# Patient Record
Sex: Female | Born: 1980 | Race: White | Hispanic: No | Marital: Single | Smoking: Former smoker
Health system: Southern US, Community
[De-identification: ages and names within clinical notes are randomized; demographics above are authoritative.]

## PROBLEM LIST (undated history)

## (undated) DIAGNOSIS — F32A Depression, unspecified: Secondary | ICD-10-CM

## (undated) DIAGNOSIS — F419 Anxiety disorder, unspecified: Secondary | ICD-10-CM

## (undated) DIAGNOSIS — K529 Noninfective gastroenteritis and colitis, unspecified: Secondary | ICD-10-CM

## (undated) DIAGNOSIS — D734 Cyst of spleen: Secondary | ICD-10-CM

## (undated) DIAGNOSIS — N83209 Unspecified ovarian cyst, unspecified side: Secondary | ICD-10-CM

## (undated) DIAGNOSIS — N289 Disorder of kidney and ureter, unspecified: Secondary | ICD-10-CM

## (undated) DIAGNOSIS — F329 Major depressive disorder, single episode, unspecified: Secondary | ICD-10-CM

## (undated) HISTORY — PX: OTHER SURGICAL HISTORY: SHX169

## (undated) HISTORY — PX: BACK SURGERY: SHX140

## (undated) HISTORY — PX: TUBAL LIGATION: SHX77

## (undated) HISTORY — PX: ABDOMINAL HYSTERECTOMY: SHX81

---

## 2005-02-18 ENCOUNTER — Other Ambulatory Visit: Admission: RE | Admit: 2005-02-18 | Discharge: 2005-02-18 | Payer: Self-pay | Admitting: Obstetrics and Gynecology

## 2005-08-18 ENCOUNTER — Inpatient Hospital Stay (HOSPITAL_COMMUNITY): Admission: AD | Admit: 2005-08-18 | Discharge: 2005-08-22 | Payer: Self-pay | Admitting: Obstetrics and Gynecology

## 2005-09-29 ENCOUNTER — Other Ambulatory Visit: Admission: RE | Admit: 2005-09-29 | Discharge: 2005-09-29 | Payer: Self-pay | Admitting: Obstetrics and Gynecology

## 2006-12-17 ENCOUNTER — Encounter: Admission: RE | Admit: 2006-12-17 | Discharge: 2006-12-17 | Payer: Self-pay | Admitting: Obstetrics and Gynecology

## 2008-11-11 ENCOUNTER — Inpatient Hospital Stay (HOSPITAL_COMMUNITY): Admission: AD | Admit: 2008-11-11 | Discharge: 2008-11-11 | Payer: Self-pay | Admitting: Obstetrics and Gynecology

## 2008-11-20 ENCOUNTER — Inpatient Hospital Stay (HOSPITAL_COMMUNITY): Admission: AD | Admit: 2008-11-20 | Discharge: 2008-11-23 | Payer: Self-pay | Admitting: Obstetrics and Gynecology

## 2010-02-18 LAB — HM PAP SMEAR: HM Pap smear: NEGATIVE

## 2011-01-13 LAB — CBC
HCT: 29.3 % — ABNORMAL LOW (ref 36.0–46.0)
HCT: 38.5 % (ref 36.0–46.0)
MCHC: 34.8 g/dL (ref 30.0–36.0)
MCHC: 35.3 g/dL (ref 30.0–36.0)
Platelets: 116 10*3/uL — ABNORMAL LOW (ref 150–400)
Platelets: 174 10*3/uL (ref 150–400)
RBC: 2.88 MIL/uL — ABNORMAL LOW (ref 3.87–5.11)
RBC: 3.82 MIL/uL — ABNORMAL LOW (ref 3.87–5.11)
RDW: 13.5 % (ref 11.5–15.5)
RDW: 13.6 % (ref 11.5–15.5)
WBC: 8.9 10*3/uL (ref 4.0–10.5)

## 2011-01-13 LAB — TYPE AND SCREEN
ABO/RH(D): A POS
Antibody Screen: NEGATIVE

## 2011-01-13 LAB — ABO/RH: ABO/RH(D): A POS

## 2011-01-13 LAB — RPR: RPR Ser Ql: NONREACTIVE

## 2011-02-10 NOTE — Op Note (Signed)
[name], Lindsey Sampson NO.:  [id number]   MEDICAL RECORD NO.:  [id number]          PATIENT TYPE:  INP   LOCATION:  9132                          FACILITY:  [facility]   PHYSICIAN:  Duke Salvia. Marcelle Overlie, M.D.DATE OF BIRTH:  25-May-1981   DATE OF PROCEDURE:  11/20/2008  DATE OF DISCHARGE:                               OPERATIVE REPORT   PREOPERATIVE DIAGNOSES:  1. Term intrauterine pregnancy.  2. Previous cesarean section.  3. Requests permanent sterilization.   POSTOPERATIVE DIAGNOSES:  1. Term intrauterine pregnancy.  2. Previous cesarean section.  3. Requests permanent sterilization.   PROCEDURES:  1. Repeat low transverse cesarean section.  2. Filshie clip tubal ligation.   SURGEON:  Duke Salvia. Marcelle Overlie, MD   ANESTHESIA:  Spinal.   COMPLICATIONS:  None.   DRAINS:  Foley catheter.   ESTIMATED BLOOD LOSS:  800 mL.   PROCEDURE AND FINDINGS:  The patient was taken to the operating room  after an adequate level of spinal anesthetic was obtained.  With the  patient in left tilt position, the abdomen was prepped and draped in  usual manner for sterile procedures.  Foley catheter was positioned  draining clear urine.  Transverse Pfannenstiel incision was made through  the old scar which was well healed.  This was carried down the fascia,  which was incised and extended transversely.  Rectus muscle divided in  midline.  Peritoneum entered superiorly without incident and extended  vertically.  Bladder blade was positioned.  The vesicouterine serosa was  then incised and the bladder was sharply and bluntly dissected below.  Transverse incision made in the lower segment which was moderately thin,  extended with blunt dissection, clear fluid noted.  The patient  delivered of a female Apgars 9 and 9 and 8 pounds 7 ounces.  The infant  was suctioned, cord clamped, and passed to pediatric team for further  care.  Placenta was delivered manually intact, uterus exteriorized,  cavity wiped clean with laparotomy pack, closure obtained with first  layer of 0 chromic in a locked fashion followed by an imbricating layer  of 0 chromic.  The incision was hemostatic.  The bladder flap area was  intact and hemostatic.  Tubes and ovaries were normal bilaterally.  Filshie clip was applied at the right angle, 2-3 cm from the cornu on  each side, completely engulfing the tube with excellent application.  Prior to closure, sponge, needle, and instrument counts reported as  correct x2.  Peritoneum closed with  running 2-0 Vicryl suture.  Rectus muscles were reapproximated in the  midline with 2-0 Vicryl interrupted sutures.  Fascia closed from  laterally to midline on either side with a 0 PDS suture.  Subcutaneous  tissue was hemostatic.  Clips and Steri-Strips used on the skin.  She  tolerated this well and went to recovery room in good condition.      Richard M. Marcelle Overlie, M.D.  Electronically Signed     RMH/MEDQ  D:  11/20/2008  T:  11/21/2008  Job:  [id number]

## 2011-02-10 NOTE — Discharge Summary (Signed)
Lindsey, Sampson NO.:  [id number]   MEDICAL RECORD NO.:  [id number]          PATIENT TYPE:  INP   LOCATION:  9132                          FACILITY:  [facility]   PHYSICIAN:  Duke Salvia. Marcelle Overlie, M.D.DATE OF BIRTH:  September 22, 1981   DATE OF ADMISSION:  11/20/2008  DATE OF DISCHARGE:  11/23/2008                               DISCHARGE SUMMARY   DIAGNOSES:  1. Intrauterine pregnancy at term.  2. Previous cesarean section, desires repeat.  3. Multiparity, desires permanent sterilization.   DISCHARGE DIAGNOSES:  1. Status post low transverse cesarean section.  2. A viable female infant.   PROCEDURE:  1. Repeat low transverse cesarean section.  2. Bilateral tubal ligation.   REASON FOR ADMISSION:  Please see written H and P.   HOSPITAL COURSE:  The patient is a 30 year old, gravida 2, para 1, that  was admitted to [facility] for scheduled cesarean  section.  The patient had had a previous cesarean delivery and desired  repeat.  Due to multiparity, the patient also requested permanent  sterilization.  In the morning of admission, the patient was taken to  the operating room, where spinal anesthesia was administered without  difficulty.  A low transverse incision was made with delivery of a  viable female infant, weighing 8 pounds 7 ounces with Apgars of 9 at 1-  minute and 9 at 5 minutes.  The patient tolerated the procedure well and  was taken to the recovery room in stable condition.  On postoperative  day #1, the patient was without complaint.  Vital signs stable.  She was  afebrile.  Fundus was firm and nontender.  Abdominal dressings noted to  be clean, dry, and intact.  On postoperative day #2, the patient was  without complaint.  Baby was in the NICU with respiratory difficulty  with O2 sats being lower.  Vital signs were stable.  She was afebrile.  Blood pressure was 110/69 to 98/64.  Abdomen was soft.  Fundus was firm  and nontender.  Incision  was clean, dry, and intact.  Laboratory  findings showed hemoglobin of 10.3, platelet count 116,000, blood type  was known to be A positive.  On postoperative day #3, the patient was  without complaint.  She anticipated baby discharge.  Vital signs were  stable.  She was afebrile.  Fundus was firm and nontender.  Incision was  clean, dry, and intact.  Staples were removed.  The patient was later  discharged to home.   CONDITION ON DISCHARGE:  Stable.   DIET:  Regular as tolerated.   ACTIVITY:  No heavy lifting, no driving x2 weeks, and no vaginal entry.   FOLLOWUP:  The patient is to follow up in the office in 1-2 weeks for  incision check.  She is to call for temperature greater than 100  degrees, persistent nausea, vomiting, heavy vaginal bleeding, and/or  redness, or drainage from the incisional site.   DISCHARGE MEDICATIONS:  1. Tylox #30 one p.o. every 4-6 hours p.r.n.  2. Motrin 600 mg every 6 hours.  3. Prenatal vitamins 1 p.o. daily.  4. Colace 1 p.o. daily.      Julio Sicks, N.P.      Richard M. Marcelle Overlie, M.D.  Electronically Signed    CC/MEDQ  D:  11/23/2008  T:  11/23/2008  Job:  [id number]

## 2011-02-10 NOTE — H&P (Signed)
NAMEJAYLENN, BAIZA NO.:  [id number]   MEDICAL RECORD NO.:  [id number]          PATIENT TYPE:  INP   LOCATION:  NA                            FACILITY[facility]  [facility]   PHYSICIAN:  Duke Salvia. Marcelle Overlie, M.D.DATE OF BIRTH:  15-Nov-1980   DATE OF ADMISSION:  11/20/2008  DATE OF DISCHARGE:                              HISTORY & PHYSICAL   CHIEF COMPLAINT:  For repeat cesarean section at term.   HPI:  A 30 year old G2, P1, previous cesarean section in 2006.  EDD is  November 26, 2008.  Presents at 39 weeks for repeat cesarean section.  This  procedure including risks related to bleeding, infection, adjacent organ  injury, transfusion all reviewed with her which she understands and  accepts.   Blood type is A+.  Her 1-hour GTT was normal.  Prenatal course has  otherwise been uncomplicated.   PAST MEDICAL HISTORY:   ALLERGIES:  NONE.   OPERATIONS:  Cesarean section.   REVIEW OF SYSTEMS:  Significant for a prior history of smoking.   FAMILY HISTORY:  Significant for a mother with lupus.   PHYSICAL EXAM:  Temp 98.2.  Blood pressure 120/78.  HEENT:  Unremarkable.  NECK:  Supple without masses.  LUNGS:  Clear.  CARDIOVASCULAR:  Rate and rhythm without murmurs, rubs, or gallops  noted.  BREASTS:  Not examined.  Term fundal height.  Fetal heart rate 140.  Cervix was closed.  EXTREMITIES/NEUROLOGIC EXAM:  Unremarkable.   IMPRESSION:  Term intrauterine pregnancy, previous cesarean section,  desires repeat and tubal ligation.  This procedure including permanence  of the tubal procedure, failure rated 2 to 3 per thousand all reviewed  with her which she understands and accepts along with additional risk as  noted as above.      Richard M. Marcelle Overlie, M.D.  Electronically Signed     RMH/MEDQ  D:  11/19/2008  T:  11/19/2008  Job:  [id number]

## 2011-02-13 NOTE — Discharge Summary (Signed)
Lindsey Sampson, Lindsey Sampson               ACCOUNT NO.:  [id number]   MEDICAL RECORD NO.:  [id number]          PATIENT TYPE:  INP   LOCATION:  9132                          [facility]  [facility]   PHYSICIAN:  Dineen Kid. Rana Snare, M.D.    DATE OF BIRTH:  [birth year]   DATE OF ADMISSION:  08/18/2005  DATE OF DISCHARGE:  08/22/2005                                 DISCHARGE SUMMARY   ADMITTING DIAGNOSES:  1.  Intrauterine pregnancy at 37 weeks estimated gestational age.  2.  Spontaneous onset of labor.   DISCHARGE DIAGNOSES:  1.  Status post low transverse cesarean section secondary to failure to      descend.  2.  Viable female infant.   PROCEDURE:  Primary low transverse cesarean section.   REASON FOR ADMISSION:  Please see written H&P.   HOSPITAL COURSE:  The patient was a 30 year old primigravida that was  admitted to [facility] at 37 weeks estimated gestational  age in active labor. Prenatal course had been uncomplicated. The patient was  noted to be positive for group B beta strep. On admission vital signs were  stable. Fetal heart tones reactive. Contraction pattern was noted to be  approximately every 2-4 minutes. Cervix was dilated to 4 cm, 80% effaced,  vertex at -2 station. IV antibiotics were administered for positive group B  beta strep culture. Artificial rupture of membranes was performed revealing  clear fluid. The patient was monitored closely throughout the day and on the  following morning the patient had noted to be in a good labor pattern;  however, cervix continued to be dilated to 4 cm, 80%, at a -2 station.  Vertex was noted to have molding and decision was made to proceed with a low  transverse cesarean section for failure to progress. The patient was then  transferred to the operating room where epidural was dosed to an adequate  surgical level. A low transverse incision was made with the delivery of a  viable female infant weighing 6 pounds 8 ounces with Apgars of  8 at one minute  and 9 at five minutes. The patient tolerated the procedure well and was  taken to the recovery room in stable condition. On postoperative day #1 the  patient was without complaint. Vital signs were stable. Abdomen was soft,  slightly distended, with positive bowel sounds. Abdominal dressing was noted  to be clean, dry and intact. Laboratory findings revealed hemoglobin of 9.3;  platelet count of 148,000; wbc count of 9.0. On postoperative day #2 the  patient was without complaint. Vital signs remained stable. She was  afebrile. Abdomen was soft, fundus firm and nontender. Abdominal dressing  had been removed revealing an incision that was clean, dry and intact. On  postoperative day #3 the patient did complain of some lower extremity edema.  Vital signs were stable. Deep tendon reflexes were 2+ without clonus, 2+  pitting edema was noted in bilateral lower extremities. Incision was clean,  dry and intact. Staples were removed and the patient was discharged home.   CONDITION ON DISCHARGE:  Stable.   DIET:  Regular as tolerated.   ACTIVITY:  No heavy lifting, no driving x2 weeks, no vaginal entry.   FOLLOW UP:  The patient is to follow up in the office in 1 week for incision  check. She is to call for temperature greater than 100 degrees, persistent  nausea or vomiting, heavy vaginal bleeding, and/or redness or drainage from  incisional site. The patient was also instructed call for headache, blurred  vision or right upper quadrant pain.   DISCHARGE MEDICATIONS:  1.  Tylox #30 one p.o. every 4-6 hours p.r.n.  2.  Lasix 20 mg one p.o. daily x3 days.  3.  Motrin 600 mg every 6 hours.  4.  Prenatal vitamins one p.o. daily.      Julio Sicks, N.P.      Dineen Kid Rana Snare, M.D.  Electronically Signed    CC/MEDQ  D:  10/09/2005  T:  10/09/2005  Job:  [id number]

## 2011-02-13 NOTE — Op Note (Signed)
NAMEJULEAH, Lindsey Sampson               ACCOUNT NO.:  [id number]   MEDICAL RECORD NO.:  [id number]          PATIENT TYPE:  INP   LOCATION:  9132                          [facility]ACILITY[facility]   PHYSICIAN:  Michelle L. Grewal, M.D.DATE OF BIRTH:  [birth year]   DATE OF PROCEDURE:  08/19/2005  DATE OF DISCHARGE:                                 OPERATIVE REPORT   PREOPERATIVE DIAGNOSIS:  Intrauterine pregnancy at term with failure to  progress.   POSTOPERATIVE DIAGNOSIS:  Intrauterine pregnancy at term with failure to  progress.   PROCEDURE:  Primary low transverse cesarean section.   SURGEON:  Michelle L. Vincente Poli, M.D.   ANESTHESIA:  Post anesthesia epidural.   SPECIMEN:  Female infant Apgars 8 at one minute and 9 at five minutes.   ESTIMATED BLOOD LOSS:  500 mL.   PROCEDURE:  Patient was taken to the operating room. She was dosed and found  to be adequate. She was prepped and draped. Foley catheter was already in  the bladder. A low transverse incision was made and carried down to the  fascia.  The fascia was scored in midline. The rectus muscles were separated  midline and peritoneum was entered bluntly. Peritoneal incision was then  stretched and lower uterine segment identified and the bladder flap was  created sharply and then digitally. The bladder blade was readjusted. Low  transverse incision was made and the uterus was entered using the hemostat.  The baby was delivered quite easily and was a female infant with Apgars of 8  at one minute and 9 at five minutes __________. The cord was clamped and  cut. The baby was handed to the awaiting neonatologist and the baby was then  taken to newborn nursery. The placenta was manually removed and noted to be  normal and intact with a three-vessel cord. The uterus was cleared of all  clots and debris. There was some small subserosal fibroids noted. The adnexa  were normal. The uterine incision was closed using 0 Vicryl in continuous  running stitch  and the hemostasis was noted. The irrigation was performed.  Hemostasis was noted. The peritoneum was closed using 0 Vicryl. The rectus  muscles were reapproximated and the fascia was closed using 0 Vicryl in  continuous running stitch. After irrigation the subcu was noted to be  hemostatic. The skin was closed with staples. All sponge, lap and instrument  counts were correct x2. The patient tolerated procedure well and went to  recovery room in stable condition.      Michelle L. Vincente Poli, M.D.  Electronically Signed     MLG/MEDQ  D:  08/19/2005  T:  08/20/2005  Job:  [id number]

## 2011-11-21 ENCOUNTER — Emergency Department: Payer: Self-pay | Admitting: Emergency Medicine

## 2012-12-02 ENCOUNTER — Emergency Department: Payer: Self-pay | Admitting: Emergency Medicine

## 2014-08-06 ENCOUNTER — Emergency Department (HOSPITAL_COMMUNITY): Payer: Medicaid Other

## 2014-08-06 ENCOUNTER — Emergency Department (HOSPITAL_COMMUNITY)
Admission: EM | Admit: 2014-08-06 | Discharge: 2014-08-06 | Disposition: A | Discharge status: Home / Self Care | Payer: Medicaid Other | Attending: Emergency Medicine | Admitting: Emergency Medicine

## 2014-08-06 ENCOUNTER — Encounter (HOSPITAL_COMMUNITY): Payer: Self-pay | Admitting: Emergency Medicine

## 2014-08-06 DIAGNOSIS — S3992XA Unspecified injury of lower back, initial encounter: Secondary | ICD-10-CM | POA: Insufficient documentation

## 2014-08-06 DIAGNOSIS — S199XXA Unspecified injury of neck, initial encounter: Secondary | ICD-10-CM | POA: Insufficient documentation

## 2014-08-06 DIAGNOSIS — Y92410 Unspecified street and highway as the place of occurrence of the external cause: Secondary | ICD-10-CM | POA: Insufficient documentation

## 2014-08-06 DIAGNOSIS — S6992XA Unspecified injury of left wrist, hand and finger(s), initial encounter: Secondary | ICD-10-CM | POA: Diagnosis not present

## 2014-08-06 DIAGNOSIS — Y998 Other external cause status: Secondary | ICD-10-CM | POA: Insufficient documentation

## 2014-08-06 DIAGNOSIS — Y9389 Activity, other specified: Secondary | ICD-10-CM | POA: Insufficient documentation

## 2014-08-06 MED ORDER — IBUPROFEN 600 MG PO TABS
600.0000 mg | ORAL_TABLET | Freq: Four times a day (QID) | ORAL | Status: DC | PRN
Start: 2014-08-06 — End: 2016-02-12

## 2014-08-06 MED ORDER — CYCLOBENZAPRINE HCL 10 MG PO TABS
10.0000 mg | ORAL_TABLET | Freq: Two times a day (BID) | ORAL | Status: DC | PRN
Start: 2014-08-06 — End: 2016-02-12

## 2014-08-06 MED ORDER — HYDROCODONE-ACETAMINOPHEN 5-325 MG PO TABS: 2.0000 | ORAL_TABLET | ORAL | Status: DC | PRN

## 2014-08-06 MED ORDER — HYDROCODONE-ACETAMINOPHEN 5-325 MG PO TABS
2.0000 | ORAL_TABLET | Freq: Once | ORAL | Status: AC
  Administered 2014-08-06: 2 via ORAL
  Filled 2014-08-06: qty 2

## 2014-08-06 NOTE — Discharge Instructions (Signed)
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
## 2014-12-12 ENCOUNTER — Ambulatory Visit: Payer: Self-pay | Admitting: Obstetrics and Gynecology

## 2014-12-17 ENCOUNTER — Ambulatory Visit: Payer: Self-pay | Admitting: Obstetrics and Gynecology

## 2015-01-21 LAB — SURGICAL PATHOLOGY

## 2015-01-27 NOTE — Op Note (Signed)
PATIENT NAME:  Lindsey Sampson, Lindsey Sampson MR#:  [id number] DATE OF BIRTH:  October 16, 1980  DATE OF PROCEDURE:  12/17/2014  PREOPERATIVE DIAGNOSES:  1.  Persistent right ovarian cyst.  2.  History tubal ligation, desiring tubal reversal.  POSTOPERATIVE DIAGNOSES:    1.  Persistent right ovarian cyst.  2.  History tubal ligation, desiring tubal reversal. 3.  Noted dislodged right Filshie clip. 4.  Also a few small bowel adhesions to the left adnexa.   OPERATION: Laparoscopic right ovarian cystectomy, retrieval of misplaced right Filshie clip and lysis of adhesions.   ANESTHESIA: General.   SURGEON: Rubie Maid, MD   ASSISTANT: Malachi Paradise, MD.   ESTIMATED BLOOD LOSS: Minimal.   OPERATIVE FLUIDS: 1000 mL.   URINE OUTPUT: 400 mL.   COMPLICATIONS: None.   FINDINGS: The uterus was sounded to 8 cm.  On laparoscopy, there were noted to be 2 right ovarian cysts; 1 was medium-sized, rupture was seen in light fluid containing hair, was approximately 4 cm in size and the other was a small simple cyst approximately 2 cm filled with serous fluid that was noted on aspiration.  There was also a Filshie clip noted to be missing from the right fallopian tube.  On survey of the abdomen, the Filshie clip was seen in the posterior cul-de-sac in a  defect noted along the left uterosacral ligament. The left fallopian tube was noted to have Filshie clip in place, otherwise normal-appearing fallopian tubes and normal left ovary. There were a few filmy adhesions of the bowel to the left adnexa.   SPECIMEN: Right ovarian cyst and the Filshie clip that was noted in the uterosacral ligament defect.   CONDITION: Stable.   PROCEDURE: The patient was taken to the Operating Room where she was placed under general anesthesia without difficulty. She was then placed in the dorsal lithotomy position and prepped and draped in normal sterile fashion for a laparoscopic procedure. Timeout was performed. A straight  catheterization was performed with a return of 400 mL of clear urine. Next, a sterile speculum was placed into the patient's vagina. A single-tooth tenaculum was used to grasp the anterior lip of the cervix.  Cervix was then sounded to 8 cm.  Next, the tenaculum was removed and a Hulka clamp was placed for uterine manipulation. At this time, attention was turned to the abdomen where an infraumbilical vertical incision was injected with 0.5% Sensorcaine with epinephrine and a skin incision was made.  Next, a 5 mm trocar with sheath was inserted into the patient's abdominal cavity under direct visualization using the laparoscope. Once entrance into the abdominal cavity had been confirmed, the abdomen was insufflated with carbon dioxide. After adequate insufflation, a survey of the abdomen was performed with the findings noted above. The upper abdomen was noted to be free of adhesions. Next, a 10 mm incision was made on the patient's right lower aspect of the abdomen.  Skin incision was injected with 0.5% Sensorcaine with epinephrine and a 10 mm trocar was introduced into the abdominal cavity for instrumentation under direct visualization. The same procedure was carried out on the patient's left side where a 5 mm skin incision was made and a 5 mm trocar was introduced, again under direct visualization. The uterus appeared to be normal. All other findings were noted above.  The cul-de-sac was clean without evidence of endometriosis, scarring or adhesions.   At this time, the right ovary was grasped and elevated at the level of the right fallopian tube and  a second grasper was used to offer counter-traction of the ovary where the cyst was incised using the Harmonic scalpel device to separate the ovarian capsule from the cyst wall. After linear incision was made, the capsule was teased away from ovarian cyst.  During the time of separation of these 2 layers, entrance was made into the cyst where sebaceous material was  noted.  Once the cyst had been ruptured, the suction irrigator was then inserted into the ovarian cyst cavity, and the remaining cyst fluid was suctioned.  Once the cyst had been drained, the ovarian capsule continued to be teased away from the cyst wall until the cyst had been completely dissected from the ovary.  Next, the ovarian cyst was placed into an Endo Catch bag and this bag was removed from the abdomen through the 10 mm trocar site. The specimen was then collected and sent to pathology for evaluation. The 10 mm trocar was then replaced into the patient's abdominal cavity, again under direct visualization. The remaining ovarian tissue was then surveyed.  It was noted that there was a second smaller cyst below the initial first cyst, as this was again aspirated with noted clear or serous fluid which drained.  After this, copious irrigation of the abdomen was performed with suction of the fluid.  Attention was turned to the left fallopian tube and ovary where there were noted to be a few filmy bowel adhesions. These adhesions were taken down using the Harmonic device.  The misplaced right Filshie clip was then retrieved from the defect noted in the uterosacral ligament and removed from the patient's abdomen. The left Filshie clip was noted to be in place. After this, the right ovary was then surveyed once more and was noted to be hemostatic. The lateral trocars were removed from the patient's abdomen.  The CO2 gas was allowed to express. After the gas had been expressed, the infraumbilical port was then removed. The 10 mm incision site was then closed using 4-0 Vicryl. All other incisions were closed using LiquiBand. The patient tolerated the procedure well. The Hulka clamp was removed from the patient's vagina. She was then awakened and taken to the PACU in stable condition.     _____________________________ Chesley Noon Marcelline Mates, MD asc:DT D: 12/17/2014 22:09:38 ET T: 12/18/2014 08:47:51  ET JOB#: [id number]  cc: Chesley Noon. Marcelline Mates, MD, <Dictator> Augusto Gamble MD ELECTRONICALLY SIGNED 01/07/2015 11:18

## 2015-05-13 ENCOUNTER — Emergency Department: Payer: Medicaid Other

## 2015-05-13 ENCOUNTER — Emergency Department
Admission: EM | Admit: 2015-05-13 | Discharge: 2015-05-13 | Disposition: A | Discharge status: Home / Self Care | Payer: Medicaid Other | Attending: Emergency Medicine | Admitting: Emergency Medicine

## 2015-05-13 DIAGNOSIS — Y9289 Other specified places as the place of occurrence of the external cause: Secondary | ICD-10-CM | POA: Diagnosis not present

## 2015-05-13 DIAGNOSIS — S93509A Unspecified sprain of unspecified toe(s), initial encounter: Secondary | ICD-10-CM

## 2015-05-13 DIAGNOSIS — Y9371 Activity, boxing: Secondary | ICD-10-CM | POA: Diagnosis not present

## 2015-05-13 DIAGNOSIS — S93501A Unspecified sprain of right great toe, initial encounter: Secondary | ICD-10-CM | POA: Insufficient documentation

## 2015-05-13 DIAGNOSIS — X58XXXA Exposure to other specified factors, initial encounter: Secondary | ICD-10-CM | POA: Insufficient documentation

## 2015-05-13 DIAGNOSIS — S99921A Unspecified injury of right foot, initial encounter: Secondary | ICD-10-CM | POA: Diagnosis present

## 2015-05-13 DIAGNOSIS — S90112A Contusion of left great toe without damage to nail, initial encounter: Secondary | ICD-10-CM | POA: Insufficient documentation

## 2015-05-13 DIAGNOSIS — S9031XA Contusion of right foot, initial encounter: Secondary | ICD-10-CM | POA: Insufficient documentation

## 2015-05-13 DIAGNOSIS — S99922A Unspecified injury of left foot, initial encounter: Secondary | ICD-10-CM | POA: Diagnosis not present

## 2015-05-13 DIAGNOSIS — Y998 Other external cause status: Secondary | ICD-10-CM | POA: Insufficient documentation

## 2015-05-13 MED ORDER — HYDROCODONE-ACETAMINOPHEN 5-325 MG PO TABS: 1.0000 | ORAL_TABLET | ORAL | Status: DC | PRN

## 2015-05-13 NOTE — ED Notes (Addendum)
Anterior right  foot ecchymosis after pt was using a kick bag on saturday

## 2015-05-13 NOTE — ED Notes (Signed)
Pt states she walked into a kick boxing bag on Sat. States the pain is worsening. Right toe pain.
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
## 2015-05-13 NOTE — ED Provider Notes (Signed)
[facility] Emergency Department Provider Note  ____________________________________________  Time seen: Approximately 2:52 PM  I have reviewed the triage vital signs and the nursing notes.   HISTORY  Chief Complaint Toe Pain    HPI Lindsey Sampson is a 34 y.o. female who presents to the emergency department for evaluation of right foot pain. She states that she was learning to kickbox on Saturday anddid it wrong. She states the pain is been worsening over the past 24 hours and she is unable to bend her great toe.   No past medical history on file.  There are no active problems to display for this patient.   No past surgical history on file.  Current Outpatient Rx  Name  Route  Sig  Dispense  Refill  . cyclobenzaprine (FLEXERIL) 10 MG tablet   Oral   Take 1 tablet (10 mg total) by mouth 2 (two) times daily as needed for muscle spasms.   20 tablet   0   . HYDROcodone-acetaminophen (NORCO/VICODIN) 5-325 MG per tablet   Oral   Take 1 tablet by mouth every 4 (four) hours as needed.   12 tablet   0   . ibuprofen (ADVIL,MOTRIN) 600 MG tablet   Oral   Take 1 tablet (600 mg total) by mouth every 6 (six) hours as needed.   30 tablet   0     Allergies Review of patient's allergies indicates no known allergies.  No family history on file.  Social History Social History  Substance Use Topics  . Smoking status: Passive Smoke Exposure - Never Smoker  . Smokeless tobacco: Not on file  . Alcohol Use: Not on file    Review of Systems Constitutional: No recent illness. Eyes: No visual changes. ENT: No sore throat. Cardiovascular: Denies chest pain or palpitations. Respiratory: Denies shortness of breath. Gastrointestinal: No abdominal pain.  Genitourinary: Negative for dysuria. Musculoskeletal: Pain in left great toe and foot. Skin: Negative for rash. Neurological: Negative for headaches, focal weakness or numbness. 10-point ROS otherwise  negative.  ____________________________________________   PHYSICAL EXAM:  VITAL SIGNS: ED Triage Vitals  Enc Vitals Group     BP 05/13/15 1349 135/114 mmHg     Pulse Rate 05/13/15 1349 75     Resp 05/13/15 1349 18     Temp 05/13/15 1349 98.3 F (36.8 C)     Temp Source 05/13/15 1349 Oral     SpO2 05/13/15 1349 95 %     Weight 05/13/15 1349 142 lb (64.411 kg)     Height 05/13/15 1349 5\' 4"  (1.626 m)     Head Cir --      Peak Flow --      Pain Score --      Pain Loc --      Pain Edu? --      Excl. in [place]? --     Constitutional: Alert and oriented. Well appearing and in no acute distress. Eyes: Conjunctivae are normal. EOMI. Head: Atraumatic. Nose: No congestion/rhinnorhea. Neck: No stridor.  Respiratory: Normal respiratory effort.   Musculoskeletal: Tenderness to palpation over the MCP joint of the left great toe. Tenderness also noted in the metatarsals of the first and second digits. Patient unwilling to attempt range of motion of the left great toe due to pain. Neurologic:  Normal speech and language. No gross focal neurologic deficits are appreciated. Speech is normal. No gait instability. Skin:  Skin is warm, dry and intact. Ecchymosis noted to the left  great toe and dorsal aspect of the left foot. Psychiatric: Mood and affect are normal. Speech and behavior are normal.  ____________________________________________   LABS (all labs ordered are listed, but only abnormal results are displayed)  Labs Reviewed - No data to display ____________________________________________  RADIOLOGY  Right foot negative for acute injury.  I, Sherrie George, personally viewed and evaluated these images (plain radiographs) as part of my medical decision making.   ____________________________________________   PROCEDURES  Procedure(s) performed: Cast shoe applied to right foot by tech.   ____________________________________________   INITIAL IMPRESSION / ASSESSMENT AND PLAN  / ED COURSE  Pertinent labs & imaging results that were available during my care of the patient were reviewed by me and considered in my medical decision making (see chart for details).  Patient was advised to follow up with the primary care provider for podiatrist for symptoms that are not improving over the next week. She was advised to return to the emergency department for symptoms that change or worsen if unable to schedule an appointment with the primary care provider or specialist. ____________________________________________   FINAL CLINICAL IMPRESSION(S) / ED DIAGNOSES  Final diagnoses:  Contusion, foot, right, initial encounter  Toe sprain, initial encounter       Victorino Dike, FNP 05/13/15 1532  Hinda Kehr, MD 05/13/15 1544

## 2015-05-13 NOTE — ED Notes (Signed)
Kickboxing few days ago has pain to left foot

## 2015-06-23 ENCOUNTER — Emergency Department: Payer: Medicaid Other

## 2015-06-23 ENCOUNTER — Emergency Department
Admission: EM | Admit: 2015-06-23 | Discharge: 2015-06-23 | Disposition: A | Discharge status: Home / Self Care | Payer: Medicaid Other | Attending: Emergency Medicine | Admitting: Emergency Medicine

## 2015-06-23 ENCOUNTER — Encounter: Payer: Self-pay | Admitting: Intensive Care

## 2015-06-23 DIAGNOSIS — Y9389 Activity, other specified: Secondary | ICD-10-CM | POA: Insufficient documentation

## 2015-06-23 DIAGNOSIS — S9031XA Contusion of right foot, initial encounter: Secondary | ICD-10-CM | POA: Insufficient documentation

## 2015-06-23 DIAGNOSIS — R51 Headache: Secondary | ICD-10-CM

## 2015-06-23 DIAGNOSIS — Z720 Tobacco use: Secondary | ICD-10-CM | POA: Insufficient documentation

## 2015-06-23 DIAGNOSIS — S40022A Contusion of left upper arm, initial encounter: Secondary | ICD-10-CM | POA: Diagnosis not present

## 2015-06-23 DIAGNOSIS — S0081XA Abrasion of other part of head, initial encounter: Secondary | ICD-10-CM | POA: Insufficient documentation

## 2015-06-23 DIAGNOSIS — S40021A Contusion of right upper arm, initial encounter: Secondary | ICD-10-CM | POA: Diagnosis not present

## 2015-06-23 DIAGNOSIS — Y9289 Other specified places as the place of occurrence of the external cause: Secondary | ICD-10-CM | POA: Diagnosis not present

## 2015-06-23 DIAGNOSIS — S9032XA Contusion of left foot, initial encounter: Secondary | ICD-10-CM | POA: Insufficient documentation

## 2015-06-23 DIAGNOSIS — Y998 Other external cause status: Secondary | ICD-10-CM | POA: Insufficient documentation

## 2015-06-23 DIAGNOSIS — S0990XA Unspecified injury of head, initial encounter: Secondary | ICD-10-CM | POA: Diagnosis present

## 2015-06-23 DIAGNOSIS — T07XXXA Unspecified multiple injuries, initial encounter: Secondary | ICD-10-CM

## 2015-06-23 DIAGNOSIS — R519 Headache, unspecified: Secondary | ICD-10-CM

## 2015-06-23 MED ORDER — ACETAMINOPHEN 500 MG PO TABS
1000.0000 mg | ORAL_TABLET | Freq: Four times a day (QID) | ORAL | Status: DC | PRN
  Administered 2015-06-23: 1000 mg via ORAL
  Filled 2015-06-23: qty 2

## 2015-06-23 MED ORDER — IBUPROFEN 200 MG PO TABS: 600.0000 mg | ORAL_TABLET | Freq: Four times a day (QID) | ORAL | Status: DC | PRN

## 2015-06-23 MED ORDER — OXYCODONE-ACETAMINOPHEN 5-325 MG PO TABS: 1.0000 | ORAL_TABLET | Freq: Four times a day (QID) | ORAL | Status: DC | PRN

## 2015-06-23 MED ORDER — OXYCODONE HCL 5 MG PO TABS
5.0000 mg | ORAL_TABLET | ORAL | Status: AC
  Administered 2015-06-23: 5 mg via ORAL
  Filled 2015-06-23: qty 1

## 2015-06-23 MED ORDER — ACETAMINOPHEN 325 MG PO TABS: 650.0000 mg | ORAL_TABLET | Freq: Once | ORAL | Status: DC
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
## 2016-02-12 ENCOUNTER — Ambulatory Visit (HOSPITAL_COMMUNITY)
Admission: EM | Admit: 2016-02-12 | Discharge: 2016-02-12 | Disposition: A | Discharge status: Home / Self Care | Payer: Medicaid Other | Attending: Emergency Medicine | Admitting: Emergency Medicine

## 2016-02-12 ENCOUNTER — Encounter (HOSPITAL_COMMUNITY): Payer: Self-pay | Admitting: Emergency Medicine

## 2016-02-12 DIAGNOSIS — M5432 Sciatica, left side: Secondary | ICD-10-CM | POA: Diagnosis not present

## 2016-02-12 DIAGNOSIS — G5702 Lesion of sciatic nerve, left lower limb: Secondary | ICD-10-CM | POA: Diagnosis not present

## 2016-02-12 MED ORDER — HYDROCODONE-ACETAMINOPHEN 5-325 MG PO TABS: 1.0000 | ORAL_TABLET | Freq: Four times a day (QID) | ORAL | Status: DC | PRN

## 2016-02-12 MED ORDER — GABAPENTIN 300 MG PO CAPS: 300.0000 mg | ORAL_CAPSULE | Freq: Every day | ORAL | Status: DC

## 2016-02-12 MED ORDER — CYCLOBENZAPRINE HCL 10 MG PO TABS: 10.0000 mg | ORAL_TABLET | Freq: Three times a day (TID) | ORAL | Status: DC | PRN

## 2016-02-12 MED ORDER — METHYLPREDNISOLONE ACETATE 80 MG/ML IJ SUSP
INTRAMUSCULAR | Status: AC
  Filled 2016-02-12: qty 1

## 2016-02-12 MED ORDER — METHYLPREDNISOLONE ACETATE 80 MG/ML IJ SUSP
80.0000 mg | Freq: Once | INTRAMUSCULAR | Status: AC
  Administered 2016-02-12: 80 mg via INTRAMUSCULAR
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
## 2016-02-18 ENCOUNTER — Telehealth (HOSPITAL_COMMUNITY): Payer: Self-pay | Admitting: Emergency Medicine

## 2016-02-18 NOTE — ED Notes (Signed)
Pt called wanting to let us know she has developed an allergic reaction to depo medrol she rec'd here at the [facility] on 5/17 Wanted to know what she should do... Adv pt that she needs to come in to the [facility] or f/u w/PCP for further eval... Unable to give medical adv over the phone Adv pt if sx are getting worse to go to ED.... Pt wanted to know the wait time now... Pt verb understanding.

## 2016-02-22 ENCOUNTER — Encounter (HOSPITAL_COMMUNITY): Payer: Self-pay | Admitting: *Deleted

## 2016-02-22 ENCOUNTER — Ambulatory Visit (HOSPITAL_COMMUNITY)
Admission: EM | Admit: 2016-02-22 | Discharge: 2016-02-22 | Disposition: A | Discharge status: Home / Self Care | Payer: Medicaid Other | Attending: Emergency Medicine | Admitting: Emergency Medicine

## 2016-02-22 DIAGNOSIS — T50905A Adverse effect of unspecified drugs, medicaments and biological substances, initial encounter: Secondary | ICD-10-CM

## 2016-02-22 DIAGNOSIS — G5702 Lesion of sciatic nerve, left lower limb: Secondary | ICD-10-CM | POA: Diagnosis not present

## 2016-02-22 DIAGNOSIS — T887XXA Unspecified adverse effect of drug or medicament, initial encounter: Secondary | ICD-10-CM

## 2016-02-22 MED ORDER — OXYCODONE-ACETAMINOPHEN 5-325 MG PO TABS: 2.0000 | ORAL_TABLET | ORAL | Status: DC | PRN

## 2016-02-22 NOTE — ED Provider Notes (Signed)
CSN: [id number]     Arrival date & time 02/22/16  1357 History   First MD Initiated Contact with Patient 02/22/16 1459     Chief Complaint  Patient presents with  . Rash  . Leg Pain   (Consider location/radiation/quality/duration/timing/severity/associated sxs/prior Treatment) HPI  She is a 35 year old woman here for follow-up of leg pain. I saw her at the urgent care 10 days ago. At that time she was diagnosed with left piriformis syndrome and started on gabapentin, Flexeril and hydrocodone. She also received a Depo-Medrol injection. She states the Flexeril and hydrocodone are not doing anything. She states the gabapentin helps her sleep a little bit, but does not seem to help with pain. She has been doing ice massage, which is uncomfortable, but does seem to help. She is also continued to use icy hot with some improvement. 4 days ago, she developed a dry, itchy/burning rash to her face and neck. She is concerned that this is a reaction to the medication. No wheezing or throat swelling.  History reviewed. No pertinent past medical history. Past Surgical History  Procedure Laterality Date  . Cesarean section     History reviewed. No pertinent family history. Social History  Substance Use Topics  . Smoking status: Current Every Day Smoker -- 1.00 packs/day    Types: Cigarettes  . Smokeless tobacco: Never Used  . Alcohol Use: No   OB History    No data available     Review of Systems As in history of present illness Allergies  Prednisone  Home Medications   Prior to Admission medications   Medication Sig Start Date End Date Taking? Authorizing Provider  oxyCODONE-acetaminophen (PERCOCET/ROXICET) 5-325 MG tablet Take 2 tablets by mouth every 4 (four) hours as needed for severe pain. 02/22/16   Melony Overly, MD   Meds Ordered and Administered this Visit  Medications - No data to display  BP 114/71 mmHg  Pulse 78  Temp(Src) 98.4 F (36.9 C) (Oral)  Resp 17  SpO2 100%  LMP  02/05/2016 (Within Days) No data found.   Physical Exam  Constitutional: She is oriented to person, place, and time. She appears well-developed and well-nourished. No distress.  Cardiovascular: Normal rate, regular rhythm and normal heart sounds.   No murmur heard. Pulmonary/Chest: Effort normal and breath sounds normal. No respiratory distress. She has no wheezes. She has no rales.  Musculoskeletal:  Repeat exam deferred due to discomfort.  Neurological: She is alert and oriented to person, place, and time.    ED Course  Procedures (including critical care time)  Labs Review Labs Reviewed - No data to display  Imaging Review No results found.  MDM   1. Piriformis syndrome, left   2. Drug reaction, initial encounter    I put in a referral to physical therapy for the piriformis syndrome. I've recommended stopping the Flexeril and gabapentin. I suspect the gabapentin is causing the rash. Recommended OTC capsaicin cream 3-4 times a day. Prescription provided for 15 tablets of Percocet to use as needed for severe pain. Recommended OTC allergy medicine and Benadryl for the rash. Also recommended coconut oil. If pain is not improving over the next week, please follow-up with orthopedics.    Melony Overly, MD 02/22/16 [phone]

## 2016-02-22 NOTE — ED Notes (Addendum)
Patient reports continued pain down left leg, no relief with gabapentin and prednisone injection. Patient also with rash to face, describes as a tightness and burning sensation, after prednisone injection 4 days ago. Patient ambulatory. Airway intact.

## 2016-02-22 NOTE — Discharge Instructions (Signed)
I think the rash is likely a reaction to gabapentin. Please stop the gabapentin. Take an allergy pill such as Claritin or Zyrtec once a day for the next week. Use Benadryl as needed. Apply a thick lotion or coconut oil to the dry skin.  For your leg, please continue the ice massage. I have put in a referral to physical therapy. They should call you in the next week. Continue the icy hot as needed. You can get Capsaicin cream over-the-counter. Use this 3-4 times a day. Use the Percocet every 4-6 hours as needed for severe pain. If this is not getting better, please follow-up with Dr. Berenice Primas in orthopedics.

## 2016-03-04 ENCOUNTER — Ambulatory Visit: Payer: No Typology Code available for payment source

## 2016-03-05 ENCOUNTER — Ambulatory Visit: Payer: No Typology Code available for payment source | Admitting: Physical Therapy

## 2016-04-04 ENCOUNTER — Emergency Department (HOSPITAL_COMMUNITY): Payer: Medicaid Other

## 2016-04-04 ENCOUNTER — Emergency Department (HOSPITAL_COMMUNITY)
Admission: EM | Admit: 2016-04-04 | Discharge: 2016-04-04 | Disposition: A | Discharge status: Home / Self Care | Payer: Medicaid Other | Attending: Emergency Medicine | Admitting: Emergency Medicine

## 2016-04-04 ENCOUNTER — Encounter (HOSPITAL_COMMUNITY): Payer: Self-pay

## 2016-04-04 DIAGNOSIS — F17210 Nicotine dependence, cigarettes, uncomplicated: Secondary | ICD-10-CM | POA: Insufficient documentation

## 2016-04-04 DIAGNOSIS — M25552 Pain in left hip: Secondary | ICD-10-CM | POA: Diagnosis present

## 2016-04-04 DIAGNOSIS — M25559 Pain in unspecified hip: Secondary | ICD-10-CM

## 2016-04-04 DIAGNOSIS — Z79899 Other long term (current) drug therapy: Secondary | ICD-10-CM | POA: Diagnosis not present

## 2016-04-04 HISTORY — DX: Noninfective gastroenteritis and colitis, unspecified: K52.9

## 2016-04-04 MED ORDER — KETOROLAC TROMETHAMINE 60 MG/2ML IM SOLN
60.0000 mg | Freq: Once | INTRAMUSCULAR | Status: AC
  Administered 2016-04-04: 60 mg via INTRAMUSCULAR
  Filled 2016-04-04: qty 2

## 2016-04-04 MED ORDER — HYDROMORPHONE HCL 1 MG/ML IJ SOLN
1.0000 mg | Freq: Once | INTRAMUSCULAR | Status: AC
  Administered 2016-04-04: 1 mg via INTRAMUSCULAR
  Filled 2016-04-04: qty 1

## 2016-04-04 MED ORDER — ORPHENADRINE CITRATE ER 100 MG PO TB12: 100.0000 mg | ORAL_TABLET | Freq: Two times a day (BID) | ORAL | Status: DC

## 2016-04-04 MED ORDER — TRAMADOL HCL 50 MG PO TABS: 100.0000 mg | ORAL_TABLET | Freq: Four times a day (QID) | ORAL | Status: DC | PRN

## 2016-04-04 NOTE — Discharge Instructions (Signed)
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
## 2016-04-04 NOTE — ED Notes (Signed)
Patient is in MRI.  

## 2016-04-04 NOTE — ED Provider Notes (Signed)
9:28 AM Patient aware of all findings. Patient will follow up with orthopedics.   Carmin Muskrat, MD 04/04/16 [phone]
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
## 2016-04-17 ENCOUNTER — Emergency Department (HOSPITAL_COMMUNITY)
Admission: EM | Admit: 2016-04-17 | Discharge: 2016-04-17 | Disposition: A | Discharge status: Home / Self Care | Payer: Medicaid Other | Source: Home / Self Care

## 2016-04-17 ENCOUNTER — Encounter (HOSPITAL_COMMUNITY): Payer: Self-pay | Admitting: *Deleted

## 2016-04-17 ENCOUNTER — Emergency Department (HOSPITAL_COMMUNITY): Payer: Medicaid Other

## 2016-04-17 ENCOUNTER — Encounter (HOSPITAL_COMMUNITY): Payer: Self-pay | Admitting: Emergency Medicine

## 2016-04-17 ENCOUNTER — Emergency Department (HOSPITAL_COMMUNITY)
Admission: EM | Admit: 2016-04-17 | Discharge: 2016-04-17 | Disposition: A | Discharge status: Home / Self Care | Payer: Medicaid Other | Attending: Emergency Medicine | Admitting: Emergency Medicine

## 2016-04-17 DIAGNOSIS — Y929 Unspecified place or not applicable: Secondary | ICD-10-CM | POA: Insufficient documentation

## 2016-04-17 DIAGNOSIS — F17210 Nicotine dependence, cigarettes, uncomplicated: Secondary | ICD-10-CM | POA: Insufficient documentation

## 2016-04-17 DIAGNOSIS — Y999 Unspecified external cause status: Secondary | ICD-10-CM | POA: Insufficient documentation

## 2016-04-17 DIAGNOSIS — Z5321 Procedure and treatment not carried out due to patient leaving prior to being seen by health care provider: Secondary | ICD-10-CM | POA: Insufficient documentation

## 2016-04-17 DIAGNOSIS — Y939 Activity, unspecified: Secondary | ICD-10-CM

## 2016-04-17 DIAGNOSIS — M79605 Pain in left leg: Secondary | ICD-10-CM

## 2016-04-17 DIAGNOSIS — M5416 Radiculopathy, lumbar region: Secondary | ICD-10-CM | POA: Insufficient documentation

## 2016-04-17 LAB — PREGNANCY, URINE: PREG TEST UR: NEGATIVE

## 2016-04-17 MED ORDER — KETOROLAC TROMETHAMINE 60 MG/2ML IM SOLN
60.0000 mg | Freq: Once | INTRAMUSCULAR | Status: AC
  Administered 2016-04-17: 60 mg via INTRAMUSCULAR
  Filled 2016-04-17: qty 2

## 2016-04-17 MED ORDER — HYDROCODONE-ACETAMINOPHEN 5-325 MG PO TABS
2.0000 | ORAL_TABLET | Freq: Once | ORAL | Status: AC
  Administered 2016-04-17: 2 via ORAL
  Filled 2016-04-17: qty 2

## 2016-04-17 MED ORDER — TRAMADOL HCL 50 MG PO TABS: 50.0000 mg | ORAL_TABLET | Freq: Two times a day (BID) | ORAL | Status: DC | PRN

## 2016-04-17 NOTE — Discharge Instructions (Signed)
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
## 2016-04-17 NOTE — ED Notes (Signed)
Pt at the desk stating they are leaving due to wait, pt encouraged to stay, pt left stickers and BP cuff at the desk and was seen walking out the lobby.

## 2016-05-07 ENCOUNTER — Ambulatory Visit: Payer: Self-pay | Admitting: Physician Assistant

## 2016-05-12 ENCOUNTER — Encounter (HOSPITAL_COMMUNITY)
Admission: RE | Admit: 2016-05-12 | Discharge: 2016-05-12 | Disposition: A | Discharge status: Home / Self Care | Payer: Medicaid Other | Source: Ambulatory Visit | Attending: Orthopedic Surgery | Admitting: Orthopedic Surgery

## 2016-05-12 ENCOUNTER — Encounter (HOSPITAL_COMMUNITY): Payer: Self-pay

## 2016-05-12 DIAGNOSIS — Z79899 Other long term (current) drug therapy: Secondary | ICD-10-CM | POA: Diagnosis not present

## 2016-05-12 DIAGNOSIS — F419 Anxiety disorder, unspecified: Secondary | ICD-10-CM | POA: Diagnosis not present

## 2016-05-12 DIAGNOSIS — F329 Major depressive disorder, single episode, unspecified: Secondary | ICD-10-CM | POA: Diagnosis not present

## 2016-05-12 DIAGNOSIS — M5126 Other intervertebral disc displacement, lumbar region: Secondary | ICD-10-CM | POA: Diagnosis not present

## 2016-05-12 DIAGNOSIS — Z01818 Encounter for other preprocedural examination: Secondary | ICD-10-CM | POA: Insufficient documentation

## 2016-05-12 DIAGNOSIS — Z01812 Encounter for preprocedural laboratory examination: Secondary | ICD-10-CM | POA: Diagnosis not present

## 2016-05-12 HISTORY — DX: Major depressive disorder, single episode, unspecified: F32.9

## 2016-05-12 HISTORY — DX: Depression, unspecified: F32.A

## 2016-05-12 HISTORY — DX: Anxiety disorder, unspecified: F41.9

## 2016-05-12 LAB — SURGICAL PCR SCREEN
MRSA, PCR: NEGATIVE
Staphylococcus aureus: NEGATIVE

## 2016-05-12 LAB — CBC
HEMATOCRIT: 36.9 % (ref 36.0–46.0)
Hemoglobin: 12.5 g/dL (ref 12.0–15.0)
MCH: 32.8 pg (ref 26.0–34.0)
MCHC: 33.9 g/dL (ref 30.0–36.0)
MCV: 96.9 fL (ref 78.0–100.0)
Platelets: 235 10*3/uL (ref 150–400)
RBC: 3.81 MIL/uL — ABNORMAL LOW (ref 3.87–5.11)
RDW: 12.4 % (ref 11.5–15.5)
WBC: 6 10*3/uL (ref 4.0–10.5)

## 2016-05-12 LAB — HCG, SERUM, QUALITATIVE: Preg, Serum: NEGATIVE

## 2016-05-12 NOTE — Pre-Procedure Instructions (Addendum)
ELMYRA FERRELLI  05/12/2016  [facility] [address]      [id number] [place] [place] [address] [place] [address] Phone: [phone] Fax: [phone]  [id number] [address] [place] [address] Phone: [phone] Fax: [phone]    Your procedure is scheduled on Aug 17  Report to [facility] at 1100 A.M.  Call this number if you have problems the morning of surgery:  [phone]   Remember:  Do not eat food or drink liquids after midnight.  Take these medicines the morning of surgery with A SIP OF WATER Clonazepam (Klonopin) if needed, Trintellix   Stop taking aspirin, BC's, Goody's, Herbal medications, Fish Oil, Ibuprofen, Advil, Motrin, Aleve, Vitamins   Do not wear jewelry, make-up or nail polish.  Do not wear lotions, powders, or perfumes.  You may wear deoderant.  Do not shave 48 hours prior to surgery.  Men may shave face and neck.  Do not bring valuables to the hospital.  [facility] is not responsible for any belongings or valuables.  Contacts, dentures or bridgework may not be worn into surgery.  Leave your suitcase in the car.  After surgery it may be brought to your room.  For patients admitted to the hospital, discharge time will be determined by your treatment team.  Patients discharged the day of surgery will not be allowed to drive home.   Special instructions:  [facility] - Preparing for Surgery  Before surgery, you can play an important role.  Because skin is not sterile, your skin needs to be as free of germs as possible.  You can reduce the number of germs on you skin by washing with CHG (chlorahexidine gluconate) soap before surgery.  CHG is an antiseptic cleaner which kills germs and bonds with the skin to continue killing germs even after washing.  Please DO NOT use if you have an allergy to CHG or antibacterial soaps.  If your skin  becomes reddened/irritated stop using the CHG and inform your nurse when you arrive at Short Stay.  Do not shave (including legs and underarms) for at least 48 hours prior to the first CHG shower.  You may shave your face.  Please follow these instructions carefully:   1.  Shower with CHG Soap the night before surgery and the    morning of Surgery.  2.  If you choose to wash your hair, wash your hair first as usual with your   normal shampoo.  3.  After you shampoo, rinse your hair and body thoroughly to remove the Shampoo.  4.  Use CHG as you would any other liquid soap.  You can apply chg directly to the skin and wash gently with scrungie or a clean washcloth.  5.  Apply the CHG Soap to your body ONLY FROM THE NECK DOWN.   Do not use on open wounds or open sores.  Avoid contact with your eyes,  ears, mouth and genitals (private parts).  Wash genitals (private parts) with your normal soap.  6.  Wash thoroughly, paying special attention to the area where your surgery   will be performed.  7.  Thoroughly rinse your body with warm water from the neck down.  8.  DO NOT shower/wash with your normal soap after using and rinsing off the CHG Soap.  9.  Pat yourself dry with a clean towel.  10.  Wear clean pajamas.            11.  Place clean sheets on your bed the night of your first shower and do not sleep with pets.  Day of Surgery  Do not apply any lotions/deoderants the morning of surgery.  Please wear clean clothes to the hospital/surgery center.     Please read over the following fact sheets that you were given. Pain Booklet, Coughing and Deep Breathing, MRSA Information and Surgical Site Infection Prevention

## 2016-05-12 NOTE — Progress Notes (Signed)
Denies having a PCP Denies ever seeing a cardiologist. Denies ever having a card cath, stress test, or echo  Denies having any chest pain. Instructed not to smoke on the  Day of surgery, voices understanding.

## 2016-05-13 MED ORDER — CEFAZOLIN SODIUM-DEXTROSE 2-4 GM/100ML-% IV SOLN
2.0000 g | INTRAVENOUS | Status: AC
  Administered 2016-05-14: 2 g via INTRAVENOUS
  Filled 2016-05-13: qty 100

## 2016-05-13 NOTE — Progress Notes (Signed)
Anesthesia Chart Review: Per surgeon request.   Patient is a 35 year old female scheduled for left L4-5 discectomy on 8/17/1/7 by Dr. Rolena Infante.  History includes smoking, colitis, anxiety, depression. No PCP.  Meds include Klonopin, Trintellix.   Preoperative CBC noted. Serum pregnancy test negative.  Anticipate that she can proceed as planned.  George Hugh [facility]/Anesthesiology Phone [phone] 05/13/2016 9:16 AM

## 2016-05-13 NOTE — Anesthesia Preprocedure Evaluation (Addendum)
Anesthesia Evaluation  Patient identified by MRN, date of birth, ID band Patient awake    Reviewed: Allergy & Precautions, NPO status , Patient's Chart, lab work & pertinent test results  History of Anesthesia Complications Negative for: history of anesthetic complications  Airway Mallampati: II  TM Distance: >3 FB Neck ROM: Full    Dental  (+) Teeth Intact, Dental Advisory Given   Pulmonary neg shortness of breath, neg sleep apnea, neg COPD, neg recent URI, Current Smoker,    Pulmonary exam normal breath sounds clear to auscultation       Cardiovascular (-) hypertension(-) angina(-) Past MI, (-) Cardiac Stents, (-) CABG, (-) Orthopnea and (-) PND  Rhythm:Regular Rate:Normal     Neuro/Psych PSYCHIATRIC DISORDERS Anxiety Depression    GI/Hepatic Neg liver ROS, neg GERD  ,colitis   Endo/Other    Renal/GU negative Renal ROS     Musculoskeletal   Abdominal (+) - obese,   Peds  Hematology negative hematology ROS (+)   Anesthesia Other Findings   Reproductive/Obstetrics                            Anesthesia Physical Anesthesia Plan  ASA: I  Anesthesia Plan: General   Post-op Pain Management:    Induction: Intravenous  Airway Management Planned: Oral ETT  Additional Equipment:   Intra-op Plan:   Post-operative Plan: Extubation in OR  Informed Consent: I have reviewed the patients History and Physical, chart, labs and discussed the procedure including the risks, benefits and alternatives for the proposed anesthesia with the patient or authorized representative who has indicated his/her understanding and acceptance.   Dental advisory given  Plan Discussed with:   Anesthesia Plan Comments:        Anesthesia Quick Evaluation

## 2016-05-14 ENCOUNTER — Ambulatory Visit (HOSPITAL_COMMUNITY): Payer: Medicaid Other

## 2016-05-14 ENCOUNTER — Ambulatory Visit (HOSPITAL_COMMUNITY): Payer: Medicaid Other | Admitting: Anesthesiology

## 2016-05-14 ENCOUNTER — Encounter (HOSPITAL_COMMUNITY)
Admission: RE | Disposition: A | Discharge status: Home / Self Care | Payer: Self-pay | Source: Ambulatory Visit | Attending: Orthopedic Surgery

## 2016-05-14 ENCOUNTER — Ambulatory Visit (HOSPITAL_COMMUNITY): Payer: Medicaid Other | Admitting: Vascular Surgery

## 2016-05-14 ENCOUNTER — Observation Stay (HOSPITAL_COMMUNITY)
Admission: RE | Admit: 2016-05-14 | Discharge: 2016-05-15 | Disposition: A | Discharge status: Home / Self Care | Payer: Medicaid Other | Source: Ambulatory Visit | Attending: Orthopedic Surgery | Admitting: Orthopedic Surgery

## 2016-05-14 DIAGNOSIS — R531 Weakness: Secondary | ICD-10-CM | POA: Diagnosis not present

## 2016-05-14 DIAGNOSIS — F17210 Nicotine dependence, cigarettes, uncomplicated: Secondary | ICD-10-CM | POA: Diagnosis not present

## 2016-05-14 DIAGNOSIS — F329 Major depressive disorder, single episode, unspecified: Secondary | ICD-10-CM | POA: Insufficient documentation

## 2016-05-14 DIAGNOSIS — M5126 Other intervertebral disc displacement, lumbar region: Secondary | ICD-10-CM | POA: Diagnosis present

## 2016-05-14 DIAGNOSIS — M5116 Intervertebral disc disorders with radiculopathy, lumbar region: Secondary | ICD-10-CM | POA: Diagnosis not present

## 2016-05-14 DIAGNOSIS — R262 Difficulty in walking, not elsewhere classified: Secondary | ICD-10-CM | POA: Diagnosis not present

## 2016-05-14 DIAGNOSIS — Z8249 Family history of ischemic heart disease and other diseases of the circulatory system: Secondary | ICD-10-CM | POA: Insufficient documentation

## 2016-05-14 DIAGNOSIS — Z419 Encounter for procedure for purposes other than remedying health state, unspecified: Secondary | ICD-10-CM

## 2016-05-14 DIAGNOSIS — F419 Anxiety disorder, unspecified: Secondary | ICD-10-CM | POA: Diagnosis not present

## 2016-05-14 HISTORY — PX: LUMBAR LAMINECTOMY/DECOMPRESSION MICRODISCECTOMY: SHX5026

## 2016-05-14 SURGERY — LUMBAR LAMINECTOMY/DECOMPRESSION MICRODISCECTOMY 1 LEVEL
Anesthesia: General | Laterality: Left

## 2016-05-14 MED ORDER — CLONAZEPAM 0.5 MG PO TABS
0.5000 mg | ORAL_TABLET | Freq: Three times a day (TID) | ORAL | Status: DC | PRN
  Administered 2016-05-14 – 2016-05-15 (×2): 0.5 mg via ORAL
  Filled 2016-05-14 (×2): qty 1

## 2016-05-14 MED ORDER — OXYCODONE HCL 5 MG PO TABS
10.0000 mg | ORAL_TABLET | ORAL | Status: DC | PRN
  Administered 2016-05-14 – 2016-05-15 (×5): 10 mg via ORAL
  Filled 2016-05-14 (×6): qty 2

## 2016-05-14 MED ORDER — THROMBIN 20000 UNITS EX SOLR
CUTANEOUS | Status: AC
  Filled 2016-05-14: qty 20000

## 2016-05-14 MED ORDER — FENTANYL CITRATE (PF) 100 MCG/2ML IJ SOLN
INTRAMUSCULAR | Status: AC
  Filled 2016-05-14: qty 2

## 2016-05-14 MED ORDER — BUPIVACAINE-EPINEPHRINE (PF) 0.25% -1:200000 IJ SOLN
INTRAMUSCULAR | Status: AC
  Filled 2016-05-14: qty 30

## 2016-05-14 MED ORDER — MORPHINE SULFATE (PF) 2 MG/ML IV SOLN
1.0000 mg | INTRAVENOUS | Status: DC | PRN
  Administered 2016-05-14 (×4): 2 mg via INTRAVENOUS
  Filled 2016-05-14: qty 1
  Filled 2016-05-14 (×2): qty 2
  Filled 2016-05-14: qty 1

## 2016-05-14 MED ORDER — NEOSTIGMINE METHYLSULFATE 5 MG/5ML IV SOSY
PREFILLED_SYRINGE | INTRAVENOUS | Status: AC
  Filled 2016-05-14: qty 5

## 2016-05-14 MED ORDER — ACETAMINOPHEN 10 MG/ML IV SOLN
1000.0000 mg | Freq: Once | INTRAVENOUS | Status: AC
  Administered 2016-05-14: 1000 mg via INTRAVENOUS
  Filled 2016-05-14: qty 100

## 2016-05-14 MED ORDER — ONDANSETRON HCL 4 MG/2ML IJ SOLN
INTRAMUSCULAR | Status: DC | PRN
  Administered 2016-05-14: 4 mg via INTRAVENOUS

## 2016-05-14 MED ORDER — SODIUM CHLORIDE 0.9% FLUSH: 3.0000 mL | Freq: Two times a day (BID) | INTRAVENOUS | Status: DC

## 2016-05-14 MED ORDER — MIDAZOLAM HCL 2 MG/2ML IJ SOLN
INTRAMUSCULAR | Status: AC
  Filled 2016-05-14: qty 2

## 2016-05-14 MED ORDER — PROPOFOL 10 MG/ML IV BOLUS
INTRAVENOUS | Status: DC | PRN
  Administered 2016-05-14: 120 mg via INTRAVENOUS

## 2016-05-14 MED ORDER — NEOSTIGMINE METHYLSULFATE 10 MG/10ML IV SOLN
INTRAVENOUS | Status: DC | PRN
  Administered 2016-05-14: 3 mg via INTRAVENOUS

## 2016-05-14 MED ORDER — OXYCODONE-ACETAMINOPHEN 10-325 MG PO TABS: 1.0000 | ORAL_TABLET | ORAL | 0 refills | Status: DC | PRN

## 2016-05-14 MED ORDER — MIDAZOLAM HCL 5 MG/5ML IJ SOLN
INTRAMUSCULAR | Status: DC | PRN
  Administered 2016-05-14: 2 mg via INTRAVENOUS

## 2016-05-14 MED ORDER — NICOTINE 14 MG/24HR TD PT24
14.0000 mg | MEDICATED_PATCH | Freq: Every day | TRANSDERMAL | Status: DC
  Administered 2016-05-14 – 2016-05-15 (×2): 14 mg via TRANSDERMAL
  Filled 2016-05-14 (×2): qty 1

## 2016-05-14 MED ORDER — MORPHINE SULFATE (PF) 2 MG/ML IV SOLN
INTRAVENOUS | Status: AC
  Filled 2016-05-14: qty 1

## 2016-05-14 MED ORDER — 0.9 % SODIUM CHLORIDE (POUR BTL) OPTIME
TOPICAL | Status: DC | PRN
  Administered 2016-05-14: 1000 mL

## 2016-05-14 MED ORDER — FENTANYL CITRATE (PF) 100 MCG/2ML IJ SOLN
25.0000 ug | INTRAMUSCULAR | Status: DC | PRN
  Administered 2016-05-14: 50 ug via INTRAVENOUS

## 2016-05-14 MED ORDER — MIDAZOLAM HCL 2 MG/2ML IJ SOLN
1.0000 mg | Freq: Once | INTRAMUSCULAR | Status: AC
  Administered 2016-05-14: 1 mg via INTRAVENOUS

## 2016-05-14 MED ORDER — CEFAZOLIN IN D5W 1 GM/50ML IV SOLN
1.0000 g | Freq: Three times a day (TID) | INTRAVENOUS | Status: AC
  Administered 2016-05-14 – 2016-05-15 (×2): 1 g via INTRAVENOUS
  Filled 2016-05-14 (×2): qty 50

## 2016-05-14 MED ORDER — SODIUM CHLORIDE 0.9% FLUSH: 3.0000 mL | INTRAVENOUS | Status: DC | PRN

## 2016-05-14 MED ORDER — ROCURONIUM 10MG/ML (10ML) SYRINGE FOR MEDFUSION PUMP - OPTIME
INTRAVENOUS | Status: DC | PRN
  Administered 2016-05-14: 30 mg via INTRAVENOUS
  Administered 2016-05-14: 20 mg via INTRAVENOUS

## 2016-05-14 MED ORDER — FENTANYL CITRATE (PF) 250 MCG/5ML IJ SOLN
INTRAMUSCULAR | Status: DC | PRN
  Administered 2016-05-14 (×2): 100 ug via INTRAVENOUS

## 2016-05-14 MED ORDER — ONDANSETRON HCL 4 MG PO TABS: 4.0000 mg | ORAL_TABLET | Freq: Three times a day (TID) | ORAL | 0 refills | Status: DC | PRN

## 2016-05-14 MED ORDER — ONDANSETRON HCL 4 MG/2ML IJ SOLN: 4.0000 mg | INTRAMUSCULAR | Status: DC | PRN

## 2016-05-14 MED ORDER — OXYCODONE HCL 5 MG PO TABS
ORAL_TABLET | ORAL | Status: AC
  Filled 2016-05-14: qty 2

## 2016-05-14 MED ORDER — METHOCARBAMOL 500 MG PO TABS
500.0000 mg | ORAL_TABLET | Freq: Four times a day (QID) | ORAL | Status: DC | PRN
  Administered 2016-05-14 – 2016-05-15 (×2): 500 mg via ORAL
  Filled 2016-05-14 (×3): qty 1

## 2016-05-14 MED ORDER — HYDROMORPHONE HCL 1 MG/ML IJ SOLN
1.0000 mg | INTRAMUSCULAR | Status: DC | PRN
  Administered 2016-05-14 – 2016-05-15 (×2): 1 mg via INTRAVENOUS
  Filled 2016-05-14 (×2): qty 1

## 2016-05-14 MED ORDER — LACTATED RINGERS IV SOLN
INTRAVENOUS | Status: DC
  Administered 2016-05-14: 14:00:00 via INTRAVENOUS
  Administered 2016-05-14: 50 mL/h via INTRAVENOUS

## 2016-05-14 MED ORDER — BUPIVACAINE-EPINEPHRINE 0.25% -1:200000 IJ SOLN
INTRAMUSCULAR | Status: DC | PRN
  Administered 2016-05-14: 10 mL

## 2016-05-14 MED ORDER — ACETAMINOPHEN 10 MG/ML IV SOLN
INTRAVENOUS | Status: AC
  Filled 2016-05-14: qty 100

## 2016-05-14 MED ORDER — LACTATED RINGERS IV SOLN: INTRAVENOUS | Status: DC

## 2016-05-14 MED ORDER — METHOCARBAMOL 500 MG PO TABS: 500.0000 mg | ORAL_TABLET | Freq: Three times a day (TID) | ORAL | 0 refills | Status: DC | PRN

## 2016-05-14 MED ORDER — PHENOL 1.4 % MT LIQD: 1.0000 | OROMUCOSAL | Status: DC | PRN

## 2016-05-14 MED ORDER — SODIUM CHLORIDE 0.9 % IV SOLN: 250.0000 mL | INTRAVENOUS | Status: DC

## 2016-05-14 MED ORDER — LIDOCAINE HCL (PF) 2 % IJ SOLN
INTRAMUSCULAR | Status: DC | PRN
  Administered 2016-05-14: 80 mg via INTRADERMAL

## 2016-05-14 MED ORDER — PROMETHAZINE HCL 25 MG/ML IJ SOLN: 6.2500 mg | INTRAMUSCULAR | Status: DC | PRN

## 2016-05-14 MED ORDER — METHOCARBAMOL 1000 MG/10ML IJ SOLN
500.0000 mg | Freq: Four times a day (QID) | INTRAVENOUS | Status: DC | PRN
  Administered 2016-05-14: 500 mg via INTRAVENOUS
  Filled 2016-05-14 (×3): qty 5

## 2016-05-14 MED ORDER — GLYCOPYRROLATE 0.2 MG/ML IJ SOLN
INTRAMUSCULAR | Status: DC | PRN
  Administered 2016-05-14: .5 mg via INTRAVENOUS

## 2016-05-14 MED ORDER — GLYCOPYRROLATE 0.2 MG/ML IV SOSY
PREFILLED_SYRINGE | INTRAVENOUS | Status: AC
  Filled 2016-05-14: qty 3

## 2016-05-14 MED ORDER — MENTHOL 3 MG MT LOZG: 1.0000 | LOZENGE | OROMUCOSAL | Status: DC | PRN

## 2016-05-14 MED ORDER — HEMOSTATIC AGENTS (NO CHARGE) OPTIME
TOPICAL | Status: DC | PRN
  Administered 2016-05-14: 1 via TOPICAL

## 2016-05-14 MED ORDER — ONDANSETRON HCL 4 MG/2ML IJ SOLN
INTRAMUSCULAR | Status: AC
  Filled 2016-05-14: qty 2

## 2016-05-14 MED ORDER — FENTANYL CITRATE (PF) 100 MCG/2ML IJ SOLN
25.0000 ug | INTRAMUSCULAR | Status: DC | PRN
  Administered 2016-05-14 (×3): 50 ug via INTRAVENOUS

## 2016-05-14 SURGICAL SUPPLY — 66 items
BUR EGG ELITE 4.0 (BURR) IMPLANT
BUR EGG ELITE 4.0MM (BURR)
BUR MATCHSTICK NEURO 3.0 LAGG (BURR) IMPLANT
CANISTER SUCTION 2500CC (MISCELLANEOUS) ×3 IMPLANT
CLOSURE STERI-STRIP 1/2X4 (GAUZE/BANDAGES/DRESSINGS) ×1
CLOSURE WOUND 1/2 X4 (GAUZE/BANDAGES/DRESSINGS) ×1
CLSR STERI-STRIP ANTIMIC 1/2X4 (GAUZE/BANDAGES/DRESSINGS) ×2 IMPLANT
CORDS BIPOLAR (ELECTRODE) ×3 IMPLANT
COVER SURGICAL LIGHT HANDLE (MISCELLANEOUS) ×3 IMPLANT
DRAIN CHANNEL 15F RND FF W/TCR (WOUND CARE) IMPLANT
DRAPE POUCH INSTRU U-SHP 10X18 (DRAPES) ×3 IMPLANT
DRAPE SURG 17X23 STRL (DRAPES) ×3 IMPLANT
DRAPE U-SHAPE 47X51 STRL (DRAPES) ×3 IMPLANT
DRSG AQUACEL AG ADV 3.5X 4 (GAUZE/BANDAGES/DRESSINGS) ×2 IMPLANT
DRSG AQUACEL AG ADV 3.5X 6 (GAUZE/BANDAGES/DRESSINGS) IMPLANT
DURAPREP 26ML APPLICATOR (WOUND CARE) ×3 IMPLANT
ELECT BLADE 4.0 EZ CLEAN MEGAD (MISCELLANEOUS)
ELECT CAUTERY BLADE 6.4 (BLADE) ×3 IMPLANT
ELECT PENCIL ROCKER SW 15FT (MISCELLANEOUS) ×3 IMPLANT
ELECT REM PT RETURN 9FT ADLT (ELECTROSURGICAL) ×3
ELECTRODE BLDE 4.0 EZ CLN MEGD (MISCELLANEOUS) IMPLANT
ELECTRODE REM PT RTRN 9FT ADLT (ELECTROSURGICAL) ×1 IMPLANT
EVACUATOR SILICONE 100CC (DRAIN) IMPLANT
GLOVE BIO SURGEON STRL SZ 6.5 (GLOVE) ×2 IMPLANT
GLOVE BIO SURGEONS STRL SZ 6.5 (GLOVE) ×1
GLOVE BIOGEL PI IND STRL 6.5 (GLOVE) ×1 IMPLANT
GLOVE BIOGEL PI IND STRL 8.5 (GLOVE) ×1 IMPLANT
GLOVE BIOGEL PI INDICATOR 6.5 (GLOVE) ×2
GLOVE BIOGEL PI INDICATOR 8.5 (GLOVE) ×2
GLOVE SS BIOGEL STRL SZ 8.5 (GLOVE) ×1 IMPLANT
GLOVE SUPERSENSE BIOGEL SZ 8.5 (GLOVE) ×2
GOWN STRL REUS W/TWL 2XL LVL3 (GOWN DISPOSABLE) ×6 IMPLANT
KIT BASIN OR (CUSTOM PROCEDURE TRAY) ×3 IMPLANT
KIT ROOM TURNOVER OR (KITS) ×3 IMPLANT
NEEDLE 22X1 1/2 (OR ONLY) (NEEDLE) ×3 IMPLANT
NEEDLE SPNL 18GX3.5 QUINCKE PK (NEEDLE) ×6 IMPLANT
NS IRRIG 1000ML POUR BTL (IV SOLUTION) ×3 IMPLANT
PACK LAMINECTOMY ORTHO (CUSTOM PROCEDURE TRAY) ×3 IMPLANT
PACK UNIVERSAL I (CUSTOM PROCEDURE TRAY) ×3 IMPLANT
PAD ARMBOARD 7.5X6 YLW CONV (MISCELLANEOUS) ×6 IMPLANT
PATTIES SURGICAL .5 X.5 (GAUZE/BANDAGES/DRESSINGS) IMPLANT
PATTIES SURGICAL .5 X1 (DISPOSABLE) ×3 IMPLANT
SPONGE SURGIFOAM ABS GEL 100 (HEMOSTASIS) IMPLANT
STRIP CLOSURE SKIN 1/2X4 (GAUZE/BANDAGES/DRESSINGS) ×1 IMPLANT
SURGIFLO W/THROMBIN 8M KIT (HEMOSTASIS) ×2 IMPLANT
SUT BONE WAX W31G (SUTURE) ×3 IMPLANT
SUT MON AB 3-0 SH 27 (SUTURE)
SUT MON AB 3-0 SH27 (SUTURE) IMPLANT
SUT STRATAFIX 1PDS 45CM VIOLET (SUTURE) IMPLANT
SUT STRATAFIX MNCRL+ 3-0 PS-2 (SUTURE)
SUT STRATAFIX MONOCRYL 3-0 (SUTURE)
SUT STRATAFIX SPIRAL + 2-0 (SUTURE) IMPLANT
SUT VIC AB 0 CT1 27 (SUTURE)
SUT VIC AB 0 CT1 27XBRD ANBCTR (SUTURE) IMPLANT
SUT VIC AB 1 CT1 18XCR BRD 8 (SUTURE) IMPLANT
SUT VIC AB 1 CT1 8-18 (SUTURE)
SUT VIC AB 1 CTX 36 (SUTURE)
SUT VIC AB 1 CTX36XBRD ANBCTR (SUTURE) IMPLANT
SUT VIC AB 2-0 CT1 18 (SUTURE) IMPLANT
SUTURE STRATFX MNCRL+ 3-0 PS-2 (SUTURE) IMPLANT
SYR BULB IRRIGATION 50ML (SYRINGE) ×3 IMPLANT
SYR CONTROL 10ML LL (SYRINGE) ×3 IMPLANT
TOWEL OR 17X24 6PK STRL BLUE (TOWEL DISPOSABLE) ×3 IMPLANT
TOWEL OR 17X26 10 PK STRL BLUE (TOWEL DISPOSABLE) ×3 IMPLANT
WATER STERILE IRR 1000ML POUR (IV SOLUTION) ×3 IMPLANT
YANKAUER SUCT BULB TIP NO VENT (SUCTIONS) ×3 IMPLANT

## 2016-05-14 NOTE — Progress Notes (Signed)
Pt is describing 10 out of 10 pain post surgery BP is 123/52, pulse is 49 and respiration rate is 16 sating at 100% they vital signs are fairly normal and do not belay 10 out of 10 pain. I gave the patient another 2 mg of morphine and .5 mg of, I asked her to call before getting out of bed she did not and I have chosen to let another nurse care for this patient.

## 2016-05-14 NOTE — Progress Notes (Signed)
Paged ortho tech for patient's back brace.

## 2016-05-14 NOTE — Anesthesia Procedure Notes (Signed)
Procedure Name: Intubation Date/Time: 05/14/2016 12:38 PM Performed by: Mariea Clonts Pre-anesthesia Checklist: Emergency Drugs available, Patient identified, Suction available, Patient being monitored and Timeout performed Patient Re-evaluated:Patient Re-evaluated prior to inductionOxygen Delivery Method: Circle system utilized Preoxygenation: Pre-oxygenation with 100% oxygen Intubation Type: IV induction Ventilation: Mask ventilation without difficulty Laryngoscope Size: Miller and 2 Grade View: Grade I Tube type: Oral Tube size: 7.0 mm Number of attempts: 1 Placement Confirmation: ETT inserted through vocal cords under direct vision,  positive ETCO2 and breath sounds checked- equal and bilateral Tube secured with: Tape Dental Injury: Teeth and Oropharynx as per pre-operative assessment

## 2016-05-14 NOTE — Discharge Instructions (Signed)
Ok to shower in 5 days Do not remove dressing

## 2016-05-14 NOTE — Brief Op Note (Signed)
  05/14/2016  2:11 PM  PATIENT:  Carolynn Comment  35 y.o. 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  PROCEDURE:  Procedure(s): LUMBAR DECOMPRESSION/MICRODISCECTOMY LEFT LUMBAR FOUR-FIVE (Left)  SURGEON:  Surgeon(s) and Role:    * Melina Schools, MD - Primary  PHYSICIAN ASSISTANT:   ASSISTANTS: Carmen Mayo   ANESTHESIA:   general  EBL:  Total I/O In: 1000 [I.V.:1000] Out: -   BLOOD ADMINISTERED:none  DRAINS: none   LOCAL MEDICATIONS USED:  MARCAINE     SPECIMEN:  No Specimen  DISPOSITION OF SPECIMEN:  N/A  COUNTS:  YES  TOURNIQUET:  * No tourniquets in log *  DICTATION: .Other Dictation: Dictation Number [id number]  PLAN OF CARE: Admit for overnight observation  PATIENT DISPOSITION:  PACU - hemodynamically stable.

## 2016-05-14 NOTE — H&P (Signed)
History of Present Illness  The patient is a 35 year old female who comes in today for a preoperative History and Physical. The patient is scheduled for a Left L4 Discectomy to be performed by Dr. Duane Lope D. Rolena Infante, MD at [facility] on 05/14/16 . Please see the hospital record for complete dictated history and physical. Pt has decreased smoking from 1 pack a day to a 1/2 pack a day. She said she will continue to wean down.  Additional reasons for visit:  Transition into care is described as the following: The patient is transitioning into care and a summary of care was reviewed.   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 Toomes; 05/12/2016 10:55 AM) PredniSONE (Pak) *CORTICOSTEROIDS*  Hives, Rash. Allergies Reconciled   Family History Cancer  Maternal Grandmother. Cerebrovascular Accident  Maternal Grandfather. Congestive Heart Failure  Maternal Grandfather. Diabetes Mellitus  Mother. Heart Disease  Maternal Grandfather. Hypertension  Maternal Grandfather, Maternal Grandmother. Severe allergy  Mother.  Social History Tobacco / smoke exposure  04/20/2016: yes Tobacco use  Current every day smoker. 04/20/2016: smoke(d) 1 pack(s) per day Children  2 Current work status  seeking work Exercise  Exercises weekly; does running / walking, individual sport and other Former drinker  04/20/2016: In the past drank wine only occasionally per week Living situation  live with parents Marital status  single No history of drug/alcohol rehab  Not under pain contract  Number of flights of stairs before winded  greater than 5  Medication History Multi Vitamin Daily (Oral) Active. (qod) ClonazePAM (0.5MG  Tablet, Oral) Active. (qd) Trintellix (20MG  Tablet, Oral) Active. (qd) Medications Reconciled  Vitals  05/12/2016 10:59 AM Weight: 139  lb Height: 65.5in Body Surface Area: 1.7 m Body Mass Index: 22.78 kg/m  Temp.: 98.52F  Pulse: 80 (Regular)  BP: 113/85 (Sitting, Left Arm, Standard)  General General Appearance-Not in acute distress. Orientation-Oriented X3. Build & Nutrition-Well nourished and Well developed.  Integumentary General Characteristics Surgical Scars - no surgical scar evidence of previous lumbar surgery. Lumbar Spine-Skin examination of the lumbar spine is without deformity, skin lesions, lacerations or abrasions.  Chest and Lung Exam Auscultation Breath sounds - Normal and Clear.  Cardiovascular Auscultation Rhythm - Regular rate and rhythm.  Abdomen Palpation/Percussion Palpation and Percussion of the abdomen reveal - Soft, Non Tender and No Rebound tenderness.  Peripheral Vascular Lower Extremity Palpation - Posterior tibial pulse - Bilateral - 2+. Dorsalis pedis pulse - Bilateral - 2+.  Neurologic Sensation Lower Extremity - Left - sensation is diminished in the lower extremity. Right - sensation is intact in the lower extremity. Reflexes Patellar Reflex - Bilateral - 2+. Achilles Reflex - Bilateral - 2+. Clonus - Bilateral - clonus not present. Hoffman's Sign - Bilateral - Hoffman's sign not present. Testing Seated Straight Leg Raise - Left - Seated straight leg raise positive. Right - Seated straight leg raise negative.  Musculoskeletal Spine/Ribs/Pelvis  Lumbosacral Spine: Inspection and Palpation - Tenderness - left lumbar paraspinals tender to palpation and right lumbar paraspinals tender to palpation. Strength and Tone: Strength - Hip Flexion - Bilateral - 5/5. Knee Extension - Bilateral - 5/5. Knee Flexion - Bilateral - 5/5. Ankle Dorsiflexion - Left - 4-/5. Right - 5/5. Ankle Plantarflexion - Bilateral - 5/5. Heel walk - Bilateral - able to heel walk without difficulty. Toe Walk - Bilateral - able to walk on toes without difficulty. Heel-Toe Walk -  Bilateral -  able to heel-toe walk without difficulty. ROM - Flexion - moderately decreased range of motion and painful. Extension - moderately decreased range of motion and painful. Left Lateral Bending - moderately decreased range of motion and painful. Right Lateral Bending - moderately decreased range of motion and painful. Right Rotation - moderately decreased range of motion and painful. Left Rotation - moderately decreased range of motion and painful. Pain - . Lumbosacral Spine - Waddell's Signs - no Waddell's signs present. Lower Extremity Range of Motion - No true hip, knee or ankle pain with range of motion. Gait and Station - [employer] - no assistive devices.  RADIOGRAPHS MRI from 04/30/2016 shows a large extruded disc fragment at L4-L5 posterolateral to the left with obvious L5 neural compression.   Plan: Goal Of Surgery: Discussed that goal of surgery is to reduce pain and improve function and quality of life. Patient is aware that despite all appropriate treatment that there pain and function could be the same, worse, or different.  Posterior Lumbar Decompression/disectomy: Risks of surgery include infection, bleeding, nerve damage, death, stroke, paralysis, failure to heal, need for further surgery, ongoing or worse pain, need for further surgery, CSF leak, loss of bowel or bladder, and recurrent disc herniation or Stenosis which would necessitate need for further surgery.  Plans Transcription At this point in time, the patient has L5 radiculopathy both on clinical exam and MRI findings. This has been going on now for 3 months with only progression in her symptoms. Given this, I think the best course of action is surgical intervention. We did discuss briefly injection therapy and physical therapy, but given the size of the fragment and duration of her symptoms and severity of her pain, I think surgery offers the best option for her to return to a normal lifestyle. Risks were discussed in  great detail. All of her questions were addressed. We will plan on doing a lumbar discectomy in the near future.

## 2016-05-14 NOTE — Transfer of Care (Signed)
Immediate Anesthesia Transfer of Care Note  Patient: Lindsey Sampson  Procedure(s) Performed: Procedure(s): LUMBAR DECOMPRESSION/MICRODISCECTOMY LEFT LUMBAR FOUR-FIVE (Left)  Patient Location: PACU  Anesthesia Type:General  Level of Consciousness: awake, alert  and oriented  Airway & Oxygen Therapy: Patient Spontanous Breathing and Patient connected to nasal cannula oxygen  Post-op Assessment: Report given to RN, Post -op Vital signs reviewed and stable and Patient moving all extremities X 4  Post vital signs: Reviewed and stable  Last Vitals:  Vitals:   05/14/16 1421 05/14/16 1440  BP: 117/75 104/70  Pulse:  90  Resp: 16 20  Temp: 36.8 C     Last Pain:  Vitals:   05/14/16 1439  PainSc: 8       Patients Stated Pain Goal: 2 ([id number])  Complications: No apparent anesthesia complications

## 2016-05-14 NOTE — OR Nursing (Signed)
Dr. Dimas Chyle, radiology, confirmed probe to be at correct level.

## 2016-05-14 NOTE — Anesthesia Postprocedure Evaluation (Signed)
Anesthesia Post Note  Patient: Lindsey Sampson  Procedure(s) Performed: Procedure(s) (LRB): LUMBAR DECOMPRESSION/MICRODISCECTOMY LEFT LUMBAR FOUR-FIVE (Left)  Patient location during evaluation: PACU Anesthesia Type: General Level of consciousness: awake, awake and alert and oriented Pain management: pain level controlled Vital Signs Assessment: post-procedure vital signs reviewed and stable Respiratory status: spontaneous breathing, nonlabored ventilation and respiratory function stable Cardiovascular status: blood pressure returned to baseline Anesthetic complications: no    Last Vitals:  Vitals:   05/14/16 1715 05/14/16 1739  BP: (!) 97/56 115/71  Pulse: (!) 54 71  Resp: 13 13  Temp:  36.8 C    Last Pain:  Vitals:   05/14/16 1739  PainSc: 8                  Cody Oliger COKER

## 2016-05-15 ENCOUNTER — Encounter (HOSPITAL_COMMUNITY): Payer: Self-pay | Admitting: *Deleted

## 2016-05-15 DIAGNOSIS — M5116 Intervertebral disc disorders with radiculopathy, lumbar region: Secondary | ICD-10-CM | POA: Diagnosis not present

## 2016-05-15 NOTE — Op Note (Signed)
Lindsey Sampson, GARAY               ACCOUNT NO.:  [id number]  MEDICAL RECORD NO.:  [id number]  LOCATION:  SDS                          FACILITY:  [facility]  PHYSICIAN:  Geniva Lohnes D. Rolena Infante, M.D. 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  OPERATIVE PROCEDURE:  Lumbar laminotomy for diskectomy, CPT code [phone].  FIRST ASSISTANT:  [facility]  HISTORY:  This is a very pleasant 35 year old female, who has had significant leg and back pain for 3 months with progressive weakness of her foot.  Attempts at conservative management had failed and as a result, we elected to proceed with surgery.  All appropriate risks, benefits, and alternatives were discussed with the patient, and consent was obtained.  OPERATIVE NOTE:  The patient was brought to the operating room and placed supine on the operating table.  After successful induction of general anesthesia and endotracheal intubation, TEDs, SCDs were applied. She was turned prone onto the [place] frame.  All bony prominences were well padded.  The back was prepped and draped in standard fashion.  Time- out was taken to confirm patient, procedure, and all other pertinent important data.  Once this was done, two needles were placed into the back and x-ray was taken confirming the incision site.  This was infiltrated with 0.25% Marcaine and then a small posterior midline incision was made and centered over the 4-5 disc.  Sharp dissection was carried out down to the deep fascia.  Deep fascia was sharply incised on the left-hand side and I stripped the paraspinal muscles to expose the L4 and L5 spinous processes.  A Penfield 4 was placed underneath the L4 lamina, and x-ray was taken to  confirm the appropriate level.  Once this was done, a laminotomy was created with a 3 mm Kerrison punch, and then I released the ligamentum flavum from the leading edge of the base of the L5 lamina.  I then used my Penfield 4 to complete my dissection through the ligamentum flavum and created a plane between the ligamentum flavum and dura, _____ 2 and 3 mm Kerrison punch to perform to excise the ligamentum flavum and expose the thecal sac.  At this point, I noted the L5 nerve root.  It was dorsally displaced and under tension.  I continued into the lateral recess to create a room and using a Penfield 4 to gently mobilize the thecal sac and L5 nerve root to the midline and exposed a large disk fragment.  I then placed neural patties superiorly and inferiorly to aid in retraction.  This protected the L5 nerve root. I incised the annulus and I and D, and I used a nerve hook to deliver 3 large fragments of disk material.  Once this was done, I traced up to the disk space itself and removed the fragments of disc that were still at the disk space level at this point, I now traced the disk herniation from the disk space  L4-5 inferiorly to the level of the inferior aspect of the pedicle.  I removed all the disk fragments and the nerve root was now easily mobilized.  It was no longer under tension.  I irrigated the wound copiously normal saline and then with my [facility] passed it inferiorly, medially, and superiorly to confirm adequate decompression and no retained fragments of disc.  Once this was done and confirmed hemostasis, I closed the deep fascia with interrupted #1 Vicryl sutures superficial with 2-0 Vicryl sutures and a 3-0 Monocryl for the skin.  Steri-Strips and dry dressing were applied and the patient was ultimately extubated, transferred to PACU without incident.  At the end of the case, all needle and sponge counts were correct.  There were no adverse intraoperative events.   First assistant was [employer], my PA.     Lindsey Sampson D. Rolena Infante, M.D.     DDB/MEDQ  D:  05/14/2016  T:  05/15/2016  Job:  [id number]

## 2016-05-15 NOTE — Evaluation (Signed)
Occupational Therapy Evaluation and Discharge Patient Details Name: Lindsey Sampson MRN: [id number] DOB: [birth year] Today's Date: 05/15/2016    History of Present Illness Pt is a 35 y.o. female s/p LUMBAR DECOMPRESSION/MICRODISCECTOMY LEFT LUMBAR FOUR-FIVE. No pertinent PMHx in chart.    Clinical Impression   Pt reports she was independent with ADL PTA. Currently pt overall supervision for safety with ADL and functional mobility. All back, safety, and ADL education completed; pt with no further questions or concerns for OT at this time. Pt planning to d/c home with 24/7 supervision from family. No further acute OT needs identified; signing off at this time. Please re-consult if needs change. Thank you for this referral.    Follow Up Recommendations  No OT follow up;Supervision/Assistance - 24 hour (initially)    Equipment Recommendations  None recommended by OT    Recommendations for Other Services PT consult     Precautions / Restrictions Precautions Precautions: Back;Fall Precaution Booklet Issued: Yes (comment) Precaution Comments: Educated pt on back precautions. Required Braces or Orthoses: Spinal Brace Spinal Brace: Lumbar corset (no brace present on OT eval) Restrictions Weight Bearing Restrictions: No      Mobility Bed Mobility Overal bed mobility: Needs Assistance Bed Mobility: Rolling;Sidelying to Sit Rolling: Supervision Sidelying to sit: Supervision;HOB elevated       General bed mobility comments: Supervision for safety. HOB elevated with use of grab bars. VCs for log roll technique.  Transfers Overall transfer level: Needs assistance Equipment used: Rolling walker (2 wheeled) Transfers: Sit to/from Stand Sit to Stand: Supervision         General transfer comment: Supervision for safety; no physical assist required. VCs for hand placement with RW.    Balance Overall balance assessment: Needs assistance Sitting-balance support: Feet supported;No  upper extremity supported Sitting balance-Leahy Scale: Good     Standing balance support: No upper extremity supported;During functional activity Standing balance-Leahy Scale: Good                              ADL Overall ADL's : Needs assistance/impaired Eating/Feeding: Modified independent;Sitting   Grooming: Supervision/safety;Standing;Wash/dry hands;Oral care Grooming Details (indicate cue type and reason): Educated on use of 2 cups for oral care Upper Body Bathing: Set up;Supervision/ safety;Sitting   Lower Body Bathing: Set up;Supervison/ safety;Sit to/from stand   Upper Body Dressing : Set up;Sitting   Lower Body Dressing: Set up;Supervision/safety;Sit to/from stand;Adhering to back precautions Lower Body Dressing Details (indicate cue type and reason): Pt able to don socks sitting EOB using compensatory strategies to maintain back precautions. Toilet Transfer: Supervision/safety;Ambulation;Regular Toilet;RW   Toileting- Clothing Manipulation and Hygiene: Supervision/safety;Sitting/lateral lean;Sit to/from stand Toileting - Water quality scientist Details (indicate cue type and reason): Pt able to perform peri care in sitting with lateral lean; no need for AE Tub/ Shower Transfer: Supervision/safety;Walk-in shower;Ambulation;Shower seat;Rolling walker   Functional mobility during ADLs: Supervision/safety;Rolling walker General ADL Comments: Educated pt on maintaining back precautions during functional activities, no reaching up high. No brace present on OT eval but educated on brace wear schedule.     Vision Vision Assessment?: No apparent visual deficits   Perception     Praxis      Pertinent Vitals/Pain Pain Assessment: 0-10 Pain Score: 10-Worst pain ever Pain Location: back Pain Descriptors / Indicators: Aching;Other (Comment) (stiff) Pain Intervention(s): Monitored during session;Repositioned;RN gave pain meds during session     Hand Dominance      Extremity/Trunk Assessment Upper Extremity Assessment  Upper Extremity Assessment: Overall WFL for tasks assessed   Lower Extremity Assessment Lower Extremity Assessment: Defer to PT evaluation   Cervical / Trunk Assessment Cervical / Trunk Assessment: Other exceptions Cervical / Trunk Exceptions: s/p lumbar sx   Communication Communication Communication: No difficulties   Cognition Arousal/Alertness: Awake/alert Behavior During Therapy: WFL for tasks assessed/performed Overall Cognitive Status: Within Functional Limits for tasks assessed                     General Comments       Exercises       Shoulder Instructions      Home Living Family/patient expects to be discharged to:: Private residence Living Arrangements: Spouse/significant other;Other relatives (grandparents) Available Help at Discharge: Family;Available 24 hours/day Type of Home: House Home Access: Level entry     Home Layout: One level     Bathroom Shower/Tub: Occupational psychologist: Standard     Home Equipment: Environmental consultant - 2 wheels;Cane - single point;Bedside commode;Shower seat - built in          Prior Functioning/Environment Level of Independence: Independent             OT Diagnosis: Acute pain   OT Problem List:     OT Treatment/Interventions:      OT Goals(Current goals can be found in the care plan section) Acute Rehab OT Goals Patient Stated Goal: return home today OT Goal Formulation: All assessment and education complete, DC therapy  OT Frequency:     Barriers to D/C:            Co-evaluation              End of Session Equipment Utilized During Treatment: Rolling walker Nurse Communication: Mobility status  Activity Tolerance: Patient tolerated treatment well Patient left: in chair;with family/visitor present   Time: 0822-0843 OT Time Calculation (min): 21 min Charges:  OT General Charges $OT Visit: 1 Procedure OT Evaluation $OT Eval  Low Complexity: 1 Procedure G-Codes: OT G-codes **NOT FOR INPATIENT CLASS** Functional Assessment Tool Used: Clinical judgement Functional Limitation: Self care Self Care Current Status [id number]): At least 1 percent but less than 20 percent impaired, limited or restricted Self Care Goal Status [id number]): At least 1 percent but less than 20 percent impaired, limited or restricted Self Care Discharge Status [id number]): At least 1 percent but less than 20 percent impaired, limited or restricted   Binnie Kand M.S., OTR/L Pager: [phone]  05/15/2016, 8:53 AM

## 2016-05-15 NOTE — Evaluation (Signed)
Physical Therapy Evaluation Patient Details Name: Lindsey Sampson MRN: [id number] DOB: 09/25/1981 Today's Date: 05/15/2016   History of Present Illness  Pt is a 35 y.o. female s/p LUMBAR DECOMPRESSION/MICRODISCECTOMY LEFT LUMBAR FOUR-FIVE. No pertinent PMHx in chart.   Clinical Impression  Patient presents with pain and post surgical deficits s/p above surgery impacting mobility. Mobility assessment limited due to 10/10 pain. Tolerated gait training, transfers and bed mobility with Min guard assist and cues for technique/safety. Recommend use of RW due to weakness and pain. Pt will have support from family at home. Education re: back precautions, positioning, brace etc. Will follow acutely to maximize independence and mobility prior to return home.    Follow Up Recommendations Supervision for mobility/OOB;No PT follow up    Equipment Recommendations  Rolling walker with 5" wheels    Recommendations for Other Services       Precautions / Restrictions Precautions Precautions: Back;Fall Precaution Booklet Issued: No Precaution Comments: Reviewed back precautions Required Braces or Orthoses: Spinal Brace Spinal Brace: Lumbar corset Restrictions Weight Bearing Restrictions: No      Mobility  Bed Mobility Overal bed mobility: Needs Assistance Bed Mobility: Rolling;Sit to Sidelying Rolling: Modified independent (Device/Increase time) Sidelying to sit: Modified independent (Device/Increase time)       General bed mobility comments: HOB flat, no use of rails to simulate home. Cues for log roll technique.  Transfers Overall transfer level: Needs assistance Equipment used: Rolling walker (2 wheeled) Transfers: Sit to/from Stand Sit to Stand: Min guard         General transfer comment: Min guard for safety; no physical assist required. VCs for hand placement; increased time due to pain.  Ambulation/Gait Ambulation/Gait assistance: Min guard Ambulation Distance (Feet): 100  Feet Assistive device: Rolling walker (2 wheeled) Gait Pattern/deviations: Step-to pattern;Step-through pattern;Decreased stride length;Trunk flexed Gait velocity: decreased Gait velocity interpretation: Below normal speed for age/gender General Gait Details: Slow, guarded gait. Cues for upright posture. Increased knee flexion bilaterally.  Stairs            Wheelchair Mobility    Modified Rankin (Stroke Patients Only)       Balance Overall balance assessment: Needs assistance Sitting-balance support: Feet supported;No upper extremity supported Sitting balance-Leahy Scale: Fair     Standing balance support: During functional activity Standing balance-Leahy Scale: Poor Standing balance comment: Reliant on BUEs for support due to pain.                             Pertinent Vitals/Pain Pain Assessment: 0-10 Pain Score: 10-Worst pain ever Pain Location: back at incision Pain Descriptors / Indicators: Sore;Aching Pain Intervention(s): Monitored during session;Patient requesting pain meds-RN notified;Limited activity within patient's tolerance;Repositioned    Home Living Family/patient expects to be discharged to:: Private residence Living Arrangements: Spouse/significant other;Other relatives (grandparents) Available Help at Discharge: Family;Available 24 hours/day Type of Home: House Home Access: Stairs to enter   [employer] of Steps: 1 Home Layout: One level Home Equipment: Cane - single point;Bedside commode;Shower seat - built in      Prior Function Level of Independence: Independent               Hand Dominance        Extremity/Trunk Assessment   Upper Extremity Assessment: Defer to OT evaluation           Lower Extremity Assessment: Generalized weakness (sensation WFL )      Cervical / Trunk Assessment: Other  exceptions  Communication   Communication: No difficulties  Cognition Arousal/Alertness:  Awake/alert Behavior During Therapy: WFL for tasks assessed/performed Overall Cognitive Status: Within Functional Limits for tasks assessed                      General Comments      Exercises        Assessment/Plan    PT Assessment Patient needs continued PT services  PT Diagnosis Difficulty walking;Acute pain;Generalized weakness   PT Problem List Decreased strength;Decreased mobility;Decreased knowledge of precautions;Decreased activity tolerance;Decreased balance;Pain  PT Treatment Interventions Therapeutic activities;Gait training;Therapeutic exercise;Patient/family education;Balance training;Stair training;Functional mobility training   PT Goals (Current goals can be found in the Care Plan section) Acute Rehab PT Goals Patient Stated Goal: return home today PT Goal Formulation: With patient Time For Goal Achievement: 05/29/16 Potential to Achieve Goals: Fair    Frequency Min 5X/week   Barriers to discharge        Co-evaluation               End of Session Equipment Utilized During Treatment: Back brace Activity Tolerance: Patient limited by pain Patient left: in bed;with call bell/phone within reach;with family/visitor present Nurse Communication: Mobility status    Functional Assessment Tool Used: clinical judgment Functional Limitation: Mobility: Walking and moving around Mobility: Walking and Moving Around Current Status [phone]): At least 1 percent but less than 20 percent impaired, limited or restricted Mobility: Walking and Moving Around Goal Status [phone]): At least 1 percent but less than 20 percent impaired, limited or restricted    Time: 1000-1015 PT Time Calculation (min) (ACUTE ONLY): 15 min   Charges:   PT Evaluation $PT Eval Low Complexity: 1 Procedure     PT G Codes:   PT G-Codes **NOT FOR INPATIENT CLASS** Functional Assessment Tool Used: clinical judgment Functional Limitation: Mobility: Walking and moving around Mobility:  Walking and Moving Around Current Status [id number]): At least 1 percent but less than 20 percent impaired, limited or restricted Mobility: Walking and Moving Around Goal Status [phone]): At least 1 percent but less than 20 percent impaired, limited or restricted    [place] 05/15/2016, 10:19 AM Wray Kearns, PT, DPT [phone]

## 2016-05-15 NOTE — Op Note (Deleted)
  The note originally documented on this encounter has been moved the the encounter in which it belongs.  

## 2016-05-15 NOTE — Progress Notes (Deleted)
Patient is discharged from room 5M03 at this time. Alert and in stable condition. IV site d/c'd and instructions read to patient with understanding verbalized. Left unit via wheelchair with all belongings at side

## 2016-05-15 NOTE — Progress Notes (Signed)
Patient is discharged from room 5M03 at this time. Alert and in stable condition. IV site d/c'd and instructions read to patient with understanding verbalized. Left unit via wheelchair with all belongings at side

## 2016-05-15 NOTE — Clinical Social Work Note (Signed)
CSW consulted for New SNF. PT recommended no follow up. Pt is ready for discharge today and will return home. CSW is signing off as no further needs identified.   Darden Dates, MSW, LCSW Clinical Social Worker  [phone]

## 2016-05-15 NOTE — Progress Notes (Signed)
    Subjective: Procedure(s) (LRB): LUMBAR DECOMPRESSION/MICRODISCECTOMY LEFT LUMBAR FOUR-FIVE (Left) 1 Day Post-Op  Patient reports pain as 4 on 0-10 scale.  Reports deferred at this time leg pain reports incisional back pain   Positive void Negative bowel movement Positive flatus Negative chest pain or shortness of breath  Objective: Vital signs in last 24 hours: Temp:  [97.6 F (36.4 C)-98.3 F (36.8 C)] 97.6 F (36.4 C) (08/18 0532) Pulse Rate:  [49-91] 83 (08/18 0532) Resp:  [11-20] 18 (08/18 0532) BP: (97-123)/(51-81) 114/71 (08/18 0532) SpO2:  [98 %-100 %] 100 % (08/18 0532) Weight:  [64 kg (141 lb 1.6 oz)-71.8 kg (158 lb 4.6 oz)] 71.8 kg (158 lb 4.6 oz) (08/17 1815)  Intake/Output from previous day: 08/17 0701 - 08/18 0700 In: 1000 [I.V.:1000] Out: 550 [Urine:500; Blood:50]  Labs:  Recent Labs  05/12/16 1348  WBC 6.0  RBC 3.81*  HCT 36.9  PLT 235   No results for input(s): NA, K, CL, CO2, BUN, CREATININE, GLUCOSE, CALCIUM in the last 72 hours. No results for input(s): LABPT, INR in the last 72 hours.  Physical Exam: Neurologically intact Intact pulses distally Incision: dressing C/D/I Compartment soft EHL/TA strength improved.  Negative ST leg raise  Assessment/Plan: Patient stable  xrays N/A Continue mobilization with physical therapy Continue care  Advance diet Up with therapy  Plan on d/c after PT today  Melina Schools, MD [place] [phone]

## 2016-06-03 NOTE — Discharge Summary (Signed)
Patient ID: Lindsey Sampson MRN: [id number] DOB/AGE: [birth year] 35 y.o.  Admit date: 05/14/2016 Discharge date: 06/03/2016  Admission Diagnoses:  Active Problems:   Lumbar disc herniation   Discharge Diagnoses:  Active Problems:   Lumbar disc herniation  status post Procedure(s): LUMBAR DECOMPRESSION/MICRODISCECTOMY LEFT LUMBAR FOUR-FIVE  Past Medical History:  Diagnosis Date  . Anxiety   . Colitis   . Depression     Surgeries: Procedure(s): LUMBAR DECOMPRESSION/MICRODISCECTOMY LEFT LUMBAR FOUR-FIVE on 05/14/2016   Consultants:   Discharged Condition: Improved  Hospital Course: Lindsey Sampson is an 35 y.o. female who was admitted 05/14/2016 for operative treatment of Lumbar DDD. Patient failed conservative treatments (please see the history and physical for the specifics) and had severe unremitting pain that affects sleep, daily activities and work/hobbies. After pre-op clearance, the patient was taken to the operating room on 05/14/2016 and underwent  Procedure(s): LUMBAR DECOMPRESSION/MICRODISCECTOMY LEFT LUMBAR FOUR-FIVE.  Pt is voiding w/o difficulty.  Pt is ambulating well.  Pain controlled on home medication.  Pt cleared by PT for DC.  Negative leg pain.   Patient was given perioperative antibiotics:  Anti-infectives    Start     Dose/Rate Route Frequency Ordered Stop   05/14/16 2100  ceFAZolin (ANCEF) IVPB 1 g/50 mL premix     1 g 100 mL/hr over 30 Minutes Intravenous Every 8 hours 05/14/16 1800 05/15/16 0510   05/14/16 1300  ceFAZolin (ANCEF) IVPB 2g/100 mL premix     2 g 200 mL/hr over 30 Minutes Intravenous To [facility]Stay [facility] 05/13/16 1040 05/14/16 1240       Patient was given sequential compression devices and early ambulation to prevent DVT.   Patient benefited maximally from hospital stay and there were no complications. At the time of discharge, the patient was urinating/moving their bowels without difficulty, tolerating a regular diet, pain is  controlled with oral pain medications and they have been cleared by PT/OT.   Recent vital signs: No data found.    Recent laboratory studies: No results for input(s): WBC, HGB, HCT, PLT, NA, K, CL, CO2, BUN, CREATININE, GLUCOSE, INR, CALCIUM in the last 72 hours.  Invalid input(s): PT, 2   Discharge Medications:     Medication List    STOP taking these medications   orphenadrine 100 MG tablet Commonly known as:  NORFLEX   oxyCODONE-acetaminophen 5-325 MG tablet Commonly known as:  PERCOCET/ROXICET Replaced by:  oxyCODONE-acetaminophen 10-325 MG tablet   traMADol 50 MG tablet Commonly known as:  ULTRAM     TAKE these medications   clonazePAM 0.5 MG tablet Commonly known as:  KLONOPIN Take 0.5 mg by mouth 3 (three) times daily as needed. For anxiety/sleep.   methocarbamol 500 MG tablet Commonly known as:  ROBAXIN Take 1 tablet (500 mg total) by mouth 3 (three) times daily as needed for muscle spasms.   multivitamin with minerals Tabs tablet Take 1 tablet by mouth every other day.   ondansetron 4 MG tablet Commonly known as:  ZOFRAN Take 1 tablet (4 mg total) by mouth every 8 (eight) hours as needed for nausea or vomiting.   oxyCODONE-acetaminophen 10-325 MG tablet Commonly known as:  PERCOCET Take 1 tablet by mouth every 4 (four) hours as needed for pain. Replaces:  oxyCODONE-acetaminophen 5-325 MG tablet   TRINTELLIX 20 MG Tabs Generic drug:  vortioxetine HBr Take 20 mg by mouth daily.       Diagnostic Studies: Dg Spine Portable 1 View  Result Date: 05/14/2016 CLINICAL DATA:  L4-5 lumbar laminectomy EXAM: PORTABLE SPINE - 1 VIEW COMPARISON:  None. FINDINGS: Cross-table lateral lumbar image labeled #2 is submitted. Metallic probe tip is posterior to the L4-5 interspace level. There is no fracture or spondylolisthesis. There is moderate disc space narrowing at L4-5. IMPRESSION: Metallic probe tip is posterior to the L4-5 interspace level. Disc space narrowing  noted at L4-5. These results were called by telephone at the time of interpretation on 05/14/2016 at 1:15 pm to Dr. Melina Schools , who verbally acknowledged these results. Electronically Signed   By: Lowella Grip III M.D.   On: 05/14/2016 13:15      Follow-up Information    Dahlia Bailiff, MD In 2 weeks.   Specialty:  Orthopedic Surgery Why:  If symptoms worsen, For suture removal, For wound re-check Contact information: [address] [place] [address] [place] [address] [phone]           Discharge Plan:  discharge to home.  Pt will f/u in clinic in 2 weeks.  Post op meds provided.   Disposition:     Signed: MayoDarla Lesches for Dr. Melina Schools [facility] [phone] 06/03/2016, 7:57 AM

## 2016-08-15 ENCOUNTER — Ambulatory Visit (HOSPITAL_COMMUNITY)
Admission: EM | Admit: 2016-08-15 | Discharge: 2016-08-15 | Disposition: A | Discharge status: Other Acute Inpatient Hospital | Payer: Medicaid Other
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
## 2016-09-17 ENCOUNTER — Ambulatory Visit: Payer: Medicaid Other | Admitting: Family Medicine
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
## 2016-09-24 ENCOUNTER — Encounter: Payer: Self-pay | Admitting: Obstetrics and Gynecology

## 2016-10-07 ENCOUNTER — Encounter: Payer: Self-pay | Admitting: Obstetrics and Gynecology

## 2016-11-03 ENCOUNTER — Ambulatory Visit: Payer: Medicaid Other | Admitting: Obstetrics and Gynecology

## 2016-11-09 ENCOUNTER — Ambulatory Visit: Payer: Medicaid Other | Admitting: Obstetrics and Gynecology

## 2017-05-22 ENCOUNTER — Ambulatory Visit (HOSPITAL_COMMUNITY)
Admission: EM | Admit: 2017-05-22 | Discharge: 2017-05-22 | Disposition: A | Discharge status: Home / Self Care | Payer: Medicaid Other | Attending: Family Medicine | Admitting: Family Medicine

## 2017-05-22 ENCOUNTER — Ambulatory Visit (INDEPENDENT_AMBULATORY_CARE_PROVIDER_SITE_OTHER): Payer: Medicaid Other

## 2017-05-22 ENCOUNTER — Encounter (HOSPITAL_COMMUNITY): Payer: Self-pay | Admitting: *Deleted

## 2017-05-22 DIAGNOSIS — K5900 Constipation, unspecified: Secondary | ICD-10-CM | POA: Diagnosis not present

## 2017-05-22 HISTORY — DX: Cyst of spleen: D73.4

## 2017-05-22 HISTORY — DX: Disorder of kidney and ureter, unspecified: N28.9

## 2017-05-22 HISTORY — DX: Unspecified ovarian cyst, unspecified side: N83.209

## 2017-05-22 LAB — POCT URINALYSIS DIP (DEVICE)
Bilirubin Urine: NEGATIVE
Glucose, UA: NEGATIVE mg/dL
Ketones, ur: NEGATIVE mg/dL
Leukocytes, UA: NEGATIVE
Nitrite: NEGATIVE
PH: 7.5 (ref 5.0–8.0)
Protein, ur: NEGATIVE mg/dL
SPECIFIC GRAVITY, URINE: 1.015 (ref 1.005–1.030)
UROBILINOGEN UA: 0.2 mg/dL (ref 0.0–1.0)

## 2017-05-22 LAB — POCT PREGNANCY, URINE: Preg Test, Ur: NEGATIVE

## 2017-05-22 MED ORDER — DOCUSATE SODIUM 100 MG PO CAPS: 100.0000 mg | ORAL_CAPSULE | Freq: Two times a day (BID) | ORAL | 0 refills | Status: DC

## 2017-05-22 MED ORDER — POLYETHYLENE GLYCOL 3350 17 G PO PACK: 17.0000 g | PACK | Freq: Two times a day (BID) | ORAL | 1 refills | Status: DC

## 2017-05-22 NOTE — ED Triage Notes (Signed)
Reports constant left side pain x 3 days with progressive worsening.  Has hx of cyst on ovary, kidney, and possibly spleen.  Denies fevers, but c/o hot flashes.

## 2017-05-22 NOTE — ED Provider Notes (Signed)
[facility]   [id number] 05/22/17 Arrival Time: [place]:  1. Constipation, unspecified constipation type     Meds ordered this encounter  Medications  . polyethylene glycol (MIRALAX) packet    Sig: Take 17 g by mouth 2 (two) times daily.    Dispense:  14 each    Refill:  1    Order Specific Question:   Supervising Provider    Answer:   Vanessa Kick [id number]  . docusate sodium (COLACE) 100 MG capsule    Sig: Take 1 capsule (100 mg total) by mouth 2 (two) times daily.    Dispense:  60 capsule    Refill:  0    Order Specific Question:   Supervising Provider    Answer:   Vanessa Kick [[id number]]    Reviewed expectations re: course of current medical issues. Questions answered. Outlined signs and symptoms indicating need for more acute intervention. Patient verbalized understanding. After Visit Summary given.   SUBJECTIVE:  Lindsey Sampson is a 36 y.o. female who presents with complaint of bilateral upper abdominal discomfort.  ROS: As per HPI.   OBJECTIVE:  Vitals:   05/22/17 1430  BP: 130/80  Pulse: 66  Resp: 16  Temp: 97.6 F (36.4 C)  TempSrc: Oral  SpO2: 99%     General appearance: alert; no distress Eyes: PERRLA; EOMI; conjunctiva normal HENT: normocephalic; atraumatic; TMs normal; nasal mucosa normal; oral mucosa normal Neck: supple Lungs: clear to auscultation bilaterally Heart: regular rate and rhythm Abdomen: soft, tender RUQ and LUQ and LLQ ; bowel sounds normal; no masses or organomegaly; no guarding or rebound tenderness Back: no CVA tenderness   Past Medical History:  Diagnosis Date  . Anxiety   . Colitis   . Cyst of spleen   . Depression   . Ovarian cyst   . Renal disorder    renal cyst     has a past medical history of Anxiety; Colitis; Cyst of spleen; Depression; Ovarian cyst; and Renal disorder.  Results for orders placed or performed during the hospital encounter of 05/22/17  POCT urinalysis dip  (device)  Result Value Ref Range   Glucose, UA NEGATIVE NEGATIVE mg/dL   Bilirubin Urine NEGATIVE NEGATIVE   Ketones, ur NEGATIVE NEGATIVE mg/dL   Specific Gravity, Urine 1.015 1.005 - 1.030   Hgb urine dipstick TRACE (A) NEGATIVE   pH 7.5 5.0 - 8.0   Protein, ur NEGATIVE NEGATIVE mg/dL   Urobilinogen, UA 0.2 0.0 - 1.0 mg/dL   Nitrite NEGATIVE NEGATIVE   Leukocytes, UA NEGATIVE NEGATIVE  Pregnancy, urine POC  Result Value Ref Range   Preg Test, Ur NEGATIVE NEGATIVE    Labs Reviewed  POCT URINALYSIS DIP (DEVICE) - Abnormal; Notable for the following:       Result Value   Hgb urine dipstick TRACE (*)    All other components within normal limits  POCT PREGNANCY, URINE    Imaging: Dg Abd 1 View  Result Date: 05/22/2017 CLINICAL DATA:  Three day history of left-sided pain. EXAM: ABDOMEN - 1 VIEW COMPARISON:  None. FINDINGS: The bowel gas pattern is normal. No radio-opaque calculi or other significant radiographic abnormality are seen. IMPRESSION: Negative. Electronically Signed   By: Misty Stanley M.D.   On: 05/22/2017 14:56    Allergies  Allergen Reactions  . Prednisone Rash    No family history on file. Past Surgical History:  Procedure Laterality Date  . BACK SURGERY    . CESAREAN SECTION    .  CESAREAN SECTION  2006 and 2010  . cyst removed from ovary Right   . LUMBAR LAMINECTOMY/DECOMPRESSION MICRODISCECTOMY Left 05/14/2016   Procedure: LUMBAR DECOMPRESSION/MICRODISCECTOMY LEFT LUMBAR FOUR-FIVE;  Surgeon: Melina Schools, MD;  Location: [facility];  Service: Orthopedics;  Laterality: Left;  . TUBAL LIGATION           Lysbeth Penner, [place] 05/22/17 1538

## 2018-09-14 ENCOUNTER — Ambulatory Visit: Payer: Self-pay | Admitting: Certified Nurse Midwife

## 2018-10-04 ENCOUNTER — Ambulatory Visit: Payer: Self-pay | Admitting: Advanced Practice Midwife

## 2018-10-14 ENCOUNTER — Other Ambulatory Visit (HOSPITAL_COMMUNITY)
Admission: RE | Admit: 2018-10-14 | Discharge: 2018-10-14 | Disposition: A | Discharge status: Home / Self Care | Payer: Medicaid Other | Source: Ambulatory Visit | Attending: Advanced Practice Midwife | Admitting: Advanced Practice Midwife

## 2018-10-14 ENCOUNTER — Ambulatory Visit (INDEPENDENT_AMBULATORY_CARE_PROVIDER_SITE_OTHER): Payer: Medicaid Other | Admitting: Advanced Practice Midwife

## 2018-10-14 ENCOUNTER — Encounter: Payer: Self-pay | Admitting: Advanced Practice Midwife

## 2018-10-14 VITALS — BP 126/82 | HR 81 | Ht 66.0 in | Wt 167.0 lb

## 2018-10-14 DIAGNOSIS — Z01419 Encounter for gynecological examination (general) (routine) without abnormal findings: Secondary | ICD-10-CM

## 2018-10-14 DIAGNOSIS — Z0000 Encounter for general adult medical examination without abnormal findings: Secondary | ICD-10-CM

## 2018-10-14 DIAGNOSIS — R350 Frequency of micturition: Secondary | ICD-10-CM

## 2018-10-14 DIAGNOSIS — Z124 Encounter for screening for malignant neoplasm of cervix: Secondary | ICD-10-CM | POA: Insufficient documentation

## 2018-10-14 DIAGNOSIS — Z9889 Other specified postprocedural states: Secondary | ICD-10-CM

## 2018-10-14 DIAGNOSIS — Z9851 Tubal ligation status: Secondary | ICD-10-CM

## 2018-10-14 DIAGNOSIS — Z8742 Personal history of other diseases of the female genital tract: Secondary | ICD-10-CM

## 2018-10-14 NOTE — Patient Instructions (Signed)
Preventive Care 18-39 Years, Female Preventive care refers to lifestyle choices and visits with your health care provider that can promote health and wellness. What does preventive care include?   A yearly physical exam. This is also called an annual well check.  Dental exams once or twice a year.  Routine eye exams. Ask your health care provider how often you should have your eyes checked.  Personal lifestyle choices, including: ? Daily care of your teeth and gums. ? Regular physical activity. ? Eating a healthy diet. ? Avoiding tobacco and drug use. ? Limiting alcohol use. ? Practicing safe sex. ? Taking vitamin and mineral supplements as recommended by your health care provider. What happens during an annual well check? The services and screenings done by your health care provider during your annual well check will depend on your age, overall health, lifestyle risk factors, and family history of disease. Counseling Your health care provider may ask you questions about your:  Alcohol use.  Tobacco use.  Drug use.  Emotional well-being.  Home and relationship well-being.  Sexual activity.  Eating habits.  Work and work environment.  Method of birth control.  Menstrual cycle.  Pregnancy history. Screening You may have the following tests or measurements:  Height, weight, and BMI.  Diabetes screening. This is done by checking your blood sugar (glucose) after you have not eaten for a while (fasting).  Blood pressure.  Lipid and cholesterol levels. These may be checked every 5 years starting at age 20.  Skin check.  Hepatitis C blood test.  Hepatitis B blood test.  Sexually transmitted disease (STD) testing.  BRCA-related cancer screening. This may be done if you have a family history of breast, ovarian, tubal, or peritoneal cancers.  Pelvic exam and Pap test. This may be done every 3 years starting at age 21. Starting at age 30, this may be done every 5  years if you have a Pap test in combination with an HPV test. Discuss your test results, treatment options, and if necessary, the need for more tests with your health care provider. Vaccines Your health care provider may recommend certain vaccines, such as:  Influenza vaccine. This is recommended every year.  Tetanus, diphtheria, and acellular pertussis (Tdap, Td) vaccine. You may need a Td booster every 10 years.  Varicella vaccine. You may need this if you have not been vaccinated.  HPV vaccine. If you are 26 or younger, you may need three doses over 6 months.  Measles, mumps, and rubella (MMR) vaccine. You may need at least one dose of MMR. You may also need a second dose.  Pneumococcal 13-valent conjugate (PCV13) vaccine. You may need this if you have certain conditions and were not previously vaccinated.  Pneumococcal polysaccharide (PPSV23) vaccine. You may need one or two doses if you smoke cigarettes or if you have certain conditions.  Meningococcal vaccine. One dose is recommended if you are age 19-21 years and a first-year college student living in a residence hall, or if you have one of several medical conditions. You may also need additional booster doses.  Hepatitis A vaccine. You may need this if you have certain conditions or if you travel or work in places where you may be exposed to hepatitis A.  Hepatitis B vaccine. You may need this if you have certain conditions or if you travel or work in places where you may be exposed to hepatitis B.  Haemophilus influenzae type b (Hib) vaccine. You may need this if you   have certain risk factors. Talk to your health care provider about which screenings and vaccines you need and how often you need them. This information is not intended to replace advice given to you by your health care provider. Make sure you discuss any questions you have with your health care provider. Document Released: 11/10/2001 Document Revised: 04/27/2017  Document Reviewed: 07/16/2015 Elsevier Interactive Patient Education  2019 [employer].

## 2018-10-14 NOTE — Progress Notes (Signed)
NGYN patient presents for Annual Exam.  CC: Nausea w/cycles and sometimes w/ not on cycle. States periods are sometimes irregular and heavy w/ painful cramps , 10/10x Pt takes tylenol or Ibuprofen , sometimes midol but no relief. Also notes frequent urination , and headaches.   Pt wants to discuss Tubal reversal.   Contraception: None had BTL   Last pap: > 60yrs ago.

## 2018-10-14 NOTE — Progress Notes (Addendum)
GYNECOLOGY ANNUAL PREVENTATIVE CARE ENCOUNTER NOTE  Subjective:   Lindsey Sampson is a 38 y.o. G2P0 female here for a routine annual gynecologic exam.  Current complaints: intermittent nausea and abdominal pain with menstrual cycle. She is successfully managing with OTC Dramamine.  Her history includes surgical removal of an ovarian cyst and she is concerned this may be the case again. Denies abnormal vaginal bleeding, discharge, pelvic pain, problems with intercourse or other gynecologic concerns.   Patient has a secondary concern for frequent urination. She denies dysuria, hematuria, flank pain or history of UTIs.  Patient has a new female sexual partner. They have been together three months and she is unsure of his testing status. She is requesting STI screening today.  Patient works a Regulatory affairs officer. She is a smoker, has smoking cessation as a short-term goal. She does not exercise. She endorses drinking primarily water, Sprite and Gatorade throughout the day.   Patient is s/p tubal ligation in 2010. She has questions regarding tubal reversal. She has just started considering this and would like more information.   Gynecologic History Patient's last menstrual period was 10/02/2018 (exact date). Contraception: tubal ligation Does not use condoms  Last Pap: 3-5 years ago, patient unsure. Results were: normal with negative HPV Last mammogram: N/A.   Obstetric History OB History  Gravida Para Term Preterm AB Living  2         2  SAB TAB Ectopic Multiple Live Births        0 2    # Outcome Date GA Lbr Len/2nd Weight Sex Delivery Anes PTL Lv  2 Gravida 11/20/08    M CS-LTranv   LIV  1 Gravida 08/19/05    M CS-LTranv   LIV    Past Medical History:  Diagnosis Date  . Anxiety   . Colitis   . Cyst of spleen   . Depression   . Ovarian cyst   . Renal disorder    renal cyst    Past Surgical History:  Procedure Laterality Date  . BACK SURGERY    . CESAREAN SECTION     . CESAREAN SECTION  2006 and 2010  . cyst removed from ovary Right   . LUMBAR LAMINECTOMY/DECOMPRESSION MICRODISCECTOMY Left 05/14/2016   Procedure: LUMBAR DECOMPRESSION/MICRODISCECTOMY LEFT LUMBAR FOUR-FIVE;  Surgeon: Melina Schools, MD;  Location: [facility];  Service: Orthopedics;  Laterality: Left;  . TUBAL LIGATION      Current Outpatient Medications on File Prior to Visit  Medication Sig Dispense Refill  . clonazePAM (KLONOPIN) 0.5 MG tablet Take 0.5 mg by mouth 3 (three) times daily as needed. For anxiety/sleep.  1  . docusate sodium (COLACE) 100 MG capsule Take 1 capsule (100 mg total) by mouth 2 (two) times daily. 60 capsule 0  . methocarbamol (ROBAXIN) 500 MG tablet Take 1 tablet (500 mg total) by mouth 3 (three) times daily as needed for muscle spasms. 60 tablet 0  . Multiple Vitamin (MULTIVITAMIN WITH MINERALS) TABS tablet Take 1 tablet by mouth every other day.     . ondansetron (ZOFRAN) 4 MG tablet Take 1 tablet (4 mg total) by mouth every 8 (eight) hours as needed for nausea or vomiting. 20 tablet 0  . oxyCODONE-acetaminophen (PERCOCET) 10-325 MG tablet Take 1 tablet by mouth every 4 (four) hours as needed for pain. 60 tablet 0  . polyethylene glycol (MIRALAX) packet Take 17 g by mouth 2 (two) times daily. 14 each 1  . TRINTELLIX 20  MG TABS Take 20 mg by mouth daily.  1  . [DISCONTINUED] gabapentin (NEURONTIN) 300 MG capsule Take 1 capsule (300 mg total) by mouth at bedtime. 30 capsule 0   No current facility-administered medications on file prior to visit.     Allergies  Allergen Reactions  . Prednisone Rash    Social History:  reports that she has been smoking cigarettes. She has been smoking about 0.50 packs per day. She has never used smokeless tobacco. She reports that she does not drink alcohol or use drugs.  Family History  Problem Relation Age of Onset  . Diabetes Mother   . Hypertension Mother   . High Cholesterol Mother   . Hypertension Father   . Prostate  cancer Father   . High Cholesterol Father     The following portions of the patient's history were reviewed and updated as appropriate: allergies, current medications, past family history, past medical history, past social history, past surgical history and problem list.  Review of Systems Pertinent items noted in HPI and remainder of comprehensive ROS otherwise negative.   Objective:  BP 126/82   Pulse 81   Ht 5\' 6"  (1.676 m)   Wt 167 lb (75.8 kg)   LMP 10/02/2018 (Exact Date)   BMI 26.95 kg/m  CONSTITUTIONAL: Well-developed, well-nourished female in no acute distress.  HENT:  Normocephalic, atraumatic, External right and left ear normal. Oropharynx is clear and moist EYES: Conjunctivae and EOM are normal. Pupils are equal, round, and reactive to light. No scleral icterus.  NECK: Normal range of motion, supple, no masses.  Normal thyroid.  SKIN: Skin is warm and dry. No rash noted. Not diaphoretic. No erythema. No pallor. MUSCULOSKELETAL: Normal range of motion. No tenderness.  No cyanosis, clubbing, or edema.  2+ distal pulses. NEUROLOGIC: Alert and oriented to person, place, and time. Normal reflexes, muscle tone coordination. No cranial nerve deficit noted. PSYCHIATRIC: Normal mood and affect. Normal behavior. Normal judgment and thought content. CARDIOVASCULAR: Normal heart rate noted, regular rhythm RESPIRATORY: Clear to auscultation bilaterally. Effort and breath sounds normal, no problems with respiration noted. BREASTS: Symmetric in size. No masses, skin changes, nipple drainage, or lymphadenopathy. ABDOMEN: Soft, normal bowel sounds, no distention noted.  No tenderness, rebound or guarding.  PELVIC: Normal appearing external genitalia; normal appearing vaginal mucosa and cervix.  No abnormal discharge noted.  Pap smear obtained.  Normal uterine size, no other palpable masses, no uterine or adnexal tenderness.    Assessment and Plan:  1. Well woman exam with routine  gynecological exam - Reviewed recommendations for smoking cessation and 30-45 minutes of physical activity 4-5 days per week - Outpatient pelvic imaging ordered - [facility] - Hepatitis B surface antigen - Hepatitis C antibody - HIV antibody (with reflex) - CBC - HgB A1c  2. Screening for cervical cancer  - Cytology - PAP  3. Frequent urination  - Urine Culture  Will follow up results of pap smear and manage accordingly. Routine preventative health maintenance measures emphasized. Please refer to After Visit Summary for other counseling recommendations.   Greater than 50% of 45 minutes visit spent in counseling and coordination of care.  Mallie Snooks, [place] for [employer], [place]

## 2018-10-15 LAB — HEPATITIS C ANTIBODY: Hep C Virus Ab: <0.1 s/co ratio (ref 0.0–0.9)

## 2018-10-15 LAB — CBC
Hematocrit: 40.7 % (ref 34.0–46.6)
Hemoglobin: 13.9 g/dL (ref 11.1–15.9)
MCH: 32.8 pg (ref 26.6–33.0)
MCHC: 34.2 g/dL (ref 31.5–35.7)
MCV: 96 fL (ref 79–97)
PLATELETS: 264 10*3/uL (ref 150–450)
RBC: 4.24 x10E6/uL (ref 3.77–5.28)
RDW: 12 % (ref 11.7–15.4)
WBC: 6.9 10*3/uL (ref 3.4–10.8)

## 2018-10-15 LAB — RPR: RPR Ser Ql: NONREACTIVE

## 2018-10-15 LAB — HIV ANTIBODY (ROUTINE TESTING W REFLEX): HIV Screen 4th Generation wRfx: NONREACTIVE

## 2018-10-15 LAB — HEPATITIS B SURFACE ANTIGEN: HEP B S AG: NEGATIVE

## 2018-10-15 LAB — HEMOGLOBIN A1C
ESTIMATED AVERAGE GLUCOSE: 137 mg/dL
Hgb A1c MFr Bld: 6.4 % — ABNORMAL HIGH (ref 4.8–5.6)

## 2018-10-16 LAB — URINE CULTURE: Organism ID, Bacteria: NO GROWTH

## 2018-10-19 LAB — CYTOLOGY - PAP
DIAGNOSIS: NEGATIVE
HPV (WINDOPATH): NOT DETECTED

## 2018-10-20 ENCOUNTER — Ambulatory Visit (HOSPITAL_COMMUNITY)
Admission: RE | Admit: 2018-10-20 | Discharge: 2018-10-20 | Disposition: A | Discharge status: Home / Self Care | Payer: Medicaid Other | Source: Ambulatory Visit | Attending: Advanced Practice Midwife | Admitting: Advanced Practice Midwife

## 2018-10-20 DIAGNOSIS — Z9889 Other specified postprocedural states: Secondary | ICD-10-CM | POA: Insufficient documentation

## 2018-10-20 DIAGNOSIS — Z8742 Personal history of other diseases of the female genital tract: Secondary | ICD-10-CM | POA: Insufficient documentation

## 2019-03-29 ENCOUNTER — Other Ambulatory Visit: Payer: Self-pay

## 2019-03-29 ENCOUNTER — Ambulatory Visit (INDEPENDENT_AMBULATORY_CARE_PROVIDER_SITE_OTHER): Payer: Self-pay

## 2019-03-29 ENCOUNTER — Ambulatory Visit (INDEPENDENT_AMBULATORY_CARE_PROVIDER_SITE_OTHER): Payer: Medicaid Other

## 2019-03-29 ENCOUNTER — Ambulatory Visit
Admission: EM | Admit: 2019-03-29 | Discharge: 2019-03-29 | Disposition: A | Discharge status: Home / Self Care | Payer: Self-pay

## 2019-03-29 DIAGNOSIS — W19XXXA Unspecified fall, initial encounter: Secondary | ICD-10-CM

## 2019-03-29 DIAGNOSIS — M79602 Pain in left arm: Secondary | ICD-10-CM

## 2019-03-29 DIAGNOSIS — M79622 Pain in left upper arm: Secondary | ICD-10-CM

## 2019-03-29 DIAGNOSIS — M79632 Pain in left forearm: Secondary | ICD-10-CM

## 2019-03-29 DIAGNOSIS — M79645 Pain in left finger(s): Secondary | ICD-10-CM

## 2019-03-29 MED ORDER — IBUPROFEN 800 MG PO TABS: 800.0000 mg | ORAL_TABLET | Freq: Two times a day (BID) | ORAL | 0 refills | Status: DC | PRN

## 2019-03-29 NOTE — Discharge Instructions (Addendum)
X-rays showed did not show fracture or dislocation Sling and wrist splint placed.  Wear as needed for comfort.   Continue conservative management of rest, ice, and elevation Take ibuprofen 800 mg as needed for pain relief (may cause abdominal discomfort, ulcers, and GI bleeds avoid taking with other NSAIDs as well as trintellix) Follow up with orthopedist for further evaluation and management Return or go to the ER if you have any new or worsening symptoms (fever, chills, chest pain, abdominal pain, skin changes, worsening symtpoms despite treatment, etc...)

## 2019-03-29 NOTE — ED Provider Notes (Signed)
[facility]   [id number] 03/29/19 Arrival Time: 2952  CC: Left arm pain  SUBJECTIVE: History from: patient. Lindsey Sampson is a 38 y.o. female hx significant for anxiety, colitis, cyst on spleen, depression, ovarian cyst, and renal disorder, complains of left arm pain that began 1 day ago.  Symptoms began after she fell on her left forearm/ elbow on concrete. Pain is diffuse about the upper arm, forearm, and small finger.  Describes the pain as constant and "10"/10.  Has tried OTC medications and icing without relief.  Symptoms are made worse with forearm and small finger ROM.  Reports previous arm fracture as a child, unsure where.  Complains of forearm swelling, and weakness.  Denies fever, chills, erythema, ecchymosis.  ROS: As per HPI.  Past Medical History:  Diagnosis Date  . Anxiety   . Colitis   . Cyst of spleen   . Depression   . Ovarian cyst   . Renal disorder    renal cyst   Past Surgical History:  Procedure Laterality Date  . BACK SURGERY    . CESAREAN SECTION    . CESAREAN SECTION  2006 and 2010  . cyst removed from ovary Right   . LUMBAR LAMINECTOMY/DECOMPRESSION MICRODISCECTOMY Left 05/14/2016   Procedure: LUMBAR DECOMPRESSION/MICRODISCECTOMY LEFT LUMBAR FOUR-FIVE;  Surgeon: Melina Schools, MD;  Location: [facility];  Service: Orthopedics;  Laterality: Left;  . TUBAL LIGATION     Allergies  Allergen Reactions  . Prednisone Rash   No current facility-administered medications on file prior to encounter.    Current Outpatient Medications on File Prior to Encounter  Medication Sig Dispense Refill  . Multiple Vitamin (MULTIVITAMIN WITH MINERALS) TABS tablet Take 1 tablet by mouth every other day.     Marland Kitchen buPROPion (WELLBUTRIN) 75 MG tablet Take 75 mg by mouth daily.    . promethazine (PHENERGAN) 25 MG tablet Take 25 mg by mouth daily as needed.    . simvastatin (ZOCOR) 40 MG tablet Take 40 mg by mouth at bedtime.    . TRINTELLIX 20 MG TABS Take 20 mg by mouth  daily.  1  . [DISCONTINUED] clonazePAM (KLONOPIN) 0.5 MG tablet Take 0.5 mg by mouth 3 (three) times daily as needed. For anxiety/sleep.  1   Social History   Socioeconomic History  . Marital status: Single    Spouse name: Not on file  . Number of children: Not on file  . Years of education: Not on file  . Highest education level: Not on file  Occupational History  . Not on file  Social Needs  . Financial resource strain: Not on file  . Food insecurity    Worry: Not on file    Inability: Not on file  . Transportation needs    Medical: Not on file    Non-medical: Not on file  Tobacco Use  . Smoking status: Light Tobacco Smoker    Packs/day: 0.50    Types: Cigarettes  . Smokeless tobacco: Never Used  . Tobacco comment: trying to quit   Substance and Sexual Activity  . Alcohol use: No  . Drug use: No  . Sexual activity: Yes    Partners: Male    Birth control/protection: Surgical  Lifestyle  . Physical activity    Days per week: Not on file    Minutes per session: Not on file  . Stress: Not on file  Relationships  . Social connections    Talks on phone: Not on file  Gets together: Not on file    Attends religious service: Not on file    Active member of club or organization: Not on file    Attends meetings of clubs or organizations: Not on file    Relationship status: Not on file  . Intimate partner violence    Fear of current or ex partner: Not on file    Emotionally abused: Not on file    Physically abused: Not on file    Forced sexual activity: Not on file  Other Topics Concern  . Not on file  Social History Narrative  . Not on file   Family History  Problem Relation Age of Onset  . Diabetes Mother   . Hypertension Mother   . High Cholesterol Mother   . Hypertension Father   . Prostate cancer Father   . High Cholesterol Father     OBJECTIVE:  Vitals:   03/29/19 1719  BP: 128/82  Pulse: 75  Resp: 18  Temp: 98.3 F (36.8 C)  SpO2: 98%     General appearance: ALERT; in no acute distress.  Head: NCAT Lungs: Normal respiratory effort CV: radial pulse 2+ bilaterally. Cap refill < 2 seconds Musculoskeletal: Left arm Inspection: mild swelling over proximal lateral forearm Palpation: TTP over mid humerus, proximal radius, distal ulna, and fifth phalanx ROM: FROM about shoulder, LROM about elbow, wrist, and fifth finger Strength: 4+/5 elbow flexion, 4+/5 elbow extension, 4+/5 grip strength Skin: warm and dry; sun burn Neurologic: Ambulates without difficulty; Sensation intact about the upper extremities Psychological: alert and cooperative; normal mood and affect  DIAGNOSTIC STUDIES:  Dg Forearm Left  Result Date: 03/29/2019 CLINICAL DATA:  Fall. EXAM: LEFT FOREARM - 2 VIEW COMPARISON:  None. FINDINGS: There is no evidence of fracture or other focal bone lesions. Soft tissues are unremarkable. IMPRESSION: Negative. Electronically Signed   By: Constance Holster M.D.   On: 03/29/2019 17:53   Dg Humerus Left  Result Date: 03/29/2019 CLINICAL DATA:  Pain EXAM: LEFT HUMERUS - 2+ VIEW COMPARISON:  None. FINDINGS: There is no evidence of fracture or other focal bone lesions. Soft tissues are unremarkable. IMPRESSION: Negative. Electronically Signed   By: Constance Holster M.D.   On: 03/29/2019 17:55   Dg Finger Little Left  Result Date: 03/29/2019 CLINICAL DATA:  Pain EXAM: LEFT LITTLE FINGER 2+V COMPARISON:  None. FINDINGS: There is no evidence of fracture or dislocation. There is no evidence of arthropathy or other focal bone abnormality. Soft tissues are unremarkable. IMPRESSION: Negative. Electronically Signed   By: Constance Holster M.D.   On: 03/29/2019 17:54     ASSESSMENT & PLAN:  1. Fall, initial encounter   2. Left arm pain     Meds ordered this encounter  Medications  . ibuprofen (ADVIL) 800 MG tablet    Sig: Take 1 tablet (800 mg total) by mouth 2 (two) times daily as needed.    Dispense:  30 tablet    Refill:  0     Order Specific Question:   Supervising Provider    Answer:   Raylene Everts [[id number]]   X-rays did not show fracture or dislocation Sling and wrist splint placed.  Wear as needed for comfort.   Continue conservative management of rest, ice, and elevation Take ibuprofen 800 mg as needed for pain relief (may cause abdominal discomfort, ulcers, and GI bleeds avoid taking with other NSAIDs) Follow up with orthopedist for further evaluation and management Return or go to the ER if you  have any new or worsening symptoms (fever, chills, chest pain, abdominal pain, skin changes, worsening symtpoms despite treatment, etc...)   Reviewed expectations re: course of current medical issues. Questions answered. Outlined signs and symptoms indicating need for more acute intervention. Patient verbalized understanding. After Visit Summary given.    Lestine Box, PA-C 03/29/19 [phone]

## 2019-03-29 NOTE — ED Triage Notes (Signed)
Pt fell on left arm yesterday, deformity noted to left forearm

## 2019-05-16 ENCOUNTER — Telehealth: Payer: Medicaid Other | Admitting: Family

## 2019-05-16 ENCOUNTER — Other Ambulatory Visit: Payer: Self-pay

## 2019-05-16 DIAGNOSIS — Z20822 Contact with and (suspected) exposure to covid-19: Secondary | ICD-10-CM

## 2019-05-16 NOTE — Progress Notes (Signed)
E-Visit for Corona Virus Screening   Your current symptoms could be consistent with the coronavirus.  Many health care providers can now test patients at their office but not all are.  [facility] has multiple testing sites. For information on our COVID testing locations and hours go to [email or web address]  Please quarantine yourself while awaiting your test results.  We are enrolling you in our [facility] for [place] . Daily you will receive a questionnaire within the [facility] website. Our COVID 19 response team willl be monitoriing your responses daily.    COVID-19 is a respiratory illness with symptoms that are similar to the flu. Symptoms are typically mild to moderate, but there have been cases of severe illness and death due to the virus. The following symptoms may appear 2-14 days after exposure: . Fever . Cough . Shortness of breath or difficulty breathing . Chills . Repeated shaking with chills . Muscle pain . Headache . Sore throat . New loss of taste or smell . Fatigue . Congestion or runny nose . Nausea or vomiting . Diarrhea  It is vitally important that if you feel that you have an infection such as this virus or any other virus that you stay home and away from places where you may spread it to others.  You should self-quarantine for 14 days if you have symptoms that could potentially be coronavirus or have been in close contact a with a person diagnosed with COVID-19 within the last 2 weeks. You should avoid contact with people age 27 and older.   You should wear a mask or cloth face covering over your nose and mouth if you must be around other people or animals, including pets (even at home). Try to stay at least 6 feet away from other people. This will protect the people around you.  You may also take acetaminophen (Tylenol) as needed for fever.   Reduce your risk of any infection by using the same precautions used for avoiding the  common cold or flu:  Marland Kitchen Wash your hands often with soap and warm water for at least 20 seconds.  If soap and water are not readily available, use an alcohol-based hand sanitizer with at least 60% alcohol.  . If coughing or sneezing, cover your mouth and nose by coughing or sneezing into the elbow areas of your shirt or coat, into a tissue or into your sleeve (not your hands). . Avoid shaking hands with others and consider head nods or verbal greetings only. . Avoid touching your eyes, nose, or mouth with unwashed hands.  . Avoid close contact with people who are sick. . Avoid places or events with large numbers of people in one location, like concerts or sporting events. . Carefully consider travel plans you have or are making. . If you are planning any travel outside or inside the [place], visit the CDC's Travelers' Health webpage for the latest health notices. . If you have some symptoms but not all symptoms, continue to monitor at home and seek medical attention if your symptoms worsen. . If you are having a medical emergency, call 911.  HOME CARE . Only take medications as instructed by your medical team. . Drink plenty of fluids and get plenty of rest. . A steam or ultrasonic humidifier can help if you have congestion.   GET HELP RIGHT AWAY IF YOU HAVE EMERGENCY WARNING SIGNS** FOR COVID-19. If you or someone is showing any of these signs seek emergency medical care immediately. Call  911 or proceed to your closest emergency facility if: . You develop worsening high fever. . Trouble breathing . Bluish lips or face . Persistent pain or pressure in the chest . New confusion . Inability to wake or stay awake . You cough up blood. . Your symptoms become more severe  **This list is not all possible symptoms. Contact your medical provider for any symptoms that are sever or concerning to you.   MAKE SURE YOU   Understand these instructions.  Will watch your condition.  Will get help right  away if you are not doing well or get worse.  Your e-visit answers were reviewed by a board certified advanced clinical practitioner to complete your personal care plan.  Depending on the condition, your plan could have included both over the counter or prescription medications.  If there is a problem please reply once you have received a response from your provider.  Your safety is important to [place].  If you have drug allergies check your prescription carefully.    You can use MyChart to ask questions about today's visit, request a non-urgent call back, or ask for a work or school excuse for 24 hours related to this e-Visit. If it has been greater than 24 hours you will need to follow up with your provider, or enter a new e-Visit to address those concerns. You will get an e-mail in the next two days asking about your experience.  I hope that your e-visit has been valuable and will speed your recovery. Thank you for using e-visits.   Greater than 5 minutes, yet less than 10 minutes of time have been spent researching, coordinating, and implementing care for this patient today.  Thank you for the details you included in the comment boxes. Those details are very helpful in determining the best course of treatment for you and help [place] to provide the best care.

## 2019-05-17 LAB — NOVEL CORONAVIRUS, NAA: SARS-CoV-2, NAA: NOT DETECTED

## 2019-05-31 ENCOUNTER — Other Ambulatory Visit (HOSPITAL_COMMUNITY): Payer: Self-pay | Admitting: Sports Medicine

## 2019-05-31 DIAGNOSIS — R109 Unspecified abdominal pain: Secondary | ICD-10-CM

## 2019-06-06 ENCOUNTER — Ambulatory Visit (HOSPITAL_COMMUNITY)
Admission: RE | Admit: 2019-06-06 | Discharge: 2019-06-06 | Disposition: A | Discharge status: Home / Self Care | Payer: Self-pay | Source: Ambulatory Visit | Attending: Sports Medicine | Admitting: Sports Medicine

## 2019-06-06 ENCOUNTER — Other Ambulatory Visit: Payer: Self-pay

## 2019-06-06 DIAGNOSIS — R109 Unspecified abdominal pain: Secondary | ICD-10-CM | POA: Insufficient documentation

## 2019-07-14 ENCOUNTER — Encounter: Payer: Self-pay | Admitting: Gastroenterology

## 2019-07-26 ENCOUNTER — Other Ambulatory Visit: Payer: Self-pay

## 2019-07-26 ENCOUNTER — Ambulatory Visit (INDEPENDENT_AMBULATORY_CARE_PROVIDER_SITE_OTHER): Payer: Self-pay | Admitting: Gastroenterology

## 2019-07-26 ENCOUNTER — Encounter: Payer: Self-pay | Admitting: Gastroenterology

## 2019-07-26 VITALS — BP 109/78 | HR 76 | Temp 97.1°F | Ht 66.0 in | Wt 169.4 lb

## 2019-07-26 DIAGNOSIS — R6881 Early satiety: Secondary | ICD-10-CM | POA: Insufficient documentation

## 2019-07-26 DIAGNOSIS — K279 Peptic ulcer, site unspecified, unspecified as acute or chronic, without hemorrhage or perforation: Secondary | ICD-10-CM | POA: Insufficient documentation

## 2019-07-26 DIAGNOSIS — R197 Diarrhea, unspecified: Secondary | ICD-10-CM

## 2019-07-26 DIAGNOSIS — K769 Liver disease, unspecified: Secondary | ICD-10-CM | POA: Insufficient documentation

## 2019-07-26 DIAGNOSIS — R112 Nausea with vomiting, unspecified: Secondary | ICD-10-CM

## 2019-07-26 MED ORDER — PANTOPRAZOLE SODIUM 40 MG PO TBEC: 40.0000 mg | DELAYED_RELEASE_TABLET | Freq: Every day | ORAL | 3 refills | Status: DC

## 2019-07-26 NOTE — Patient Instructions (Addendum)
Please have labs completed.  We will get you scheduled for an upper endoscopy in the near future with Dr. Oneida Alar to evaluate your nausea.  Please start Protonix 40 mg 30 minutes before breakfast.  Please let me know if this causes worsening diarrhea.  Please stop taking Excedrin and try to use Tylenol for headaches.  Avoid all NSAIDs including ibuprofen, Aleve, Advil, and Goody powders.  Please follow a lactose-free diet for the next 2 weeks to see if this helps with your diarrhea.  If you are going out to eat or may consume dairy inadvertently, please take Lactaid pills prior to your meal.  See handout below.  We will see you back in the office after your endoscopy.  Call if you have questions or concerns prior to your next visit.  Aliene Altes, PA-C [facility]       Lactose Intolerance, Adult Lactose is a natural sugar that is found in dairy milk and dairy products such as cheese and yogurt. Lactose is digested by lactase, a protein (enzyme) in your small intestine. Some people do not produce enough lactase to digest lactose. This is called lactose intolerance. Lactose intolerance is different from milk allergy, which is a more serious reaction to the protein in milk. What are the causes? Causes of lactose intolerance may include:  Normal aging. The ability to produce lactase may lessen with age, causing lactose intolerance over time.  Being born without the ability to make lactase.  Digestive diseases such as gastroenteritis or inflammatory bowel disease (IBD).  Surgery or injury to your small intestine.  Infection in your intestines.  Certain antibiotic medicines and cancer treatments. What are the signs or symptoms? Lactose intolerance can cause discomfort within 30 minutes to 2 hours after you eat or drink something that contains lactose. Symptoms may include:  Nausea.  Diarrhea.  Cramps or pain in the abdomen.  A full, tight, or painful feeling  in the abdomen (bloating).  Gas. How is this diagnosed? This condition may be diagnosed based on:  Your symptoms and medical history.  Lactose tolerance test. This test involves drinking a lactose solution and then having blood tests to measure the amount of glucose in your blood. If your blood glucose level does not go up, it means your body is not able to digest the lactose.  Lactose breath test (hydrogen breath test). This test involves drinking a lactose solution and then exhaling into a type of bag while you digest the solution. Having a lot of hydrogen in your breath can be a sign of lactose intolerance.  Stool acidity test. This involves drinking a lactose solution and then having your stool samples tested for bacteria. Having a lot of bacteria causes stool to be considered acidic, which is a sign of lactose intolerance. How is this treated? There is no treatment to improve your body's ability to produce lactase. However, you can manage your symptoms at home by:  Limiting or avoiding dairy milk, dairy products, and other sources of lactose.  Taking lactase tablets when you eat milk products. Lactase tablets are over-the-counter medicines that help to improve lactose digestion. You may also add lactase drops to regular milk.  Adjusting your diet, such as drinking lactose-free milk. Lactose tolerance varies from person to person. Some people may be able to eat or drink small amounts of products that contain lactose, and other people may need to avoid all foods and drinks that contain lactose. Talk with your health care provider about what treatment  is best for you. Follow these instructions at home:  Limit or avoid foods, beverages, and medicines that contain lactose, as told by your health care provider. Keep track of which foods, beverages, or medicines cause symptoms so you can decide what to avoid in the future.  Read food and medicine labels carefully. Avoid products that  contain: ? Lactose. ? Milk solids. ? Casein. ? Whey.  Take over-the-counter and prescription medicines (including lactase tablets) only as told by your health care provider.  If you stop eating and drinking dairy products (eliminate dairy from your diet), make sure to get enough protein, calcium, and vitamin D from other foods. Work with your health care provider or a diet and nutrition specialist (dietitian) to make sure you get enough of those nutrients.  Choose a milk substitute that is fortified with calcium and vitamin D. ? Soy milk contains high-quality protein. ? Milks that are made from nuts or grains (such as almond milk and rice milk) contain very small amounts of protein.  Keep all follow-up visits as told by your health care provider. This is important. Contact a health care provider if:  You have no relief from your symptoms after you have eliminated milk products and other sources of lactose. Get help right away if:  You have blood in your stool.  You have severe abdomen (abdominal) pain. Summary  Lactose is a natural sugar that is found in dairy milk and dairy products such as cheese and yogurt. Lactose is digested by lactase, which is a protein (enzyme) in the small intestine.  Some people do not produce enough lactase to digest lactose. This is called lactose intolerance.  Lactose intolerance can cause discomfort within 30 minutes to 2 hours after you eat or drink something that contains lactose.  Limit or avoid foods, beverages, and medicines that contain lactose, as told by your health care provider. This information is not intended to replace advice given to you by your health care provider. Make sure you discuss any questions you have with your health care provider. Document Released: 09/14/2005 Document Revised: 08/27/2017 Document Reviewed: 04/30/2017 Elsevier Patient Education  2020 [employer].

## 2019-07-26 NOTE — Progress Notes (Addendum)
REVIEWED-NO ADDITIONAL RECOMMENDATIONS.  Referring Provider: [name], MD Primary Care Physician:  [name], MD Primary Gastroenterologist:  Dr. Oneida Alar  Chief Complaint  Patient presents with  . Abdominal Cramping    last episode 1 week ago, can have lower/upper abd pain. No matter what she eats her stomach is always upset  . Nausea    daily with occas vomiting    HPI:   Lindsey Sampson is a 38 y.o. female presenting today at the request of [name], MD for abdominal pain and nausea.   Right upper quadrant ultrasound on 06/06/2019 revealed gallbladder without gallstones or wall thickening.  CBD within normal limits.  Focal area of increased echogenicity in the right lobe of the liver near the gallbladder fossa that may represent localized fat but may also represent focal hemangioma.  Recommended follow-up ultrasound in 1 year to assess for stability.  Small cyst of the right kidney.  Otherwise unremarkable.  Today she states she has nausea all the time. Present for 3 years. Worsening over last year. Now daily.  Occasional vomiting.  No hematemesis. Nausea may last 30 minutes to all day at times.  Occurs any time. Sometimes at night making it difficult to go to sleep.  Fried foods and spicy foods will cause nausea. Wonders if period worsens nausea. No heartburn, reflux symptoms, or dysphagia. Rare upper abdominal pain. Stopped BC powders over 1 year ago. Taking Excedrine every other day or a couple a day. Has been taking for a few years. No unintentional weight loss. Early satiety at times. Was placed on omeprazole. Took for 1 week. Caused worsening diarrhea so she stopped. Didn't notice improvement in nausea.    States she has always gotten an upset stomach easily. If eating the wrong thing will have diarrhea.Typically BMs soft and formed and daily. 1-2 a day. Diarrhea about once a week. [place], [place], and [place], and pizza. When stools are loose, she may have 4-5.  Mushy to watery. Will take imodium. After the day of diarrhea, this doesn't continue. No blood in the stool or black stool. Cramping abdominal pain with diarrhea resolved after a BM. No milk, occasional cheese 1-2 times a week, rare yogurt, no coffee creamer. Cooks with butter.   Reports grandmother has IBS. No family history of celiac disease, IBD, or colon cancer.  Past Medical History:  Diagnosis Date  . Anxiety   . Colitis   . Cyst of spleen   . Depression   . Ovarian cyst   . Renal disorder    renal cyst    Past Surgical History:  Procedure Laterality Date  . BACK SURGERY    . CESAREAN SECTION    . CESAREAN SECTION  2006 and 2010  . cyst removed from ovary Right   . LUMBAR LAMINECTOMY/DECOMPRESSION MICRODISCECTOMY Left 05/14/2016   Procedure: LUMBAR DECOMPRESSION/MICRODISCECTOMY LEFT LUMBAR FOUR-FIVE;  Surgeon: Melina Schools, MD;  Location: [facility];  Service: Orthopedics;  Laterality: Left;  . TUBAL LIGATION      Current Outpatient Medications  Medication Sig Dispense Refill  . buPROPion (WELLBUTRIN) 100 MG tablet Take 100 mg by mouth daily.     Marland Kitchen loperamide (IMODIUM) 2 MG capsule Take by mouth as needed for diarrhea or loose stools.    . Multiple Vitamin (MULTIVITAMIN WITH MINERALS) TABS tablet Take 1 tablet by mouth as needed.     . ondansetron (ZOFRAN) 8 MG tablet 3 (three) times daily as needed.    . pantoprazole (PROTONIX) 40 MG tablet Take  1 tablet (40 mg total) by mouth daily. 90 tablet 3   No current facility-administered medications for this visit.     Allergies as of 07/26/2019 - Review Complete 07/26/2019  Allergen Reaction Noted  . Prednisone Rash 02/22/2016    Family History  Problem Relation Age of Onset  . Diabetes Mother   . Hypertension Mother   . High Cholesterol Mother   . Hypertension Father   . Prostate cancer Father   . High Cholesterol Father   . Colon polyps Maternal Grandmother   . Colon cancer Neg Hx     Social History    Socioeconomic History  . Marital status: Single    Spouse name: Not on file  . Number of children: Not on file  . Years of education: Not on file  . Highest education level: Not on file  Occupational History  . Not on file  Social Needs  . Financial resource strain: Not on file  . Food insecurity    Worry: Not on file    Inability: Not on file  . Transportation needs    Medical: Not on file    Non-medical: Not on file  Tobacco Use  . Smoking status: Light Tobacco Smoker    Packs/day: 0.50    Types: Cigarettes  . Smokeless tobacco: Never Used  . Tobacco comment: trying to quit   Substance and Sexual Activity  . Alcohol use: Yes    Comment: rare  . Drug use: No  . Sexual activity: Yes    Partners: Male    Birth control/protection: Surgical  Lifestyle  . Physical activity    Days per week: Not on file    Minutes per session: Not on file  . Stress: Not on file  Relationships  . Social Herbalist on phone: Not on file    Gets together: Not on file    Attends religious service: Not on file    Active member of club or organization: Not on file    Attends meetings of clubs or organizations: Not on file    Relationship status: Not on file  . Intimate partner violence    Fear of current or ex partner: Not on file    Emotionally abused: Not on file    Physically abused: Not on file    Forced sexual activity: Not on file  Other Topics Concern  . Not on file  Social History Narrative  . Not on file    Review of Systems: Gen: Denies any fever, chills, lightheadedness, dizziness, or feeling like she will pass out. HEENT: Denies nasal congestion or sore throat CV: Denies chest pain, heart palpitations Resp: Denies shortness of breath at rest or with exertion. Denies cough.  GI: See HPI GU : Denies urinary burning, urinary frequency, urinary hesitancy MS: Denies joint pain Derm: Denies rash Psych: Denies depression, anxiety Heme: Denies bruising, bleeding   Physical Exam: BP 109/78   Pulse 76   Temp (!) 97.1 F (36.2 C) (Oral)   Ht 5\' 6"  (1.676 m)   Wt 169 lb 6.4 oz (76.8 kg)   LMP 07/11/2019   BMI 27.34 kg/m  General:   Alert and oriented. Pleasant and cooperative. Well-nourished and well-developed.  Head:  Normocephalic and atraumatic. Eyes:  Without icterus, sclera clear and conjunctiva pink.  Ears:  Normal auditory acuity. Nose:  No deformity, discharge,  or lesions. Lungs:  Clear to auscultation bilaterally. No wheezes, rales, or rhonchi. No distress.  Heart:  S1, S2 present without murmurs appreciated.  Abdomen:  +BS, soft, non-tender and non-distended. No HSM noted. No guarding or rebound. No masses appreciated.  Rectal:  Deferred  Msk:  Symmetrical without gross deformities. Normal posture. Extremities:  Without edema. Neurologic:  Alert and  oriented x4;  grossly normal neurologically. Skin:  Intact without significant lesions or rashes. Psych:  Normal mood and affect.

## 2019-07-30 NOTE — Assessment & Plan Note (Signed)
Addressed under nausea with vomiting 

## 2019-07-30 NOTE — Assessment & Plan Note (Addendum)
38 year old female with chronic intermittent nausea x3 years that has been worsening over the last 1 year, now with nausea daily with occasional vomiting.  Also with early satiety at times and rare upper abdominal pain.  Denies heartburn, reflux symptoms, dysphagia, BRBPR, melena, or unintentional weight loss.[place] and spicy foods trigger nausea.  Stopped BC powders over 1 year ago.  Takes Excedrin migraine every other day-2 a day.  Tried omeprazole for 1 week, but this caused diarrhea and no improvement in nausea.  Recent ultrasound on 06/06/2019 with gallbladder without gallstones or wall thickening and CBD within normal limits. Benign abdominal exam.   Differentials include atypical presentation of GERD vs gastritis, esophagitis, duodenitis, or possible gastroparesis vs pyloric stenosis. Doubt malignancy.    Proceed with EGD with Dr. Oneida Alar in the near future for further evaluation. The risks, benefits, and alternatives have been discussed in detail with patient. They have stated understanding and desire to proceed.  Start Protonix 40 mg daily. Stop Excedrin and avoid all NSAIDs.  Try Tylenol for headaches. Follow-up after EGD.  Call if questions or concerns prior to next visit.

## 2019-07-30 NOTE — Assessment & Plan Note (Addendum)
38 year old female who reports long history of intermittent diarrhea depending on what she eats.  No significant change in symptoms. States, "I have always gotten an upset stomach easily."  Typically 1-2 soft formed BMs daily.  Diarrhea triggers include [place], [place], [place], and pizza.  With diarrhea, may have 4-5 mushy/watery BMs in 1 day.  Associated lower abdominal cramping that resolves after BMs.  Imodium resolves symptoms.  She does consume dairy products including cheese and butter.  Denies bright red blood per rectum, melena, or unintentional weight loss. Abdominal exam is benign.  No family history of celiac disease, IBD, or colon cancer.  Reports grandmother has IBS.    Suspect diarrhea is likely secondary to a dietary intolerance versus IBS.  Check celiac serologies. Advise she follow a lactose-free diet for the next 2 weeks. Handout provided. If she may inadvertently consume dairy, she was advised to take Lactaid pills prior to her meals.

## 2019-07-30 NOTE — Assessment & Plan Note (Signed)
Ultrasound on 06/06/2027 to evaluate for nausea revealed gallbladder without gallstones or wall thickening, CBD within normal limits, focal area of increased echogenicity in the right lobe of the liver near the gallbladder fossa that may represent localized fat but may also represent focal hemangioma.  Recommended follow-up ultrasound in 1 year to assess for stability.  Nic patient for 1 year ultrasound follow-up.  Unclear if PCP is taking care of this.

## 2019-08-26 ENCOUNTER — Telehealth: Payer: Self-pay | Admitting: Gastroenterology

## 2019-08-26 NOTE — Telephone Encounter (Signed)
Labs received from PCP dated 06/23/2019.  CMP: Glucose 93, creatinine 0.84, sodium 139, potassium 4.9, chloride 107, calcium 9.2, albumin 4.6, total bilirubin 0.2, alk phos 49, AST 10, ALT 9 CBC: WBC 6.7, hemoglobin 13.6, hematocrit 39.7, MCV 95.7, MCH 32.8, MCHC 34.3, platelet count 221 Lipase 20 Amylase 35 Urinalysis without evidence of infection.  No further recommendations at this time.  Proceed with EGD for nausea, vomiting, and early satiety as scheduled.

## 2019-09-06 ENCOUNTER — Other Ambulatory Visit: Payer: Self-pay

## 2019-09-06 ENCOUNTER — Emergency Department (HOSPITAL_COMMUNITY): Payer: No Typology Code available for payment source

## 2019-09-06 ENCOUNTER — Inpatient Hospital Stay (HOSPITAL_COMMUNITY)
Admission: EM | Admit: 2019-09-06 | Discharge: 2019-09-07 | DRG: 505 | Disposition: A | Discharge status: Home / Self Care | Payer: No Typology Code available for payment source | Attending: Student | Admitting: Student

## 2019-09-06 ENCOUNTER — Encounter (HOSPITAL_COMMUNITY): Payer: Self-pay

## 2019-09-06 DIAGNOSIS — W458XXA Other foreign body or object entering through skin, initial encounter: Secondary | ICD-10-CM | POA: Diagnosis present

## 2019-09-06 DIAGNOSIS — Z20828 Contact with and (suspected) exposure to other viral communicable diseases: Secondary | ICD-10-CM | POA: Diagnosis present

## 2019-09-06 DIAGNOSIS — W208XXA Other cause of strike by thrown, projected or falling object, initial encounter: Secondary | ICD-10-CM | POA: Diagnosis present

## 2019-09-06 DIAGNOSIS — F17210 Nicotine dependence, cigarettes, uncomplicated: Secondary | ICD-10-CM | POA: Diagnosis present

## 2019-09-06 DIAGNOSIS — Z23 Encounter for immunization: Secondary | ICD-10-CM

## 2019-09-06 DIAGNOSIS — M79674 Pain in right toe(s): Secondary | ICD-10-CM | POA: Diagnosis present

## 2019-09-06 DIAGNOSIS — T148XXA Other injury of unspecified body region, initial encounter: Secondary | ICD-10-CM

## 2019-09-06 DIAGNOSIS — Z79899 Other long term (current) drug therapy: Secondary | ICD-10-CM | POA: Diagnosis not present

## 2019-09-06 DIAGNOSIS — Z8249 Family history of ischemic heart disease and other diseases of the circulatory system: Secondary | ICD-10-CM | POA: Diagnosis not present

## 2019-09-06 DIAGNOSIS — Y9269 Other specified industrial and construction area as the place of occurrence of the external cause: Secondary | ICD-10-CM | POA: Diagnosis not present

## 2019-09-06 DIAGNOSIS — Z8371 Family history of colonic polyps: Secondary | ICD-10-CM

## 2019-09-06 DIAGNOSIS — Z833 Family history of diabetes mellitus: Secondary | ICD-10-CM | POA: Diagnosis not present

## 2019-09-06 DIAGNOSIS — Z419 Encounter for procedure for purposes other than remedying health state, unspecified: Secondary | ICD-10-CM

## 2019-09-06 DIAGNOSIS — F419 Anxiety disorder, unspecified: Secondary | ICD-10-CM | POA: Diagnosis present

## 2019-09-06 DIAGNOSIS — Z9851 Tubal ligation status: Secondary | ICD-10-CM | POA: Diagnosis not present

## 2019-09-06 DIAGNOSIS — Z8042 Family history of malignant neoplasm of prostate: Secondary | ICD-10-CM | POA: Diagnosis not present

## 2019-09-06 DIAGNOSIS — Z888 Allergy status to other drugs, medicaments and biological substances status: Secondary | ICD-10-CM

## 2019-09-06 DIAGNOSIS — S92411B Displaced fracture of proximal phalanx of right great toe, initial encounter for open fracture: Principal | ICD-10-CM | POA: Diagnosis present

## 2019-09-06 DIAGNOSIS — Z8349 Family history of other endocrine, nutritional and metabolic diseases: Secondary | ICD-10-CM | POA: Diagnosis not present

## 2019-09-06 DIAGNOSIS — S92413B Displaced fracture of proximal phalanx of unspecified great toe, initial encounter for open fracture: Secondary | ICD-10-CM | POA: Diagnosis present

## 2019-09-06 LAB — RESPIRATORY PANEL BY RT PCR (FLU A&B, COVID)
Influenza A by PCR: NEGATIVE
Influenza B by PCR: NEGATIVE
SARS Coronavirus 2 by RT PCR: NEGATIVE

## 2019-09-06 LAB — CBC WITH DIFFERENTIAL/PLATELET
Abs Immature Granulocytes: 0.03 10*3/uL (ref 0.00–0.07)
Basophils Absolute: 0 10*3/uL (ref 0.0–0.1)
Basophils Relative: 1 %
Eosinophils Absolute: 0.1 10*3/uL (ref 0.0–0.5)
Eosinophils Relative: 1 %
HCT: 37.7 % (ref 36.0–46.0)
Hemoglobin: 13.3 g/dL (ref 12.0–15.0)
Immature Granulocytes: 0 %
Lymphocytes Relative: 24 %
Lymphs Abs: 1.8 10*3/uL (ref 0.7–4.0)
MCH: 33.5 pg (ref 26.0–34.0)
MCHC: 35.3 g/dL (ref 30.0–36.0)
MCV: 95 fL (ref 80.0–100.0)
Monocytes Absolute: 0.5 10*3/uL (ref 0.1–1.0)
Monocytes Relative: 6 %
Neutro Abs: 5.2 10*3/uL (ref 1.7–7.7)
Neutrophils Relative %: 68 %
Platelets: 261 10*3/uL (ref 150–400)
RBC: 3.97 MIL/uL (ref 3.87–5.11)
RDW: 11.8 % (ref 11.5–15.5)
WBC: 7.6 10*3/uL (ref 4.0–10.5)
nRBC: 0 % (ref 0.0–0.2)

## 2019-09-06 LAB — BASIC METABOLIC PANEL
Anion gap: 13 (ref 5–15)
BUN: 11 mg/dL (ref 6–20)
CO2: 18 mmol/L — ABNORMAL LOW (ref 22–32)
Calcium: 9.7 mg/dL (ref 8.9–10.3)
Chloride: 107 mmol/L (ref 98–111)
Creatinine, Ser: 0.74 mg/dL (ref 0.44–1.00)
GFR calc Af Amer: >60 mL/min (ref >60)
GFR calc non Af Amer: >60 mL/min (ref >60)
Glucose, Bld: 112 mg/dL — ABNORMAL HIGH (ref 70–99)
Potassium: 3.8 mmol/L (ref 3.5–5.1)
Sodium: 138 mmol/L (ref 135–145)

## 2019-09-06 MED ORDER — METHOCARBAMOL 1000 MG/10ML IJ SOLN
500.0000 mg | Freq: Four times a day (QID) | INTRAVENOUS | Status: DC | PRN
  Filled 2019-09-06: qty 5

## 2019-09-06 MED ORDER — GABAPENTIN 100 MG PO CAPS
100.0000 mg | ORAL_CAPSULE | Freq: Three times a day (TID) | ORAL | Status: DC
  Administered 2019-09-06 – 2019-09-07 (×3): 100 mg via ORAL
  Filled 2019-09-06 (×3): qty 1

## 2019-09-06 MED ORDER — METOCLOPRAMIDE HCL 5 MG/ML IJ SOLN: 5.0000 mg | Freq: Three times a day (TID) | INTRAMUSCULAR | Status: DC | PRN

## 2019-09-06 MED ORDER — POTASSIUM CHLORIDE IN NACL 20-0.9 MEQ/L-% IV SOLN
INTRAVENOUS | Status: DC
  Administered 2019-09-06: 1000 mL via INTRAVENOUS
  Filled 2019-09-06: qty 1000

## 2019-09-06 MED ORDER — ONDANSETRON HCL 4 MG PO TABS: 4.0000 mg | ORAL_TABLET | Freq: Four times a day (QID) | ORAL | Status: DC | PRN

## 2019-09-06 MED ORDER — DOCUSATE SODIUM 100 MG PO CAPS
100.0000 mg | ORAL_CAPSULE | Freq: Two times a day (BID) | ORAL | Status: DC
  Administered 2019-09-06: 100 mg via ORAL
  Filled 2019-09-06: qty 1

## 2019-09-06 MED ORDER — CEFAZOLIN SODIUM-DEXTROSE 2-4 GM/100ML-% IV SOLN
2.0000 g | Freq: Three times a day (TID) | INTRAVENOUS | Status: DC
  Administered 2019-09-07 (×4): 2 g via INTRAVENOUS
  Filled 2019-09-06 (×4): qty 100

## 2019-09-06 MED ORDER — CEFAZOLIN SODIUM-DEXTROSE 2-4 GM/100ML-% IV SOLN
2.0000 g | Freq: Once | INTRAVENOUS | Status: AC
  Administered 2019-09-06: 2 g via INTRAVENOUS
  Filled 2019-09-06: qty 100

## 2019-09-06 MED ORDER — FENTANYL CITRATE (PF) 100 MCG/2ML IJ SOLN
50.0000 ug | INTRAMUSCULAR | Status: DC | PRN
  Administered 2019-09-06: 50 ug via INTRAVENOUS
  Filled 2019-09-06 (×2): qty 2

## 2019-09-06 MED ORDER — OXYCODONE HCL 5 MG PO TABS
5.0000 mg | ORAL_TABLET | ORAL | Status: DC | PRN
  Administered 2019-09-07 (×2): 10 mg via ORAL
  Filled 2019-09-06 (×2): qty 2

## 2019-09-06 MED ORDER — OXYCODONE-ACETAMINOPHEN 5-325 MG PO TABS: 1.0000 | ORAL_TABLET | ORAL | Status: DC | PRN

## 2019-09-06 MED ORDER — BUPROPION HCL 100 MG PO TABS
100.0000 mg | ORAL_TABLET | Freq: Every day | ORAL | Status: DC
  Administered 2019-09-07: 100 mg via ORAL
  Filled 2019-09-06: qty 1

## 2019-09-06 MED ORDER — ASPIRIN 325 MG PO TABS
325.0000 mg | ORAL_TABLET | Freq: Every day | ORAL | Status: DC
  Administered 2019-09-07: 325 mg via ORAL
  Filled 2019-09-06: qty 1

## 2019-09-06 MED ORDER — METHOCARBAMOL 500 MG PO TABS
500.0000 mg | ORAL_TABLET | Freq: Four times a day (QID) | ORAL | Status: DC | PRN
  Administered 2019-09-07 (×2): 500 mg via ORAL
  Filled 2019-09-06 (×2): qty 1

## 2019-09-06 MED ORDER — ACETAMINOPHEN 325 MG PO TABS
650.0000 mg | ORAL_TABLET | Freq: Four times a day (QID) | ORAL | Status: DC
  Administered 2019-09-06 – 2019-09-07 (×4): 650 mg via ORAL
  Filled 2019-09-06 (×4): qty 2

## 2019-09-06 MED ORDER — TETANUS-DIPHTH-ACELL PERTUSSIS 5-2.5-18.5 LF-MCG/0.5 IM SUSP
0.5000 mL | Freq: Once | INTRAMUSCULAR | Status: AC
  Administered 2019-09-06: 0.5 mL via INTRAMUSCULAR
  Filled 2019-09-06: qty 0.5

## 2019-09-06 MED ORDER — ACETAMINOPHEN 500 MG PO TABS: 500.0000 mg | ORAL_TABLET | Freq: Four times a day (QID) | ORAL | Status: DC | PRN

## 2019-09-06 MED ORDER — METOCLOPRAMIDE HCL 10 MG PO TABS: 5.0000 mg | ORAL_TABLET | Freq: Three times a day (TID) | ORAL | Status: DC | PRN

## 2019-09-06 MED ORDER — ONDANSETRON HCL 4 MG/2ML IJ SOLN
4.0000 mg | Freq: Four times a day (QID) | INTRAMUSCULAR | Status: DC | PRN
  Administered 2019-09-06: 4 mg via INTRAVENOUS
  Filled 2019-09-06: qty 2

## 2019-09-06 MED ORDER — OXYCODONE HCL 5 MG PO TABS
10.0000 mg | ORAL_TABLET | ORAL | Status: DC | PRN
  Administered 2019-09-06: 10 mg via ORAL
  Administered 2019-09-07 (×2): 15 mg via ORAL
  Filled 2019-09-06: qty 3
  Filled 2019-09-06: qty 2
  Filled 2019-09-06: qty 3

## 2019-09-06 MED ORDER — HYDROMORPHONE HCL 1 MG/ML IJ SOLN
0.5000 mg | Freq: Once | INTRAMUSCULAR | Status: AC
  Administered 2019-09-06: 0.5 mg via INTRAVENOUS
  Filled 2019-09-06: qty 1

## 2019-09-06 MED ORDER — HYDROMORPHONE HCL 1 MG/ML IJ SOLN
1.0000 mg | INTRAMUSCULAR | Status: DC | PRN
  Administered 2019-09-06 – 2019-09-07 (×3): 1 mg via INTRAVENOUS
  Filled 2019-09-06 (×3): qty 1

## 2019-09-06 NOTE — ED Triage Notes (Signed)
Pt arrives POV for eval of R foot pain w/ open lac to dorsal surface of foot. Pt erports that she dropped a piece of sheet metal on her foot and that she has been unable to walk on it. Dsg applied to large lac in triage. +DP pulse to foot, +CSM to foot.

## 2019-09-06 NOTE — ED Provider Notes (Signed)
[facility] EMERGENCY DEPARTMENT Provider Note   CSN: [id number] Arrival date & time: 09/06/19  1450     History   Chief Complaint Chief Complaint  Patient presents with  . Foot Injury    HPI Lindsey Sampson is a 38 y.o. female who presents to the emergency department for evaluation of a right foot injury.  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  Patient had immediate severe pain.  She saw bleeding took off her shoe and saw large laceration.  She is unsure of her last tetanus vaccination.  She rates her pain at 10 out of 10.  She has no previous injuries to this foot.     HPI  Past Medical History:  Diagnosis Date  . Anxiety   . Colitis   . Cyst of spleen   . Depression   . Ovarian cyst   . Renal disorder    renal cyst    Patient Active Problem List   Diagnosis Date Noted  . Nausea with vomiting 07/26/2019  . Diarrhea 07/26/2019  . Early satiety 07/26/2019  . Liver lesion 07/26/2019  . H/O tubal ligation 10/14/2018  . 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 BACK SURGERY    . CESAREAN SECTION    . CESAREAN SECTION  2006 and 2010  . cyst removed from ovary Right   . LUMBAR LAMINECTOMY/DECOMPRESSION MICRODISCECTOMY Left 05/14/2016   Procedure: LUMBAR DECOMPRESSION/MICRODISCECTOMY LEFT LUMBAR FOUR-FIVE;  Surgeon: Melina Schools, MD;  Location: [facility];  Service: Orthopedics;  Laterality: Left;  . TUBAL LIGATION       OB History    Gravida  2   Para      Term      Preterm      AB      Living  2     SAB      TAB      Ectopic      Multiple  0   Live Births  2            Home Medications    Prior to Admission medications   Medication Sig Start Date End Date Taking? Authorizing Provider  buPROPion (WELLBUTRIN) 100 MG tablet Take 100 mg by mouth daily.  02/10/19   [provider]   loperamide (IMODIUM) 2 MG capsule Take by mouth as needed for diarrhea or loose stools.    [provider]  Multiple Vitamin (MULTIVITAMIN WITH MINERALS) TABS tablet Take 1 tablet by mouth as needed.     [provider]  ondansetron (ZOFRAN) 8 MG tablet 3 (three) times daily as needed. 05/30/19   [provider]  pantoprazole (PROTONIX) 40 MG tablet Take 1 tablet (40 mg total) by mouth daily. 07/26/19   Erenest Rasher, PA-C  clonazePAM (KLONOPIN) 0.5 MG tablet Take 0.5 mg by mouth 3 (three) times daily as needed. For anxiety/sleep. 03/12/16 03/29/19  [provider]    Family History Family History  Problem Relation Age of Onset  . Diabetes Mother   . Hypertension Mother   . High Cholesterol Mother   . Hypertension Father   . Prostate cancer Father   . High Cholesterol Father   . Colon polyps Maternal Grandmother   . Colon cancer Neg Hx     Social History Social History   Tobacco  Use  . Smoking status: Light Tobacco Smoker    Packs/day: 0.50    Types: Cigarettes  . Smokeless tobacco: Never Used  . Tobacco comment: trying to quit   Substance Use Topics  . Alcohol use: Yes    Comment: rare  . Drug use: No     Allergies   Prednisone   Review of Systems Review of Systems  Ten systems reviewed and are negative for acute change, except as noted in the HPI.   Physical Exam Updated Vital Signs BP (!) 130/103 (BP Location: Left Arm)   Pulse 85   Temp 98.5 F (36.9 C) (Oral)   Resp 18   Ht 5\' 6"  (1.676 m)   Wt 77.1 kg   LMP 08/16/2019   SpO2 100%   BMI 27.44 kg/m   Physical Exam Vitals signs and nursing note reviewed.  Constitutional:      General: She is not in acute distress.    Appearance: She is well-developed. She is not diaphoretic.  HENT:     Head: Normocephalic and atraumatic.  Eyes:     General: No scleral icterus.    Conjunctiva/sclera: Conjunctivae normal.  Neck:     Musculoskeletal: Normal range of motion.   Cardiovascular:     Rate and Rhythm: Normal rate and regular rhythm.     Heart sounds: Normal heart sounds. No murmur. No friction rub. No gallop.   Pulmonary:     Effort: Pulmonary effort is normal. No respiratory distress.     Breath sounds: Normal breath sounds.  Abdominal:     General: Bowel sounds are normal. There is no distension.     Palpations: Abdomen is soft. There is no mass.     Tenderness: There is no abdominal tenderness. There is no guarding.  Musculoskeletal:     Comments: 4 cm, gaping laceration over the Right 1st MTP.  Unable to Range to toe due to severe pain. Normal DP/PT pulse.    Skin:    General: Skin is warm and dry.  Neurological:     Mental Status: She is alert and oriented to person, place, and time.  Psychiatric:        Behavior: Behavior normal.      ED Treatments / Results  Labs (all labs ordered are listed, but only abnormal results are displayed) Labs Reviewed  CBC WITH DIFFERENTIAL/PLATELET  BASIC METABOLIC PANEL    EKG None  Radiology Dg Foot Complete Right  Result Date: 09/06/2019 CLINICAL DATA:  Great toe pain after injury. EXAM: RIGHT FOOT COMPLETE - 3+ VIEW COMPARISON:  None. FINDINGS: Severely comminuted and displaced fracture is seen involving much of the first proximal phalanx. There is a tear shaped density adjacent to the second proximal phalanx which may represent either bone fragment or possible foreign body. Joint spaces are intact. IMPRESSION: 1. Severely comminuted and displaced fracture is seen involving much of the first proximal phalanx. 2. A tear shaped density seen adjacent to the second proximal phalanx which may represent either bone fragment or possible foreign body. Electronically Signed   By: Marijo Conception M.D.   On: 09/06/2019 15:26    Procedures Procedures (including critical care time)  Medications Ordered in ED Medications  fentaNYL (SUBLIMAZE) injection 50 mcg (50 mcg Intravenous Given 09/06/19 1536)   ceFAZolin (ANCEF) IVPB 2g/100 mL premix (has no administration in time range)  Tdap (BOOSTRIX) injection 0.5 mL (has no administration in time range)     Initial Impression / Assessment and Plan /  ED Course  I have reviewed the triage vital signs and the nursing notes.  Pertinent labs & imaging results that were available during my care of the patient were reviewed by me and considered in my medical decision making (see chart for details).  Clinical Course as of Sep 06 1639  Wed Sep 06, 2019  1639 DG Foot Complete Right [PS]    Clinical Course User Index [PS] Jorene Guest       38 year old female with open, comminuted metatarsal fracture of the right great toe.  She is receiving IV Ancef.  Tetanus updated.  Covid test and flu test negative.  I personally reviewed the patient's labs which show negative CBC.  Slightly elevated blood glucose likely acute phase reaction.  I personally reviewed the patient's foot x-ray which shows a comminuted displaced fracture of the first proximal phalanx.  Patient will be taken to the OR for washout and repair of open fracture by Dr. Doreatha Martin.  She is otherwise hemodynamically stable.  Final Clinical Impressions(s) / ED Diagnoses   Final diagnoses:  None    ED Discharge Orders    None       Margarita Mail, PA-C 09/07/19 0013    Lennice Sites, DO 09/07/19 [phone]

## 2019-09-06 NOTE — H&P (Signed)
Orthopaedic Trauma Service (OTS) Consult   Patient ID: Lindsey Sampson MRN: [id number] DOB/AGE: [birth year] 38 y.o.  Reason for Consult:Right open toe fracture Referring Physician: Dr. Lennice Sites, DO [facility]  HPI: Lindsey Sampson is an 38 y.o. female who is being seen at the request of Dr. Ronnald Nian for evaluation of right open toe fracture.  The patient is an otherwise healthy female who presents after a fall on her foot and went through her shoe and through her socks.  She had immediate pain and deformity with bleeding.  She was found to have a open proximal phalanx fracture.  I was consulted for evaluation and treatment.  Patient was seen at bedside in the emergency room.  She is complaining of severe pain in her foot.  She notes diminished sensation to the toe.  Denies any injuries anywhere else.  States that the pain medication improves her symptoms but she feels that she has not had enough yet.  Attempting to move it is causing significant pain.  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  She smokes approximately 2 cigarettes a day.  She lives alone but she does have a son that she has 50% partial custody.  Past Medical History:  Diagnosis Date  . Anxiety   . Colitis   . Cyst of spleen   . Depression   . Ovarian cyst   . Renal disorder    renal cyst    Past Surgical History:  Procedure Laterality Date  . BACK SURGERY    . CESAREAN SECTION    . CESAREAN SECTION  2006 and 2010  . cyst removed from ovary Right   . LUMBAR LAMINECTOMY/DECOMPRESSION MICRODISCECTOMY Left 05/14/2016   Procedure: LUMBAR DECOMPRESSION/MICRODISCECTOMY LEFT LUMBAR FOUR-FIVE;  Surgeon: Melina Schools, MD;  Location: [facility];  Service: Orthopedics;  Laterality: Left;  . TUBAL LIGATION      Family History  Problem Relation Age of Onset  . Diabetes Mother   . Hypertension Mother   . High Cholesterol Mother   . Hypertension Father   . Prostate cancer  Father   . High Cholesterol Father   . Colon polyps Maternal Grandmother   . Colon cancer Neg Hx     Social History:  reports that she has been smoking cigarettes. She has been smoking about 0.50 packs per day. She has never used smokeless tobacco. She reports current alcohol use. She reports that she does not use drugs.  Allergies:  Allergies  Allergen Reactions  . Prednisone Rash    Medications:  No current facility-administered medications on file prior to encounter.    Current Outpatient Medications on File Prior to Encounter  Medication Sig Dispense Refill  . acetaminophen (TYLENOL) 500 MG tablet Take 500 mg by mouth every 6 (six) hours as needed for mild pain.    Marland Kitchen buPROPion (WELLBUTRIN) 100 MG tablet Take 100 mg by mouth daily.     Marland Kitchen loperamide (IMODIUM) 2 MG capsule Take by mouth as needed for diarrhea or loose stools.    . [DISCONTINUED] clonazePAM (KLONOPIN) 0.5 MG tablet Take 0.5 mg by mouth 3 (three) times daily as needed. For anxiety/sleep.  1    ROS: Constitutional: No fever or chills Vision: No changes in vision ENT: No difficulty swallowing CV: No chest pain Pulm: No SOB or wheezing GI: No nausea or vomiting GU: No urgency or inability to hold urine Skin: No poor wound healing Neurologic: No  numbness or tingling Psychiatric: No depression or anxiety Heme: No bruising Allergic: No reaction to medications or food   Exam: Blood pressure (!) 130/103, pulse 85, temperature 98.5 F (36.9 C), temperature source Oral, resp. rate 18, height 5\' 6"  (1.676 m), weight 77.1 kg, last menstrual period 08/16/2019, SpO2 100 %. General: No acute distress Orientation: Awake alert and oriented x3 Mood and Affect: Cooperative and pleasant Gait: Unable to ambulate secondary to pain in her toe Coordination and balance: Within normal limits  Right lower extremity: Obvious deformity about the right great toe.  There is a 3.5 cm laceration over the MTP joint.  It does appear to  probe down to bone.  I did not explore this bedside due to the pain that she was having.  She has diminished sensation to her toe.  She has brisk cap refill of less than 2 seconds to her toe.  She has no obvious deformity at the ankle or foot otherwise.  She has no lymphadenopathy.  Reflexes are within normal limits.  Left lower extremity: Skin without lesions. No tenderness to palpation. Full painless ROM, full strength in each muscle groups without evidence of instability.   Medical Decision Making: Data: Imaging: X-rays of the right foot are reviewed which shows a comminuted and displaced and angulated proximal phalanx fracture.  It does extend intra-articularly.  No other obvious fractures.  Labs:  Results for orders placed or performed during the hospital encounter of 09/06/19 (from the past 24 hour(s))  CBC with Differential     Status: None   Collection Time: 09/06/19  5:18 PM  Result Value Ref Range   WBC 7.6 4.0 - 10.5 K/uL   RBC 3.97 3.87 - 5.11 MIL/uL   Hemoglobin 13.3 12.0 - 15.0 g/dL   HCT 37.7 36.0 - 46.0 %   MCV 95.0 80.0 - 100.0 fL   MCH 33.5 26.0 - 34.0 pg   MCHC 35.3 30.0 - 36.0 g/dL   RDW 11.8 11.5 - 15.5 %   Platelets 261 150 - 400 K/uL   nRBC 0.0 0.0 - 0.2 %   Neutrophils Relative % 68 %   Neutro Abs 5.2 1.7 - 7.7 K/uL   Lymphocytes Relative 24 %   Lymphs Abs 1.8 0.7 - 4.0 K/uL   Monocytes Relative 6 %   Monocytes Absolute 0.5 0.1 - 1.0 K/uL   Eosinophils Relative 1 %   Eosinophils Absolute 0.1 0.0 - 0.5 K/uL   Basophils Relative 1 %   Basophils Absolute 0.0 0.0 - 0.1 K/uL   Immature Granulocytes 0 %   Abs Immature Granulocytes 0.03 0.00 - 0.07 K/uL  Basic metabolic panel     Status: Abnormal   Collection Time: 09/06/19  5:18 PM  Result Value Ref Range   Sodium 138 135 - 145 mmol/L   Potassium 3.8 3.5 - 5.1 mmol/L   Chloride 107 98 - 111 mmol/L   CO2 18 (L) 22 - 32 mmol/L   Glucose, Bld 112 (H) 70 - 99 mg/dL   BUN 11 6 - 20 mg/dL   Creatinine, Ser  0.74 0.44 - 1.00 mg/dL   Calcium 9.7 8.9 - 10.3 mg/dL   GFR calc non Af Amer >60 >60 mL/min   GFR calc Af Amer >60 >60 mL/min   Anion gap 13 5 - 15    Imaging or Labs ordered: None ordered  Medical history and chart was reviewed and case discussed with medical provider.  Assessment/Plan: 38 year old female otherwise healthy with a  right open great toe proximal phalanx fracture  Patient is receiving IV antibiotics with Ancef.  We will plan to continue this for every 8 hours.  She will need a formal irrigation debridement in the operating room with possible percutaneous fixation versus open reduction internal fixation.  Risk and benefits were discussed with the patient.  She agrees to proceed with surgery.  We will place patient n.p.o. after midnight this evening for surgery first thing tomorrow morning.  The patient will likely be able to discharge postoperatively.  Shona Needles, MD [facility] [phone] (office) [email or web address]

## 2019-09-06 NOTE — ED Notes (Signed)
Called pharm for medication verification

## 2019-09-07 ENCOUNTER — Encounter (HOSPITAL_COMMUNITY): Payer: Self-pay | Admitting: Student

## 2019-09-07 ENCOUNTER — Inpatient Hospital Stay (HOSPITAL_COMMUNITY): Payer: No Typology Code available for payment source

## 2019-09-07 ENCOUNTER — Inpatient Hospital Stay (HOSPITAL_COMMUNITY): Payer: No Typology Code available for payment source | Admitting: Certified Registered"

## 2019-09-07 ENCOUNTER — Encounter (HOSPITAL_COMMUNITY)
Admission: EM | Disposition: A | Discharge status: Home / Self Care | Payer: Self-pay | Source: Home / Self Care | Attending: Student

## 2019-09-07 DIAGNOSIS — M79674 Pain in right toe(s): Secondary | ICD-10-CM | POA: Diagnosis present

## 2019-09-07 HISTORY — PX: ORIF TOE FRACTURE: SHX5032

## 2019-09-07 LAB — SURGICAL PCR SCREEN
MRSA, PCR: NEGATIVE
Staphylococcus aureus: NEGATIVE

## 2019-09-07 SURGERY — OPEN REDUCTION INTERNAL FIXATION (ORIF) METATARSAL (TOE) FRACTURE
Anesthesia: General | Site: Toe | Laterality: Right

## 2019-09-07 MED ORDER — BUPIVACAINE HCL (PF) 0.25 % IJ SOLN
INTRAMUSCULAR | Status: AC
  Filled 2019-09-07: qty 30

## 2019-09-07 MED ORDER — DEXAMETHASONE SODIUM PHOSPHATE 4 MG/ML IJ SOLN
INTRAMUSCULAR | Status: DC | PRN
  Administered 2019-09-07: 4 mg via INTRAVENOUS

## 2019-09-07 MED ORDER — ONDANSETRON HCL 4 MG/2ML IJ SOLN
INTRAMUSCULAR | Status: AC
  Filled 2019-09-07: qty 2

## 2019-09-07 MED ORDER — FENTANYL CITRATE (PF) 100 MCG/2ML IJ SOLN
INTRAMUSCULAR | Status: DC | PRN
  Administered 2019-09-07 (×5): 50 ug via INTRAVENOUS

## 2019-09-07 MED ORDER — FENTANYL CITRATE (PF) 250 MCG/5ML IJ SOLN
INTRAMUSCULAR | Status: AC
  Filled 2019-09-07: qty 5

## 2019-09-07 MED ORDER — ONDANSETRON HCL 4 MG/2ML IJ SOLN
INTRAMUSCULAR | Status: DC | PRN
  Administered 2019-09-07: 4 mg via INTRAVENOUS

## 2019-09-07 MED ORDER — HYDROMORPHONE HCL 1 MG/ML IJ SOLN
0.2500 mg | INTRAMUSCULAR | Status: DC | PRN
  Administered 2019-09-07 (×2): 0.5 mg via INTRAVENOUS

## 2019-09-07 MED ORDER — BUPIVACAINE HCL (PF) 0.25 % IJ SOLN
INTRAMUSCULAR | Status: DC | PRN
  Administered 2019-09-07: 14 mL

## 2019-09-07 MED ORDER — ASPIRIN EC 81 MG PO TBEC: 81.0000 mg | DELAYED_RELEASE_TABLET | Freq: Every day | ORAL | 0 refills | Status: AC

## 2019-09-07 MED ORDER — WHITE PETROLATUM EX OINT
TOPICAL_OINTMENT | CUTANEOUS | Status: AC
  Administered 2019-09-07: 06:00:00
  Filled 2019-09-07: qty 28.35

## 2019-09-07 MED ORDER — LACTATED RINGERS IV SOLN
INTRAVENOUS | Status: DC
  Administered 2019-09-07: 09:00:00 via INTRAVENOUS

## 2019-09-07 MED ORDER — OXYCODONE HCL 5 MG PO TABS: 5.0000 mg | ORAL_TABLET | ORAL | 0 refills | Status: DC | PRN

## 2019-09-07 MED ORDER — HYDROMORPHONE HCL 1 MG/ML IJ SOLN
INTRAMUSCULAR | Status: AC
  Administered 2019-09-07: 0.5 mg via INTRAVENOUS
  Filled 2019-09-07: qty 1

## 2019-09-07 MED ORDER — HYDROMORPHONE HCL 1 MG/ML IJ SOLN
INTRAMUSCULAR | Status: AC
  Filled 2019-09-07: qty 1

## 2019-09-07 MED ORDER — PROPOFOL 10 MG/ML IV BOLUS
INTRAVENOUS | Status: DC | PRN
  Administered 2019-09-07: 150 mg via INTRAVENOUS

## 2019-09-07 MED ORDER — DEXAMETHASONE SODIUM PHOSPHATE 10 MG/ML IJ SOLN
INTRAMUSCULAR | Status: AC
  Filled 2019-09-07: qty 1

## 2019-09-07 MED ORDER — KETOROLAC TROMETHAMINE 15 MG/ML IJ SOLN
15.0000 mg | Freq: Four times a day (QID) | INTRAMUSCULAR | Status: DC
  Administered 2019-09-07: 15 mg via INTRAVENOUS
  Filled 2019-09-07: qty 1

## 2019-09-07 MED ORDER — LIDOCAINE 2% (20 MG/ML) 5 ML SYRINGE
INTRAMUSCULAR | Status: AC
  Filled 2019-09-07: qty 5

## 2019-09-07 MED ORDER — VANCOMYCIN HCL 1000 MG IV SOLR
INTRAVENOUS | Status: AC
  Filled 2019-09-07: qty 1000

## 2019-09-07 MED ORDER — MIDAZOLAM HCL 2 MG/2ML IJ SOLN
INTRAMUSCULAR | Status: AC
  Filled 2019-09-07: qty 2

## 2019-09-07 MED ORDER — MIDAZOLAM HCL 5 MG/5ML IJ SOLN
INTRAMUSCULAR | Status: DC | PRN
  Administered 2019-09-07: 2 mg via INTRAVENOUS

## 2019-09-07 MED ORDER — VANCOMYCIN HCL 1000 MG IV SOLR
INTRAVENOUS | Status: DC | PRN
  Administered 2019-09-07: 1000 mg via TOPICAL

## 2019-09-07 MED ORDER — 0.9 % SODIUM CHLORIDE (POUR BTL) OPTIME
TOPICAL | Status: DC | PRN
  Administered 2019-09-07: 1000 mL

## 2019-09-07 MED ORDER — MUPIROCIN 2 % EX OINT: 1.0000 "application " | TOPICAL_OINTMENT | Freq: Two times a day (BID) | CUTANEOUS | Status: DC

## 2019-09-07 MED ORDER — ONDANSETRON HCL 4 MG/2ML IJ SOLN: 4.0000 mg | Freq: Once | INTRAMUSCULAR | Status: DC | PRN

## 2019-09-07 MED ORDER — GABAPENTIN 100 MG PO CAPS: 100.0000 mg | ORAL_CAPSULE | Freq: Three times a day (TID) | ORAL | 0 refills | Status: DC

## 2019-09-07 MED ORDER — LIDOCAINE 2% (20 MG/ML) 5 ML SYRINGE
INTRAMUSCULAR | Status: DC | PRN
  Administered 2019-09-07: 100 mg via INTRAVENOUS

## 2019-09-07 MED ORDER — MEPERIDINE HCL 25 MG/ML IJ SOLN: 6.2500 mg | INTRAMUSCULAR | Status: DC | PRN

## 2019-09-07 MED ORDER — PROPOFOL 10 MG/ML IV BOLUS
INTRAVENOUS | Status: AC
  Filled 2019-09-07: qty 20

## 2019-09-07 MED ORDER — VANCOMYCIN HCL 500 MG IV SOLR
INTRAVENOUS | Status: AC
  Filled 2019-09-07: qty 500

## 2019-09-07 MED ORDER — FENTANYL CITRATE (PF) 100 MCG/2ML IJ SOLN
INTRAMUSCULAR | Status: AC
  Filled 2019-09-07: qty 2

## 2019-09-07 SURGICAL SUPPLY — 65 items
0.045 K-Wire ×4 IMPLANT
APL PRP STRL LF DISP 70% ISPRP (MISCELLANEOUS) ×1
BANDAGE ESMARK 6X9 LF (GAUZE/BANDAGES/DRESSINGS) ×1 IMPLANT
BLADE SURG 10 STRL SS (BLADE) ×1 IMPLANT
BNDG CMPR 9X6 STRL LF SNTH (GAUZE/BANDAGES/DRESSINGS) ×1
BNDG COHESIVE 4X5 TAN STRL (GAUZE/BANDAGES/DRESSINGS) IMPLANT
BNDG ELASTIC 4X5.8 VLCR STR LF (GAUZE/BANDAGES/DRESSINGS) ×2 IMPLANT
BNDG ELASTIC 6X5.8 VLCR STR LF (GAUZE/BANDAGES/DRESSINGS) IMPLANT
BNDG ESMARK 6X9 LF (GAUZE/BANDAGES/DRESSINGS) ×3
BNDG GAUZE ELAST 4 BULKY (GAUZE/BANDAGES/DRESSINGS) ×3 IMPLANT
BRUSH SCRUB EZ PLAIN DRY (MISCELLANEOUS) ×6 IMPLANT
CAP PIN PROTECTOR ORTHO WHT (CAP) ×2 IMPLANT
CHLORAPREP W/TINT 26 (MISCELLANEOUS) ×3 IMPLANT
COVER SURGICAL LIGHT HANDLE (MISCELLANEOUS) ×4 IMPLANT
COVER WAND RF STERILE (DRAPES) ×1 IMPLANT
DRAPE C-ARM 42X72 X-RAY (DRAPES) ×3 IMPLANT
DRAPE C-ARMOR (DRAPES) ×3 IMPLANT
DRAPE U-SHAPE 47X51 STRL (DRAPES) ×3 IMPLANT
DRSG EMULSION OIL 3X3 NADH (GAUZE/BANDAGES/DRESSINGS) ×2 IMPLANT
ELECT REM PT RETURN 9FT ADLT (ELECTROSURGICAL) ×3
ELECTRODE REM PT RTRN 9FT ADLT (ELECTROSURGICAL) ×1 IMPLANT
GAUZE SPONGE 4X4 12PLY STRL (GAUZE/BANDAGES/DRESSINGS) ×2 IMPLANT
GAUZE SPONGE 4X4 12PLY STRL LF (GAUZE/BANDAGES/DRESSINGS) ×2 IMPLANT
GLOVE BIO SURGEON STRL SZ 6.5 (GLOVE) ×5 IMPLANT
GLOVE BIO SURGEON STRL SZ7.5 (GLOVE) ×10 IMPLANT
GLOVE BIO SURGEONS STRL SZ 6.5 (GLOVE) ×2
GLOVE BIOGEL PI IND STRL 6.5 (GLOVE) ×1 IMPLANT
GLOVE BIOGEL PI IND STRL 7.5 (GLOVE) ×1 IMPLANT
GLOVE BIOGEL PI INDICATOR 6.5 (GLOVE) ×2
GLOVE BIOGEL PI INDICATOR 7.5 (GLOVE) ×2
GOWN STRL REUS W/ TWL LRG LVL3 (GOWN DISPOSABLE) ×3 IMPLANT
GOWN STRL REUS W/TWL LRG LVL3 (GOWN DISPOSABLE) ×9
K-WIRE 9  SMOOTH .045 (WIRE) ×6 IMPLANT
KIT BASIN OR (CUSTOM PROCEDURE TRAY) ×3 IMPLANT
KIT TURNOVER KIT B (KITS) ×3 IMPLANT
MANIFOLD NEPTUNE II (INSTRUMENTS) ×1 IMPLANT
NDL HYPO 21X1.5 SAFETY (NEEDLE) IMPLANT
NEEDLE HYPO 21X1.5 SAFETY (NEEDLE) ×3 IMPLANT
NS IRRIG 1000ML POUR BTL (IV SOLUTION) ×3 IMPLANT
PACK ORTHO EXTREMITY (CUSTOM PROCEDURE TRAY) ×3 IMPLANT
PAD ARMBOARD 7.5X6 YLW CONV (MISCELLANEOUS) ×2 IMPLANT
PAD CAST 4YDX4 CTTN HI CHSV (CAST SUPPLIES) IMPLANT
PADDING CAST COTTON 4X4 STRL (CAST SUPPLIES) ×3
PADDING CAST COTTON 6X4 STRL (CAST SUPPLIES) IMPLANT
PENCIL BUTTON HOLSTER BLD 10FT (ELECTRODE) IMPLANT
SPONGE LAP 18X18 RF (DISPOSABLE) IMPLANT
STAPLER VISISTAT 35W (STAPLE) IMPLANT
SUCTION FRAZIER HANDLE 10FR (MISCELLANEOUS)
SUCTION TUBE FRAZIER 10FR DISP (MISCELLANEOUS) ×1 IMPLANT
SUT ETHILON 2 0 FS 18 (SUTURE) ×3 IMPLANT
SUT ETHILON 3 0 PS 1 (SUTURE) IMPLANT
SUT ETHILON 4 0 PS 2 18 (SUTURE) ×3 IMPLANT
SUT MNCRL AB 3-0 PS2 18 (SUTURE) ×3 IMPLANT
SUT MON AB 2-0 CT1 36 (SUTURE) IMPLANT
SUT PDS AB 2-0 CT1 27 (SUTURE) IMPLANT
SUT VIC AB 2-0 CT1 27 (SUTURE)
SUT VIC AB 2-0 CT1 TAPERPNT 27 (SUTURE) ×2 IMPLANT
SUT VIC AB 2-0 CT3 27 (SUTURE) IMPLANT
SYR CONTROL 10ML LL (SYRINGE) ×3 IMPLANT
TOWEL GREEN STERILE (TOWEL DISPOSABLE) ×2 IMPLANT
TOWEL GREEN STERILE FF (TOWEL DISPOSABLE) ×3 IMPLANT
TUBE CONNECTING 12'X1/4 (SUCTIONS) ×1
TUBE CONNECTING 12X1/4 (SUCTIONS) ×2 IMPLANT
UNDERPAD 30X30 (UNDERPADS AND DIAPERS) ×3 IMPLANT
YANKAUER SUCT BULB TIP NO VENT (SUCTIONS) ×2 IMPLANT

## 2019-09-07 NOTE — ED Notes (Signed)
ED TO INPATIENT HANDOFF REPORT  ED Nurse Name and Phone #: Gwyndolyn Saxon [phone]  S Name/Age/Gender Lindsey Sampson 38 y.o. female Room/Bed: 049C/049C  Code Status   Code Status: Full Code  Home/SNF/Other Home Patient oriented to: self, place, time and situation Is this baseline? Yes   Triage Complete: Triage complete  Chief Complaint Right foot injury   Triage Note Pt arrives POV for eval of R foot pain w/ open lac to dorsal surface of foot. Pt erports that she dropped a piece of sheet metal on her foot and that she has been unable to walk on it. Dsg applied to large lac in triage. +DP pulse to foot, +CSM to foot.   Allergies Allergies  Allergen Reactions  . Prednisone Rash    Level of Care/Admitting Diagnosis ED Disposition    ED Disposition Condition [facility] Area: [facility] [100100]  Level of Care: Med-Surg [16]  Covid Evaluation: Confirmed COVID Negative  Date Laboratory Confirmed COVID Negative: 09/06/2019  Diagnosis: Open displaced fracture of proximal phalanx of great toe [id number]  Admitting Physician: Shona Needles [id number]  Attending Physician: Shona Needles [id number]  Estimated length of stay: past midnight tomorrow  Certification:: I certify this patient will need inpatient services for at least 2 midnights  PT Class (Do Not Modify): Inpatient [101]  PT Acc Code (Do Not Modify): Private [1]       B Medical/Surgery History Past Medical History:  Diagnosis Date  . Anxiety   . Colitis   . Cyst of spleen   . Depression   . Ovarian cyst   . Renal disorder    renal cyst   Past Surgical History:  Procedure Laterality Date  . BACK SURGERY    . CESAREAN SECTION    . CESAREAN SECTION  2006 and 2010  . cyst removed from ovary Right   . LUMBAR LAMINECTOMY/DECOMPRESSION MICRODISCECTOMY Left 05/14/2016   Procedure: LUMBAR DECOMPRESSION/MICRODISCECTOMY LEFT LUMBAR FOUR-FIVE;  Surgeon: Melina Schools, MD;  Location:  [facility];  Service: Orthopedics;  Laterality: Left;  . TUBAL LIGATION       A IV Location/Drains/Wounds Patient Lines/Drains/Airways Status   Active Line/Drains/Airways    Name:   Placement date:   Placement time:   Site:   Days:   Peripheral IV 09/06/19 Right Wrist   09/06/19    1703    Wrist   1   Incision (Closed) 05/14/16 Back   05/14/16    1312     1211          Intake/Output Last 24 hours No intake or output data in the 24 hours ending 09/07/19 0004  Labs/Imaging Results for orders placed or performed during the hospital encounter of 09/06/19 (from the past 48 hour(s))  CBC with Differential     Status: None   Collection Time: 09/06/19  5:18 PM  Result Value Ref Range   WBC 7.6 4.0 - 10.5 K/uL   RBC 3.97 3.87 - 5.11 MIL/uL   Hemoglobin 13.3 12.0 - 15.0 g/dL   HCT 37.7 36.0 - 46.0 %   MCV 95.0 80.0 - 100.0 fL   MCH 33.5 26.0 - 34.0 pg   MCHC 35.3 30.0 - 36.0 g/dL   RDW 11.8 11.5 - 15.5 %   Platelets 261 150 - 400 K/uL   nRBC 0.0 0.0 - 0.2 %   Neutrophils Relative % 68 %   Neutro Abs 5.2 1.7 - 7.7 K/uL   Lymphocytes  Relative 24 %   Lymphs Abs 1.8 0.7 - 4.0 K/uL   Monocytes Relative 6 %   Monocytes Absolute 0.5 0.1 - 1.0 K/uL   Eosinophils Relative 1 %   Eosinophils Absolute 0.1 0.0 - 0.5 K/uL   Basophils Relative 1 %   Basophils Absolute 0.0 0.0 - 0.1 K/uL   Immature Granulocytes 0 %   Abs Immature Granulocytes 0.03 0.00 - 0.07 K/uL    Comment: Performed at [facility], [place] [address] [place], [place] [id number]  Basic metabolic panel     Status: Abnormal   Collection Time: 09/06/19  5:18 PM  Result Value Ref Range   Sodium 138 135 - 145 mmol/L   Potassium 3.8 3.5 - 5.1 mmol/L   Chloride 107 98 - 111 mmol/L   CO2 18 (L) 22 - 32 mmol/L   Glucose, Bld 112 (H) 70 - 99 mg/dL   BUN 11 6 - 20 mg/dL   Creatinine, Ser 0.74 0.44 - 1.00 mg/dL   Calcium 9.7 8.9 - 10.3 mg/dL   GFR calc non Af Amer >60 >60 mL/min   GFR calc Af Amer >60 >60 mL/min   Anion gap 13  5 - 15    Comment: Performed at [facility] [address] [place], [place] [address]  Respiratory Panel by RT PCR (Flu A&B, Covid) - Nasopharyngeal Swab     Status: None   Collection Time: 09/06/19  5:18 PM   Specimen: Nasopharyngeal Swab  Result Value Ref Range   SARS Coronavirus 2 by RT PCR NEGATIVE NEGATIVE    Comment: (NOTE) SARS-CoV-2 target nucleic acids are NOT DETECTED. The SARS-CoV-2 RNA is generally detectable in upper respiratoy specimens during the acute phase of infection. The lowest concentration of SARS-CoV-2 viral copies this assay can detect is 131 copies/mL. A negative result does not preclude SARS-Cov-2 infection and should not be used as the sole basis for treatment or other patient management decisions. A negative result may occur with  improper specimen collection/handling, submission of specimen other than nasopharyngeal swab, presence of viral mutation(s) within the areas targeted by this assay, and inadequate number of viral copies (<131 copies/mL). A negative result must be combined with clinical observations, patient history, and epidemiological information. The expected result is Negative. Fact Sheet for Patients:  PinkCheek.be Fact Sheet for Healthcare Providers:  GravelBags.it This test is not yet ap proved or cleared by the [place] FDA and  has been authorized for detection and/or diagnosis of SARS-CoV-2 by FDA under an Emergency Use Authorization (EUA). This EUA will remain  in effect (meaning this test can be used) for the duration of the COVID-19 declaration under Section 564(b)(1) of the Act, 21 U.S.C. section 360bbb-3(b)(1), unless the authorization is terminated or revoked sooner.    Influenza A by PCR NEGATIVE NEGATIVE   Influenza B by PCR NEGATIVE NEGATIVE    Comment: (NOTE) The Xpert Xpress SARS-CoV-2/FLU/RSV assay is intended as an aid in  the diagnosis of influenza  from Nasopharyngeal swab specimens and  should not be used as a sole basis for treatment. Nasal washings and  aspirates are unacceptable for Xpert Xpress SARS-CoV-2/FLU/RSV  testing. Fact Sheet for Patients: PinkCheek.be Fact Sheet for Healthcare Providers: GravelBags.it This test is not yet approved or cleared by the [place] FDA and  has been authorized for detection and/or diagnosis of SARS-CoV-2 by  FDA under an Emergency Use Authorization (EUA). This EUA will remain  in effect (meaning this test can  be used) for the duration of the  Covid-19 declaration under Section 564(b)(1) of the Act, 21  U.S.C. section 360bbb-3(b)(1), unless the authorization is  terminated or revoked. Performed at [facility], [place] [address], [place], [place] [address]    Dg Foot Complete Right  Result Date: 09/06/2019 CLINICAL DATA:  Great toe pain after injury. EXAM: RIGHT FOOT COMPLETE - 3+ VIEW COMPARISON:  None. FINDINGS: Severely comminuted and displaced fracture is seen involving much of the first proximal phalanx. There is a tear shaped density adjacent to the second proximal phalanx which may represent either bone fragment or possible foreign body. Joint spaces are intact. IMPRESSION: 1. Severely comminuted and displaced fracture is seen involving much of the first proximal phalanx. 2. A tear shaped density seen adjacent to the second proximal phalanx which may represent either bone fragment or possible foreign body. Electronically Signed   By: Marijo Conception M.D.   On: 09/06/2019 15:26    Pending Labs Unresulted Labs (From admission, onward)   None      Vitals/Pain Today's Vitals   09/06/19 2030 09/06/19 2030 09/06/19 2219 09/06/19 2219  BP:      Pulse:      Resp:      Temp:      TempSrc:      SpO2:      Weight:      Height:      PainSc: 10-Worst pain ever 10-Worst pain ever 10-Worst pain ever 10-Worst pain ever     Isolation Precautions No active isolations  Medications Medications  buPROPion (WELLBUTRIN) tablet 100 mg (has no administration in time range)  0.9 % NaCl with KCl 20 mEq/ L  infusion (1,000 mLs Intravenous New Bag/Given 09/06/19 2040)  methocarbamol (ROBAXIN) tablet 500 mg (has no administration in time range)    Or  methocarbamol (ROBAXIN) 500 mg in dextrose 5 % 50 mL IVPB (has no administration in time range)  docusate sodium (COLACE) capsule 100 mg (100 mg Oral Given 09/06/19 2217)  ondansetron (ZOFRAN) tablet 4 mg ( Oral See Alternative 09/06/19 2034)    Or  ondansetron (ZOFRAN) injection 4 mg (4 mg Intravenous Given 09/06/19 2034)  metoCLOPramide (REGLAN) tablet 5-10 mg (has no administration in time range)    Or  metoCLOPramide (REGLAN) injection 5-10 mg (has no administration in time range)  gabapentin (NEURONTIN) capsule 100 mg (100 mg Oral Given 09/06/19 2217)  oxyCODONE (Oxy IR/ROXICODONE) immediate release tablet 5-10 mg (has no administration in time range)  oxyCODONE (Oxy IR/ROXICODONE) immediate release tablet 10-15 mg (10 mg Oral Given 09/06/19 1901)  HYDROmorphone (DILAUDID) injection 1 mg (1 mg Intravenous Given 09/06/19 2034)  acetaminophen (TYLENOL) tablet 650 mg (650 mg Oral Given 09/06/19 1901)  aspirin tablet 325 mg (has no administration in time range)  ceFAZolin (ANCEF) IVPB 2g/100 mL premix (has no administration in time range)  ceFAZolin (ANCEF) IVPB 2g/100 mL premix (0 g Intravenous Stopped 09/06/19 1812)  Tdap (BOOSTRIX) injection 0.5 mL (0.5 mLs Intramuscular Given 09/06/19 1725)  HYDROmorphone (DILAUDID) injection 0.5 mg (0.5 mg Intravenous Given 09/06/19 1701)    Mobility walks with person assist Low fall risk   Focused Assessments Bleeding controlled on right foot open toe fx; wound wraped in Kurlex and coband   R Recommendations: See Admitting Provider Note  Report given to: Dicie Beam RN   Additional Notes:  Pt a&o x 4; pt able to hop to the  rest room with one assist; Pt NPO after midnight and  is aware;

## 2019-09-07 NOTE — Discharge Summary (Signed)
Orthopaedic Trauma Service (OTS) Discharge Summary   Patient ID: Lindsey Sampson MRN: [id number] DOB/AGE: Apr 30, 1981 38 y.o.  Admit date: 09/06/2019 Discharge date: 09/07/2019  Admission Diagnoses: Right great toe open proximal phalanx fracture   Discharge Diagnoses:  Active Problems:   Open displaced fracture of proximal phalanx of great toe   Great toe pain, right   Past Medical History:  Diagnosis Date  . Anxiety   . Colitis   . Cyst of spleen   . Depression   . Ovarian cyst   . Renal disorder    renal cyst     Procedures Performed: 1. CPT 11012-Irrigation and debridement of right great toe proximal phalanx fracture 2. CPT 28496-Percutaneous fixation of right great toe proximal phalanx fracture   Discharged Condition: stable/good  Hospital Course: Patient presented to [facility] on 09/06/2019 for evaluation of her right foot injury after a large piece of metal slammed down onto her foot while working.  Patient had immediate pain and notable large laceration to the toe.  Imaging performed in the emergency department revealed fracture of the proximal phalanx of the right great toe.  Patient was administered tetanus vaccine.  Orthopedic trauma service was consulted and patient was admitted overnight for planned surgical fixation of the fracture on 09/07/2019.  Patient was taken to the operating room by Dr. Doreatha Martin and underwent above procedure, tolerated this well without complication.  Was placed in a postop shoe with instructions to be weightbearing as tolerated through the heel of her right foot. On the afternoon of 09/07/2019, the patient was tolerating diet, working well with therapies, pain well controlled, vital signs stable, dressings clean, dry, intact and felt stable for discharge to  home. Patient will follow up as below and knows to call with questions or concerns.     Consults: None  Results for orders placed or performed during the hospital encounter of  09/06/19 (from the past 168 hour(s))  Respiratory Panel by RT PCR (Flu A&B, Covid) - Nasopharyngeal Swab   Collection Time: 09/06/19  5:18 PM   Specimen: Nasopharyngeal Swab  Result Value Ref Range   SARS Coronavirus 2 by RT PCR NEGATIVE NEGATIVE   Influenza A by PCR NEGATIVE NEGATIVE   Influenza B by PCR NEGATIVE NEGATIVE  CBC with Differential   Collection Time: 09/06/19  5:18 PM  Result Value Ref Range   WBC 7.6 4.0 - 10.5 K/uL   RBC 3.97 3.87 - 5.11 MIL/uL   Hemoglobin 13.3 12.0 - 15.0 g/dL   HCT 37.7 36.0 - 46.0 %   MCV 95.0 80.0 - 100.0 fL   MCH 33.5 26.0 - 34.0 pg   MCHC 35.3 30.0 - 36.0 g/dL   RDW 11.8 11.5 - 15.5 %   Platelets 261 150 - 400 K/uL   nRBC 0.0 0.0 - 0.2 %   Neutrophils Relative % 68 %   Neutro Abs 5.2 1.7 - 7.7 K/uL   Lymphocytes Relative 24 %   Lymphs Abs 1.8 0.7 - 4.0 K/uL   Monocytes Relative 6 %   Monocytes Absolute 0.5 0.1 - 1.0 K/uL   Eosinophils Relative 1 %   Eosinophils Absolute 0.1 0.0 - 0.5 K/uL   Basophils Relative 1 %   Basophils Absolute 0.0 0.0 - 0.1 K/uL   Immature Granulocytes 0 %   Abs Immature Granulocytes 0.03 0.00 - 0.07 K/uL  Basic metabolic panel   Collection Time: 09/06/19  5:18 PM  Result Value Ref Range   Sodium 138 135 -  145 mmol/L   Potassium 3.8 3.5 - 5.1 mmol/L   Chloride 107 98 - 111 mmol/L   CO2 18 (L) 22 - 32 mmol/L   Glucose, Bld 112 (H) 70 - 99 mg/dL   BUN 11 6 - 20 mg/dL   Creatinine, Ser 0.74 0.44 - 1.00 mg/dL   Calcium 9.7 8.9 - 10.3 mg/dL   GFR calc non Af Amer >60 >60 mL/min   GFR calc Af Amer >60 >60 mL/min   Anion gap 13 5 - 15  Surgical PCR screen   Collection Time: 09/07/19 12:38 AM   Specimen: Nasal Mucosa; Nasal Swab  Result Value Ref Range   MRSA, PCR NEGATIVE NEGATIVE   Staphylococcus aureus NEGATIVE NEGATIVE     Treatments: surgery: 1. CPT 11012-Irrigation and debridement of right great toe proximal phalanx fracture 2. CPT 28496-Percutaneous fixation of right great toe proximal phalanx  fracture   Discharge Exam: General - NAD Right lower extremity - Dressing in place over great toe and foot is clean, dry, intact. She has brisk cap refill of less than 2 seconds to her toe.  She has no obvious deformity at the ankle or foot otherwise.  Neurovascularly intact  Disposition: Discharge disposition: 01-Home or Self Care       Discharge Instructions    Call MD / Call 911   Complete by: As directed    If you experience chest pain or shortness of breath, CALL 911 and be transported to the hospital emergency room.  If you develope a fever above 101 F, pus (white drainage) or increased drainage or redness at the wound, or calf pain, call your surgeon's office.   Constipation Prevention   Complete by: As directed    Drink plenty of fluids.  Prune juice may be helpful.  You may use a stool softener, such as Colace (over the counter) 100 mg twice a day.  Use MiraLax (over the counter) for constipation as needed.   Diet - low sodium heart healthy   Complete by: As directed    Increase activity slowly as tolerated   Complete by: As directed      Allergies as of 09/07/2019      Reactions   Prednisone Rash      Medication List    TAKE these medications   acetaminophen 500 MG tablet Commonly known as: TYLENOL Take 500 mg by mouth every 6 (six) hours as needed for mild pain.   aspirin EC 81 MG tablet Take 1 tablet (81 mg total) by mouth daily.   buPROPion 100 MG tablet Commonly known as: WELLBUTRIN Take 100 mg by mouth daily.   gabapentin 100 MG capsule Commonly known as: Neurontin Take 1 capsule (100 mg total) by mouth 3 (three) times daily for 7 days.   loperamide 2 MG capsule Commonly known as: IMODIUM Take by mouth as needed for diarrhea or loose stools.   oxyCODONE 5 MG immediate release tablet Commonly known as: Roxicodone Take 1 tablet (5 mg total) by mouth every 4 (four) hours as needed for severe pain.      Follow-up Information    Haddix, Thomasene Lot,  MD. Schedule an appointment as soon as possible for a visit in 2 weeks.   Specialty: Orthopedic Surgery Why: For suture removal, For wound re-check, repeat x-rays Contact information: [address] [place] [id number] [phone]           Discharge Instructions and Plan: Patient will be discharged to home.  She will  be weightbearing as tolerated through her right heel in a postop shoe.  Will be discharged on Aspirin 81 mg daily for DVT prophylaxis. Patient will follow up with Dr. Doreatha Martin in 2 weeks for repeat x-rays and suture removal.   Signed:  Leary Roca. Carmie Kanner ?[phone] (phone) 09/07/2019, 10:19 AM  [facility] [place] [address] [phone] (F)

## 2019-09-07 NOTE — Anesthesia Preprocedure Evaluation (Signed)
Anesthesia Evaluation  Patient identified by MRN, date of birth, ID band Patient awake    Reviewed: Allergy & Precautions, NPO status , Patient's Chart, lab work & pertinent test results  Airway Mallampati: I  TM Distance: >3 FB Neck ROM: Full    Dental   Pulmonary Current Smoker,    Pulmonary exam normal        Cardiovascular Normal cardiovascular exam     Neuro/Psych Anxiety Depression    GI/Hepatic   Endo/Other    Renal/GU      Musculoskeletal   Abdominal   Peds  Hematology   Anesthesia Other Findings   Reproductive/Obstetrics                             Anesthesia Physical Anesthesia Plan  ASA: II  Anesthesia Plan: General   Post-op Pain Management:    Induction: Intravenous  PONV Risk Score and Plan: 2 and Ondansetron and Midazolam  Airway Management Planned: LMA  Additional Equipment:   Intra-op Plan:   Post-operative Plan: Extubation in OR  Informed Consent: I have reviewed the patients History and Physical, chart, labs and discussed the procedure including the risks, benefits and alternatives for the proposed anesthesia with the patient or authorized representative who has indicated his/her understanding and acceptance.       Plan Discussed with: CRNA and Surgeon  Anesthesia Plan Comments:         Anesthesia Quick Evaluation

## 2019-09-07 NOTE — Op Note (Signed)
Orthopaedic Surgery Operative Note (CSN: [id number] ) Date of Surgery: 09/07/2019  Admit Date: 09/06/2019   Diagnoses: Pre-Op Diagnoses: Right type II open great toe proximal phalanx fracture   Post-Op Diagnosis: Same  Procedures: 1. CPT 11012-Irrigation and debridement of right great toe proximal phalanx fracture 2. CPT 28496-Percutaneous fixation of right great toe proximal phalanx fracture  Surgeons : Primary: Asberry Lascola, Thomasene Lot, MD  Assistant: Patrecia Pace, PA-C  Location: OR 7   Anesthesia:General  Antibiotics: Ancef 2g preop, 1 gm Vancomycin powder topically   Tourniquet time:None  Estimated Blood Loss:Minimal  Complications:None  Specimens:None   Implants: 0.045 inch K-wires x2   Indications for Surgery: 38 year old female who had a large amount of sheet-metal landed on her toe.  She had immediate pain deformity and open fracture.  She was brought to the emergency room where x-rays show a comminuted right proximal phalanx fracture.  I recommended proceeding to the operating room for irrigation debridement with percutaneous versus open reduction internal fixation of the right great toe.  Risks and benefits were discussed with the patient.  Risks included but not limited to bleeding, infection, malunion, nonunion, hardware failure, hardware irritation, need for further surgery, nerve or blood vessel injury, possibility amputation, even the possibility anesthetic complications.  Patient agreed to proceed with surgery and consent was obtained.  Operative Findings: 1.  Type II open right great toe proximal phalanx fracture treated with irrigation debridement. 2.  Percutaneous fixation of right great toe proximal phalanx fracture using 0.045 K wires x2  Procedure: The patient was identified in the preoperative holding area. Consent was confirmed with the patient and their family and all questions were answered. The operative extremity was marked after confirmation with the  patient. she was then brought back to the operating room by our anesthesia colleagues.  She was carefully transferred over to a radiolucent flat top table.  She was placed under general anesthetic. The operative extremity was then prepped and draped in usual sterile fashion. A preoperative timeout was performed to verify the patient, the procedure, and the extremity. Preoperative antibiotics were dosed.  I first started out by infiltrating the wound and toe area with approximately 15 mL of quarter percent Marcaine to provide local anesthetic and provide postoperative pain control.  Fluoroscopic imaging showed the comminuted proximal phalanx fracture of the great toe with significant instability.  I performed excisional debridement of skin subcutaneous tissue and bone using a 15 blade as well as a ronguer.  The 3.5 cm laceration on dorsal aspect of the MTP joint probed directly to the fracture site.  I was able use approximately 1 L of normal saline to irrigate the wound.  There was no contamination.  I then changed instruments and gloves and turned my attention to fixation of the toe.  Using a 0.045 inch K wire I directed it from the medial aspect of the distal portion of the phalanx crossing the fracture site and purchasing the proximal lateral portion of the base of the phalanx.  The process was repeated from the distal lateral side to the proximal medial side creating a cross fixation of the K wires.  I confirmed adequate placement of the K wires using fluoroscopy.  Reduction was acceptable.  The toe was relatively stable after fixation with the K wires.  The pins were then cut and bent.  A gram of vancomycin powder was placed into the wound.  A layer closure of 3-0 Monocryl in 4-0 nylon was used to close the incision.  A sterile dressing was placed.  The patient was awoken from anesthesia and taken to the PACU in stable condition.  Post Op Plan/Instructions: The patient will be weightbearing as  tolerated through her right heel with a postop shoe.  She has received prophylactic antibiotics I feel that she would be appropriate for discharge today.  She will be placed on aspirin 81 mg for DVT prophylaxis.  We will plan to have her return in 1 to 2 weeks for suture removal and x-rays.  I was present and performed the entire surgery.  Patrecia Pace, PA-C did assist me throughout the case. An assistant was necessary given the difficulty in approach, maintenance of reduction and ability to instrument the fracture.   Katha Hamming, MD [facility]

## 2019-09-07 NOTE — Anesthesia Postprocedure Evaluation (Signed)
Anesthesia Post Note  Patient: Lindsey Sampson  Procedure(s) Performed: IRRIGATION AND DEBRIDEMENT AND OPEN REDUCTION INTERNAL FIXATION (ORIF) Proximal phalanx FRACTURE (Right Toe)     Patient location during evaluation: PACU Anesthesia Type: General Level of consciousness: awake and alert Pain management: pain level controlled Vital Signs Assessment: post-procedure vital signs reviewed and stable Respiratory status: spontaneous breathing, nonlabored ventilation, respiratory function stable and patient connected to nasal cannula oxygen Cardiovascular status: blood pressure returned to baseline and stable Postop Assessment: no apparent nausea or vomiting Anesthetic complications: no    Last Vitals:  Vitals:   09/07/19 1030 09/07/19 1100  BP: 110/75 116/67  Pulse: 69 70  Resp: 18 17  Temp: (!) 36.1 C (!) 36.1 C  SpO2: 100% 100%    Last Pain:  Vitals:   09/07/19 1025  TempSrc:   PainSc: [place] DAVID

## 2019-09-07 NOTE — Interval H&P Note (Signed)
History and Physical Interval Note:  09/07/2019 8:31 AM  Lindsey Sampson  has presented today for surgery, with the diagnosis of Right open proximal phalanx fracture.  The various methods of treatment have been discussed with the patient and family. After consideration of risks, benefits and other options for treatment, the patient has consented to  Procedure(s): IRRIGATION AND DEBRIDEMENT AND OPEN REDUCTION INTERNAL FIXATION (ORIF) Proximal phalanx FRACTURE (Right) as a surgical intervention.  The patient's history has been reviewed, patient examined, no change in status, stable for surgery.  I have reviewed the patient's chart and labs.  Questions were answered to the patient's satisfaction.     Lennette Bihari P Mima [place]

## 2019-09-07 NOTE — Discharge Instructions (Signed)
Orthopaedic Trauma Service Discharge Instructions   General Discharge Instructions  WEIGHT BEARING STATUS: Weightbearing as tolerated through right heel in post-op shoe  RANGE OF MOTION/ACTIVITY: Okay for ankle range of motion  Wound Care: Remove surgical dressing on Saturday 09/07/2019. Leave pins in place.  Incisions can be left open to air if there is no drainage. If incision continues to have drainage, follow wound care instructions below. Okay to shower if no drainage from incisions.  DVT/PE prophylaxis: Aspirin 81 mg daily x 30 days  Diet: as you were eating previously.  Can use over the counter stool softeners and bowel preparations, such as Miralax, to help with bowel movements.  Narcotics can be constipating.  Be sure to drink plenty of fluids  PAIN MEDICATION USE AND EXPECTATIONS  You have likely been given narcotic medications to help control your pain.  After a traumatic event that results in an fracture (broken bone) with or without surgery, it is ok to use narcotic pain medications to help control one's pain.  We understand that everyone responds to pain differently and each individual patient will be evaluated on a regular basis for the continued need for narcotic medications. Ideally, narcotic medication use should last no more than 6-8 weeks (coinciding with fracture healing).   As a patient it is your responsibility as well to monitor narcotic medication use and report the amount and frequency you use these medications when you come to your office visit.   We would also advise that if you are using narcotic medications, you should take a dose prior to therapy to maximize you participation.  IF YOU ARE ON NARCOTIC MEDICATIONS IT IS NOT PERMISSIBLE TO OPERATE A MOTOR VEHICLE (MOTORCYCLE/CAR/TRUCK/MOPED) OR HEAVY MACHINERY DO NOT MIX NARCOTICS WITH OTHER CNS (CENTRAL NERVOUS SYSTEM) DEPRESSANTS SUCH AS ALCOHOL   STOP SMOKING OR USING NICOTINE PRODUCTS!!!!  As discussed  nicotine severely impairs your body's ability to heal surgical and traumatic wounds but also impairs bone healing.  Wounds and bone heal by forming microscopic blood vessels (angiogenesis) and nicotine is a vasoconstrictor (essentially, shrinks blood vessels).  Therefore, if vasoconstriction occurs to these microscopic blood vessels they essentially disappear and are unable to deliver necessary nutrients to the healing tissue.  This is one modifiable factor that you can do to dramatically increase your chances of healing your injury.    (This means no smoking, no nicotine gum, patches, etc)  DO NOT USE NONSTEROIDAL ANTI-INFLAMMATORY DRUGS (NSAID'S)  Using products such as Advil (ibuprofen), Aleve (naproxen), Motrin (ibuprofen) for additional pain control during fracture healing can delay and/or prevent the healing response.  If you would like to take over the counter (OTC) medication, Tylenol (acetaminophen) is ok.  However, some narcotic medications that are given for pain control contain acetaminophen as well. Therefore, you should not exceed more than 4000 mg of tylenol in a day if you do not have liver disease.  Also note that there are may OTC medicines, such as cold medicines and allergy medicines that my contain tylenol as well.  If you have any questions about medications and/or interactions please ask your doctor/PA or your pharmacist.      ICE AND ELEVATE INJURED/OPERATIVE EXTREMITY  Using ice and elevating the injured extremity above your heart can help with swelling and pain control.  Icing in a pulsatile fashion, such as 20 minutes on and 20 minutes off, can be followed.    Do not place ice directly on skin. Make sure there is a barrier  between to skin and the ice pack.    Using frozen items such as frozen peas works well as the conform nicely to the are that needs to be iced.  USE AN ACE WRAP OR TED HOSE FOR SWELLING CONTROL  In addition to icing and elevation, Ace wraps or TED hose are  used to help limit and resolve swelling.  It is recommended to use Ace wraps or TED hose until you are informed to stop.    When using Ace Wraps start the wrapping distally (farthest away from the body) and wrap proximally (closer to the body)   Example: If you had surgery on your leg or thing and you do not have a splint on, start the ace wrap at the toes and work your way up to the thigh        If you had surgery on your upper extremity and do not have a splint on, start the ace wrap at your fingers and work your way up to the upper arm  IF YOU ARE IN A SPLINT OR CAST DO NOT [place]   If your splint gets wet for any reason please contact the office immediately. You may shower in your splint or cast as long as you keep it dry.  This can be done by wrapping in a cast cover or garbage back (or similar)  Do Not stick any thing down your splint or cast such as pencils, money, or hangers to try and scratch yourself with.  If you feel itchy take benadryl as prescribed on the bottle for itching  IF YOU ARE IN A CAM BOOT (BLACK BOOT)  You may remove boot periodically. Perform daily dressing changes as noted below.  Wash the liner of the boot regularly and wear a sock when wearing the boot. It is recommended that you sleep in the boot until told otherwise   CALL THE OFFICE WITH ANY QUESTIONS OR CONCERNS: [phone]   VISIT OUR WEBSITE FOR ADDITIONAL INFORMATION: [email or web address]      Discharge Pin Site Instructions  Dress pins daily with Kerlix roll starting on POD 2. Wrap the Kerlix so that it tamps the skin down around the pin-skin interface to prevent/limit motion of the skin relative to the pin.  (Pin-skin motion is the primary cause of pain and infection related to external fixator pin sites).  Remove any crust or coagulum that may obstruct drainage with a saline moistened gauze or soap and water.  After POD 3, if there is no discernable drainage on the pin site dressing,  the interval for change can by increased to every other day.  You may shower with the fixator, cleaning all pin sites gently with soap and water.  If you have a surgical wound this needs to be completely dry and without drainage before showering.  The extremity can be lifted by the fixator to facilitate wound care and transfers.  Notify the office/Doctor if you experience increasing drainage, redness, or pain from a pin site, or if you notice purulent (thick, snot-like) drainage.  Discharge Wound Care Instructions  Do NOT apply any ointments, solutions or lotions to pin sites or surgical wounds.  These prevent needed drainage and even though solutions like hydrogen peroxide kill bacteria, they also damage cells lining the pin sites that help fight infection.  Applying lotions or ointments can keep the wounds moist and can cause them to breakdown and open up as well. This can increase the risk for infection.  When in doubt call the office.  Surgical incisions should be dressed daily.  If any drainage is noted, use one layer of adaptic, then gauze, Kerlix, and an ace wrap.  Once the incision is completely dry and without drainage, it may be left open to air out.  Showering may begin 36-48 hours later.  Cleaning gently with soap and water.  Traumatic wounds should be dressed daily as well.    One layer of adaptic, gauze, Kerlix, then ace wrap.  The adaptic can be discontinued once the draining has ceased    If you have a wet to dry dressing: wet the gauze with saline the squeeze as much saline out so the gauze is moist (not soaking wet), place moistened gauze over wound, then place a dry gauze over the moist one, followed by Kerlix wrap, then ace wrap.

## 2019-09-07 NOTE — Progress Notes (Addendum)
0805 Pt to Short stay via bed. Pt is A&O x4, NPO post mn maint. Right foot dressing dry and intact. Report was given to short stay. 1100 Received pt from PACU, A&O x4, c/o right foot pain. Right foot dressing dry and intact. 1740 Discharge instructions given to pt, verbalized understanding.  Discharged to home accompanied by her father.

## 2019-09-07 NOTE — Progress Notes (Signed)
Urine Preg had not been completed. Spoke with Dr. Conrad Leakey. He is ok with not obtaining Urine preg d/t pt having tubal ligation.

## 2019-09-07 NOTE — Anesthesia Procedure Notes (Signed)
Procedure Name: LMA Insertion Date/Time: 09/07/2019 8:54 AM Performed by: Amadeo Garnet, CRNA Pre-anesthesia Checklist: Emergency Drugs available, Patient identified, Suction available and Patient being monitored Patient Re-evaluated:Patient Re-evaluated prior to induction Oxygen Delivery Method: Circle system utilized Preoxygenation: Pre-oxygenation with 100% oxygen Induction Type: IV induction Ventilation: Mask ventilation without difficulty LMA: LMA inserted LMA Size: 4.0 Number of attempts: 1 Placement Confirmation: positive ETCO2 and breath sounds checked- equal and bilateral Tube secured with: Tape Dental Injury: Teeth and Oropharynx as per pre-operative assessment

## 2019-09-07 NOTE — Plan of Care (Signed)
  Problem: Education: Goal: Ability to state activities that reduce stress will improve Outcome: Progressing   Problem: Coping: Goal: Ability to identify and develop effective coping behavior will improve Outcome: Progressing   Problem: Self-Concept: Goal: Ability to identify factors that promote anxiety will improve Outcome: Progressing

## 2019-09-07 NOTE — Transfer of Care (Signed)
Immediate Anesthesia Transfer of Care Note  Patient: Lindsey Sampson  Procedure(s) Performed: IRRIGATION AND DEBRIDEMENT AND OPEN REDUCTION INTERNAL FIXATION (ORIF) Proximal phalanx FRACTURE (Right Toe)  Patient Location: PACU  Anesthesia Type:General  Level of Consciousness: awake, alert  and oriented  Airway & Oxygen Therapy: Patient Spontanous Breathing  Post-op Assessment: Report given to RN, Post -op Vital signs reviewed and stable and Patient moving all extremities  Post vital signs: Reviewed and stable  Last Vitals:  Vitals Value Taken Time  BP 139/85 09/07/19 0945  Temp 36.1 C 09/07/19 0945  Pulse 91 09/07/19 0948  Resp 12 09/07/19 0948  SpO2 95 % 09/07/19 0948  Vitals shown include unvalidated device data.  Last Pain:  Vitals:   09/07/19 0537  TempSrc:   PainSc: 10-Worst pain ever         Complications: No apparent anesthesia complications

## 2019-09-11 ENCOUNTER — Encounter: Payer: Self-pay | Admitting: *Deleted

## 2019-11-08 ENCOUNTER — Other Ambulatory Visit: Payer: Self-pay

## 2019-11-08 ENCOUNTER — Other Ambulatory Visit (HOSPITAL_COMMUNITY)
Admission: RE | Admit: 2019-11-08 | Discharge: 2019-11-08 | Disposition: A | Discharge status: Home / Self Care | Payer: 59 | Source: Ambulatory Visit | Attending: Gastroenterology | Admitting: Gastroenterology

## 2019-11-08 DIAGNOSIS — Z20822 Contact with and (suspected) exposure to covid-19: Secondary | ICD-10-CM | POA: Diagnosis not present

## 2019-11-08 DIAGNOSIS — Z01812 Encounter for preprocedural laboratory examination: Secondary | ICD-10-CM | POA: Diagnosis present

## 2019-11-08 LAB — SARS CORONAVIRUS 2 (TAT 6-24 HRS): SARS Coronavirus 2: NEGATIVE

## 2019-11-10 ENCOUNTER — Ambulatory Visit (HOSPITAL_COMMUNITY)
Admission: RE | Admit: 2019-11-10 | Discharge: 2019-11-10 | Disposition: A | Discharge status: Home / Self Care | Payer: 59 | Attending: Gastroenterology | Admitting: Gastroenterology

## 2019-11-10 ENCOUNTER — Encounter (HOSPITAL_COMMUNITY): Payer: Self-pay | Admitting: Gastroenterology

## 2019-11-10 ENCOUNTER — Encounter (HOSPITAL_COMMUNITY)
Admission: RE | Disposition: A | Discharge status: Home / Self Care | Payer: Self-pay | Source: Home / Self Care | Attending: Gastroenterology

## 2019-11-10 ENCOUNTER — Other Ambulatory Visit: Payer: Self-pay

## 2019-11-10 DIAGNOSIS — F17210 Nicotine dependence, cigarettes, uncomplicated: Secondary | ICD-10-CM | POA: Insufficient documentation

## 2019-11-10 DIAGNOSIS — R1013 Epigastric pain: Secondary | ICD-10-CM | POA: Diagnosis present

## 2019-11-10 DIAGNOSIS — R6881 Early satiety: Secondary | ICD-10-CM

## 2019-11-10 DIAGNOSIS — K2980 Duodenitis without bleeding: Secondary | ICD-10-CM | POA: Insufficient documentation

## 2019-11-10 DIAGNOSIS — K319 Disease of stomach and duodenum, unspecified: Secondary | ICD-10-CM | POA: Diagnosis not present

## 2019-11-10 DIAGNOSIS — K2960 Other gastritis without bleeding: Secondary | ICD-10-CM | POA: Insufficient documentation

## 2019-11-10 DIAGNOSIS — K2970 Gastritis, unspecified, without bleeding: Secondary | ICD-10-CM

## 2019-11-10 DIAGNOSIS — R112 Nausea with vomiting, unspecified: Secondary | ICD-10-CM

## 2019-11-10 DIAGNOSIS — K259 Gastric ulcer, unspecified as acute or chronic, without hemorrhage or perforation: Secondary | ICD-10-CM | POA: Diagnosis not present

## 2019-11-10 DIAGNOSIS — F329 Major depressive disorder, single episode, unspecified: Secondary | ICD-10-CM | POA: Diagnosis not present

## 2019-11-10 HISTORY — PX: BIOPSY: SHX5522

## 2019-11-10 HISTORY — PX: ESOPHAGOGASTRODUODENOSCOPY: SHX5428

## 2019-11-10 SURGERY — EGD (ESOPHAGOGASTRODUODENOSCOPY)
Anesthesia: Moderate Sedation

## 2019-11-10 MED ORDER — MEPERIDINE HCL 100 MG/ML IJ SOLN
INTRAMUSCULAR | Status: AC
  Filled 2019-11-10: qty 2

## 2019-11-10 MED ORDER — PROMETHAZINE HCL 25 MG/ML IJ SOLN
INTRAMUSCULAR | Status: DC | PRN
  Administered 2019-11-10: 12.5 mg via INTRAVENOUS

## 2019-11-10 MED ORDER — PROMETHAZINE HCL 25 MG/ML IJ SOLN
INTRAMUSCULAR | Status: AC
  Filled 2019-11-10: qty 1

## 2019-11-10 MED ORDER — LIDOCAINE VISCOUS HCL 2 % MT SOLN
OROMUCOSAL | Status: DC | PRN
  Administered 2019-11-10: 1 via OROMUCOSAL

## 2019-11-10 MED ORDER — MIDAZOLAM HCL 5 MG/5ML IJ SOLN
INTRAMUSCULAR | Status: AC
  Filled 2019-11-10: qty 10

## 2019-11-10 MED ORDER — SODIUM CHLORIDE FLUSH 0.9 % IV SOLN
INTRAVENOUS | Status: AC
  Filled 2019-11-10: qty 10

## 2019-11-10 MED ORDER — LIDOCAINE VISCOUS HCL 2 % MT SOLN
OROMUCOSAL | Status: AC
  Filled 2019-11-10: qty 15

## 2019-11-10 MED ORDER — PANTOPRAZOLE SODIUM 40 MG PO TBEC: DELAYED_RELEASE_TABLET | ORAL | 11 refills | Status: DC

## 2019-11-10 MED ORDER — MEPERIDINE HCL 100 MG/ML IJ SOLN
INTRAMUSCULAR | Status: DC | PRN
  Administered 2019-11-10 (×3): 25 mg via INTRAVENOUS

## 2019-11-10 MED ORDER — MIDAZOLAM HCL 5 MG/5ML IJ SOLN
INTRAMUSCULAR | Status: DC | PRN
  Administered 2019-11-10 (×4): 2 mg via INTRAVENOUS

## 2019-11-10 MED ORDER — SODIUM CHLORIDE 0.9 % IV SOLN
INTRAVENOUS | Status: DC
  Administered 2019-11-10: 11:00:00 1000 mL via INTRAVENOUS

## 2019-11-10 MED ORDER — STERILE WATER FOR IRRIGATION IR SOLN
Status: DC | PRN
  Administered 2019-11-10: 1.5 mL

## 2019-11-10 NOTE — Discharge Instructions (Signed)
You have EROSIVE gastritis AND DUODENITIS due to aspirin PRODUCTS.I biopsied your stomach.   DRINK WATER TO KEEP YOUR URINE LIGHT YELLOW.  FOLLOW A LOW FAT DIET. AVOID FAST FOOD. MEATS SHOULD BE BAKED, BROILED, OR BOILED. AVOID FRIED FOODS. SEE INFO BELOW.   CONTINUE PROTONIX. TAKE 30 MINUTES PRIOR TO MEALS TWICE DAILY FOR ONE MONTH THEN ONCE DAILY FOR 2 MOS. AFTER 3 MOS, USE PROTONIX ONLY WHEN YOU TAKE ASPIRIN PRODUCTS.   YOUR BIOPSY RESULTS WILL BE BACK IN 5 BUSINESS DAYS.  FOLLOW UP IN 4 MOS.  UPPER ENDOSCOPY AFTER CARE Read the instructions outlined below and refer to this sheet in the next week. These discharge instructions provide you with general information on caring for yourself after you leave the hospital. While your treatment has been planned according to the most current medical practices available, unavoidable complications occasionally occur. If you have any problems or questions after discharge, call DR. Steph Cheadle, [phone].  ACTIVITY  You may resume your regular activity, but move at a slower pace for the next 24 hours.   Take frequent rest periods for the next 24 hours.   Walking will help get rid of the air and reduce the bloated feeling in your belly (abdomen).   No driving for 24 hours (because of the medicine (anesthesia) used during the test).   You may shower.   Do not sign any important legal documents or operate any machinery for 24 hours (because of the anesthesia used during the test).    NUTRITION  Drink plenty of fluids.   You may resume your normal diet as instructed by your doctor.   Begin with a light meal and progress to your normal diet. Heavy or fried foods are harder to digest and may make you feel sick to your stomach (nauseated).   Avoid alcoholic beverages for 24 hours or as instructed.    MEDICATIONS  You may resume your normal medications.   WHAT YOU CAN EXPECT TODAY  Some feelings of bloating in the abdomen.   Passage  of more gas than usual.    IF YOU HAD A BIOPSY TAKEN DURING THE UPPER ENDOSCOPY:  Eat a soft diet IF YOU HAVE NAUSEA, BLOATING, ABDOMINAL PAIN, OR VOMITING.    FINDING OUT THE RESULTS OF YOUR TEST Not all test results are available during your visit. DR. Oneida Alar WILL CALL YOU WITHIN 14 DAYS OF YOUR PROCEDUE WITH YOUR RESULTS. Do not assume everything is normal if you have not heard from DR. Elenna Spratling, CALL HER OFFICE AT [phone].  SEEK IMMEDIATE MEDICAL ATTENTION AND CALL THE OFFICE: [phone] IF:  You have more than a spotting of blood in your stool.   Your belly is swollen (abdominal distention).   You are nauseated or vomiting.   You have a temperature over 101F.   You have abdominal pain or discomfort that is severe or gets worse throughout the day.   Gastritis  Gastritis is an inflammation (the body's way of reacting to injury and/or infection) of the stomach. It is often caused by viral or bacterial (germ) infections. It can also be caused BY ASPIRIN, BC/GOODY POWDER'S, (IBUPROFEN) MOTRIN, OR ALEVE (NAPROXEN), chemicals (including alcohol), SPICY FOODS, and medications. This illness may be associated with generalized malaise (feeling tired, not well), UPPER ABDOMINAL STOMACH cramps, and fever. One common bacterial cause of gastritis is an organism known as H. Pylori. This can be treated with antibiotics.

## 2019-11-10 NOTE — H&P (Addendum)
Primary Care Physician:  [name], MD Primary Gastroenterologist:  Dr. Oneida Alar  Pre-Procedure History & Physical: HPI:  Lindsey Sampson is a 39 y.o. female here for DYSPEPSIA. LMP: FRI FEB 5.  Past Medical History:  Diagnosis Date  . Anxiety   . Colitis   . Cyst of spleen   . Depression   . Ovarian cyst   . Renal disorder    renal cyst    Past Surgical History:  Procedure Laterality Date  . BACK SURGERY    . CESAREAN SECTION    . CESAREAN SECTION  2006 and 2010  . cyst removed from ovary Right   . LUMBAR LAMINECTOMY/DECOMPRESSION MICRODISCECTOMY Left 05/14/2016   Procedure: LUMBAR DECOMPRESSION/MICRODISCECTOMY LEFT LUMBAR FOUR-FIVE;  Surgeon: Melina Schools, MD;  Location: [place];  Service: Orthopedics;  Laterality: Left;  . ORIF TOE FRACTURE Right 09/07/2019   Procedure: IRRIGATION AND DEBRIDEMENT AND OPEN REDUCTION INTERNAL FIXATION (ORIF) Proximal phalanx FRACTURE;  Surgeon: Shona Needles, MD;  Location: [facility];  Service: Orthopedics;  Laterality: Right;  . TUBAL LIGATION      Prior to Admission medications   Medication Sig Start Date End Date Taking? Authorizing Provider  acetaminophen (TYLENOL) 500 MG tablet Take 500 mg by mouth every 6 (six) hours as needed for mild pain.   Yes [provider]  buPROPion (ZYBAN) 150 MG 12 hr tablet Take 150 mg by mouth 2 (two) times daily. 10/03/19  Yes [provider]  loperamide (IMODIUM) 2 MG capsule Take 2-4 mg by mouth 4 (four) times daily as needed for diarrhea or loose stools.    Yes [provider]  ondansetron (ZOFRAN) 8 MG tablet Take 8 mg by mouth 3 (three) times daily as needed for nausea/vomiting. 09/08/19  Yes [provider]  gabapentin (NEURONTIN) 100 MG capsule Take 1 capsule (100 mg total) by mouth 3 (three) times daily for 7 days. Patient not taking: Reported on 11/03/2019 09/07/19 09/14/19  Delray Alt, PA-C  oxyCODONE (ROXICODONE) 5 MG immediate release tablet Take 1 tablet (5  mg total) by mouth every 4 (four) hours as needed for severe pain. Patient not taking: Reported on 11/03/2019 09/07/19   Delray Alt, PA-C  clonazePAM (KLONOPIN) 0.5 MG tablet Take 0.5 mg by mouth 3 (three) times daily as needed. For anxiety/sleep. 03/12/16 03/29/19  [provider]    Allergies as of 07/26/2019 - Review Complete 07/26/2019  Allergen Reaction Noted  . Prednisone Rash 02/22/2016    Family History  Problem Relation Age of Onset  . Diabetes Mother   . Hypertension Mother   . High Cholesterol Mother   . Hypertension Father   . Prostate cancer Father   . High Cholesterol Father   . Colon polyps Maternal Grandmother   . Colon cancer Neg Hx     Social History   Socioeconomic History  . Marital status: Single    Spouse name: Not on file  . Number of children: Not on file  . Years of education: Not on file  . Highest education level: Not on file  Occupational History  . Not on file  Tobacco Use  . Smoking status: Light Tobacco Smoker    Packs/day: 0.50    Types: Cigarettes  . Smokeless tobacco: Never Used  . Tobacco comment: trying to quit   Substance and Sexual Activity  . Alcohol use: Yes    Comment: rare  . Drug use: No  . Sexual activity: Yes    Partners: Male  Birth control/protection: Surgical  Other Topics Concern  . Not on file  Social History Narrative  . Not on file   Social Determinants of Health   Financial Resource Strain:   . Difficulty of Paying Living Expenses: Not on file  Food Insecurity:   . Worried About Charity fundraiser in the Last Year: Not on file  . Ran Out of Food in the Last Year: Not on file  Transportation Needs:   . Lack of Transportation (Medical): Not on file  . Lack of Transportation (Non-Medical): Not on file  Physical Activity:   . Days of Exercise per Week: Not on file  . Minutes of Exercise per Session: Not on file  Stress:   . Feeling of Stress : Not on file  Social Connections:   . Frequency  of Communication with Friends and Family: Not on file  . Frequency of Social Gatherings with Friends and Family: Not on file  . Attends Religious Services: Not on file  . Active Member of Clubs or Organizations: Not on file  . Attends Archivist Meetings: Not on file  . Marital Status: Not on file  Intimate Partner Violence:   . Fear of Current or Ex-Partner: Not on file  . Emotionally Abused: Not on file  . Physically Abused: Not on file  . Sexually Abused: Not on file    Review of Systems: See HPI, otherwise negative ROS   Physical Exam: BP 113/68   Pulse 68   Temp 97.8 F (36.6 C) (Oral)   Resp 12   Ht 5\' 6"  (1.676 m)   Wt 76.7 kg   LMP 11/03/2019 (Exact Date)   SpO2 100%   BMI 27.28 kg/m  General:   Alert,  pleasant and cooperative in NAD Head:  Normocephalic and atraumatic. Neck:  Supple; Lungs:  Clear throughout to auscultation.    Heart:  Regular rate and rhythm. Abdomen:  Soft, nontender and nondistended. Normal bowel sounds, without guarding, and without rebound.   Neurologic:  Alert and  oriented x4;  grossly normal neurologically.  Impression/Plan:     DYSPEPSIA  PLAN:  EGD TODAY.  DISCUSSED PROCEDURE, BENEFITS, & RISKS: < 1% chance of medication reaction, bleeding, perforation, or ASPIRATION.

## 2019-11-10 NOTE — Op Note (Signed)
[facility] Patient Name: Lindsey Sampson Procedure Date: 11/10/2019 11:28 AM MRN: [id number] Date of Birth: 1980-11-28 Attending MD: Barney Drain MD, MD CSN: [id number] Age: 39 Admit Type: Outpatient Procedure:                Upper GI endoscopy WITH COLD FORCEPS BIOPSY Indications:              Epigastric abdominal pain, Early satiety, Nausea                            with vomiting Providers:                Barney Drain MD, MD, Charlsie Quest. Theda Sers RN, RN,                            Nelma Rothman, Technician Referring MD:             [name] Medicines:                Promethazine 12.5 mg IV, Meperidine 75 mg IV,                            Midazolam 8 mg IV Complications:            No immediate complications. Estimated Blood Loss:     Estimated blood loss was minimal. Procedure:                Pre-Anesthesia Assessment:                           - Prior to the procedure, a History and Physical                            was performed, and patient medications and                            allergies were reviewed. The patient's tolerance of                            previous anesthesia was also reviewed. The risks                            and benefits of the procedure and the sedation                            options and risks were discussed with the patient.                            All questions were answered, and informed consent                            was obtained. Prior Anticoagulants: The patient has                            taken no previous anticoagulant or antiplatelet  agents except for aspirin. ASA Grade Assessment: II                            - A patient with mild systemic disease. After                            reviewing the risks and benefits, the patient was                            deemed in satisfactory condition to undergo the                            procedure. After obtaining informed consent, the                             endoscope was passed under direct vision.                            Throughout the procedure, the patient's blood                            pressure, pulse, and oxygen saturations were                            monitored continuously. The [id number]                            scope was introduced through the mouth, and                            advanced to the duodenal bulb. The upper GI                            endoscopy was accomplished without difficulty. The                            patient tolerated the procedure well. Scope In: 11:53:44 AM Scope Out: 12:00:04 PM Total Procedure Duration: 0 hours 6 minutes 20 seconds  Findings:      The examined esophagus was normal.      Segmental moderate inflammation characterized by congestion (edema),       erosions and erythema was found in the entire examined stomach.       Biopsies(3:BODY, 2:ANTRUM, 1:INCISURA) were taken with a cold forceps       for histology.      Localized mild inflammation characterized by congestion (edema) and       erythema was found in the duodenal bulb.      The second portion of the duodenum was normal. Impression:               - DYSPEPSIA DUE TO EROSIVE Gastritis/Duodenitis                            FROM ASA USE. Moderate Sedation:      Moderate (conscious) sedation was administered by the endoscopy nurse  and supervised by the endoscopist. The following parameters were       monitored: oxygen saturation, heart rate, blood pressure, and response       to care. Total physician intraservice time was 25 minutes. Recommendation:           - Patient has a contact number available for                            emergencies. The signs and symptoms of potential                            delayed complications were discussed with the                            patient. Return to normal activities tomorrow.                            Written discharge instructions were provided to the                             patient.                           - Low fat diet.                           - Continue present medications. ADD PROTONIX BID                            FOR ONE MO THEN ONCE DAILY FOR 2 MOS THEN PRN ASA                            USE.                           - Await pathology results.                           - Return to GI clinic in 4 months. Procedure Code(s):        --- Professional ---                           [phone], Esophagogastroduodenoscopy, flexible,                            transoral; with biopsy, single or multiple                           99153, Moderate sedation; each additional 15                            minutes intraservice time                           G0500, Moderate sedation services provided by the  same physician or other qualified health care                            professional performing a gastrointestinal                            endoscopic service that sedation supports,                            requiring the presence of an independent trained                            observer to assist in the monitoring of the                            patient's level of consciousness and physiological                            status; initial 15 minutes of intra-service time;                            patient age 74 years or older (additional time may                            be reported with [phone], as appropriate) Diagnosis Code(s):        --- Professional ---                           K29.70, Gastritis, unspecified, without bleeding                           K29.80, Duodenitis without bleeding                           R10.13, Epigastric pain                           R68.81, Early satiety                           R11.2, Nausea with vomiting, unspecified CPT copyright 2019 American Medical Association. All rights reserved. The codes documented in this report are preliminary and upon coder review may   be revised to meet current compliance requirements. Barney Drain, MD Barney Drain MD, MD 11/10/2019 12:31:31 PM This report has been signed electronically. Number of Addenda: 0

## 2019-11-13 ENCOUNTER — Other Ambulatory Visit: Payer: Self-pay

## 2019-11-13 ENCOUNTER — Telehealth: Payer: Self-pay

## 2019-11-13 ENCOUNTER — Encounter: Payer: Self-pay | Admitting: Gastroenterology

## 2019-11-13 LAB — SURGICAL PATHOLOGY

## 2019-11-13 NOTE — Telephone Encounter (Signed)
Rhea at pre-service center [phone], ext [phone]) called office and LMOVM, pt gave [facility] card when she arrived for EGD 11/10/19. EGD requires PA.  Attempted to submit PA for EGD via [employer] website for [place], [place] already started for EGD by [facility].   Tried to call Rhea, LMOVM to let me know if any further is needed for PA.

## 2019-11-14 NOTE — Progress Notes (Signed)
Patient scheduled.

## 2019-11-15 NOTE — Telephone Encounter (Signed)
[facility] called office requesting clinical notes for PA of EGD. Clinical notes faxed to [phone].

## 2019-11-22 NOTE — Telephone Encounter (Signed)
EGD approved. Reference# [id number], valid 11/10/19-02/08/20.

## 2019-12-19 ENCOUNTER — Ambulatory Visit: Payer: 59 | Admitting: Certified Nurse Midwife

## 2019-12-21 ENCOUNTER — Ambulatory Visit: Payer: 59 | Attending: Internal Medicine

## 2019-12-28 ENCOUNTER — Ambulatory Visit
Admission: EM | Admit: 2019-12-28 | Discharge: 2019-12-28 | Disposition: A | Discharge status: Home / Self Care | Payer: 59 | Attending: Emergency Medicine | Admitting: Emergency Medicine

## 2019-12-28 ENCOUNTER — Other Ambulatory Visit: Payer: Self-pay

## 2019-12-28 DIAGNOSIS — N764 Abscess of vulva: Secondary | ICD-10-CM

## 2019-12-28 MED ORDER — IBUPROFEN 800 MG PO TABS: 800.0000 mg | ORAL_TABLET | Freq: Three times a day (TID) | ORAL | 0 refills | Status: DC

## 2019-12-28 MED ORDER — DOXYCYCLINE HYCLATE 100 MG PO CAPS: 100.0000 mg | ORAL_CAPSULE | Freq: Two times a day (BID) | ORAL | 0 refills | Status: DC

## 2019-12-28 NOTE — ED Provider Notes (Signed)
[place]   [id number] 12/28/19 Arrival Time: [place]   [id number]  SUBJECTIVE:  Lindsey Sampson is a 39 y.o. female who presents with a possible abscess of her right labia majora.  Onset gradual, approximately 3 days ago.   ROS: As per HPI.  All other pertinent ROS negative.     Past Medical History:  Diagnosis Date  . Anxiety   . Colitis   . Cyst of spleen   . Depression   . Ovarian cyst   . Renal disorder    renal cyst   Past Surgical History:  Procedure Laterality Date  . BACK SURGERY    . BIOPSY  11/10/2019   Procedure: BIOPSY;  Surgeon: Danie Binder, MD;  [facility];  Service: Endoscopy;;  . CESAREAN SECTION    . CESAREAN SECTION  2006 and 2010  . cyst removed from ovary Right   . ESOPHAGOGASTRODUODENOSCOPY N/A 11/10/2019   Procedure: ESOPHAGOGASTRODUODENOSCOPY (EGD);  Surgeon: Danie Binder, MD;  [facility];  Service: Endoscopy;  Laterality: N/A;  12:00pm  . LUMBAR LAMINECTOMY/DECOMPRESSION MICRODISCECTOMY Left 05/14/2016   Procedure: LUMBAR DECOMPRESSION/MICRODISCECTOMY LEFT LUMBAR FOUR-FIVE;  Surgeon: Melina Schools, MD;  Location: [place];  Service: Orthopedics;  Laterality: Left;  . ORIF TOE FRACTURE Right 09/07/2019   Procedure: IRRIGATION AND DEBRIDEMENT AND OPEN REDUCTION INTERNAL FIXATION (ORIF) Proximal phalanx FRACTURE;  Surgeon: Shona Needles, MD;  Location: [facility];  Service: Orthopedics;  Laterality: Right;  . TUBAL LIGATION     Allergies  Allergen Reactions  . Prednisone Rash   No current facility-administered medications on file prior to encounter.   Current Outpatient Medications on File Prior to Encounter  Medication Sig Dispense Refill  . acetaminophen (TYLENOL) 500 MG tablet Take 500 mg by mouth every 6 (six) hours as needed for mild pain.    Marland Kitchen buPROPion (ZYBAN) 150 MG 12 hr tablet Take 150 mg by mouth 2 (two) times daily.    Marland Kitchen loperamide (IMODIUM) 2 MG capsule Take 2-4 mg by mouth 4 (four) times daily as  needed for diarrhea or loose stools.     . ondansetron (ZOFRAN) 8 MG tablet Take 8 mg by mouth 3 (three) times daily as needed for nausea/vomiting.    . pantoprazole (PROTONIX) 40 MG tablet 1 PO 30 MINUTES PRIOR TO MEALS BID FOR 1 MO THEN QD FOR 2 MOS 60 tablet 11  . [DISCONTINUED] clonazePAM (KLONOPIN) 0.5 MG tablet Take 0.5 mg by mouth 3 (three) times daily as needed. For anxiety/sleep.  1   Social History   Socioeconomic History  . Marital status: Single    Spouse name: Not on file  . Number of children: Not on file  . Years of education: Not on file  . Highest education level: Not on file  Occupational History  . Not on file  Tobacco Use  . Smoking status: Light Tobacco Smoker    Packs/day: 0.50    Types: Cigarettes  . Smokeless tobacco: Never Used  . Tobacco comment: trying to quit   Substance and Sexual Activity  . Alcohol use: Yes    Comment: rare  . Drug use: No  . Sexual activity: Yes    Partners: Male    Birth control/protection: Surgical  Other Topics Concern  . Not on file  Social History Narrative  . Not on file   Social Determinants of Health   Financial Resource Strain:   . Difficulty of Paying Living Expenses:   Food Insecurity:   .  Worried About Charity fundraiser in the Last Year:   . Arboriculturist in the Last Year:   Transportation Needs:   . Film/video editor (Medical):   Marland Kitchen Lack of Transportation (Non-Medical):   Physical Activity:   . Days of Exercise per Week:   . Minutes of Exercise per Session:   Stress:   . Feeling of Stress :   Social Connections:   . Frequency of Communication with Friends and Family:   . Frequency of Social Gatherings with Friends and Family:   . Attends Religious Services:   . Active Member of Clubs or Organizations:   . Attends Archivist Meetings:   Marland Kitchen Marital Status:   Intimate Partner Violence:   . Fear of Current or Ex-Partner:   . Emotionally Abused:   Marland Kitchen Physically Abused:   . Sexually  Abused:    Family History  Problem Relation Age of Onset  . Diabetes Mother   . Hypertension Mother   . High Cholesterol Mother   . Hypertension Father   . Prostate cancer Father   . High Cholesterol Father   . Colon polyps Maternal Grandmother   . Colon cancer Neg Hx     OBJECTIVE:  Vitals:   12/28/19 1719  BP: 118/80  Pulse: 93  Resp: 18  Temp: 98.3 F (36.8 C)  SpO2: 98%     General appearance: alert; no distress Skin: 2 cm induration of her labia major; tender to touch; no active drainage Psychological: alert and cooperative; normal mood and affect  Procedure: Verbal consent obtained. Area over induration cleaned with betadine. Lidocaine 2% with epinephrine used to obtain local anesthesia. The most fluctuant portion of the abscess was incised with a #11 blade scalpel. Abscess cavity explored and evacuated. Loculations broken up with a curved hemostat as best as possible given patient discomfort.  Drainage was not expressed. Minimal bleeding. No complications.  ASSESSMENT & PLAN:  1. Abscess of right genital labia     Meds ordered this encounter  Medications  . doxycycline (VIBRAMYCIN) 100 MG capsule    Sig: Take 1 capsule (100 mg total) by mouth 2 (two) times daily.    Dispense:  20 capsule    Refill:  0  . ibuprofen (ADVIL) 800 MG tablet    Sig: Take 1 tablet (800 mg total) by mouth 3 (three) times daily.    Dispense:  21 tablet    Refill:  0     Abscess with incision and drainage:  Keep dry and covered for next 24-48 hours Remove packing in 48 hours either at home or return here After packing is removed in you may then begin appling warm compresses 3-4x daily for 10-15 minutes.  You may then wash site daily with warm water and mild soap Keep covered to avoid friction Take antibiotic as prescribed and to completion Return sooner or go to the ED if you have any new or worsening symptoms such as increased redness, swelling, pain, nausea, vomiting, fever,  chills, etc...   Reviewed expectations re: course of current medical issues. Questions answered. Outlined signs and symptoms indicating need for more acute intervention. Patient verbalized understanding. After Visit Summary given.          Emerson Monte, [place] 12/28/19 1805

## 2019-12-28 NOTE — Discharge Instructions (Addendum)
Keep dry and covered for next 24-48 hours appling warm compresses 3-4x daily for 10-15 minutes.  You may then wash site daily with warm water and mild soap Keep covered to avoid friction Take antibiotic as prescribed and to completion Return sooner or go to the ED if you have any new or worsening symptoms such as increased redness, swelling, pain, nausea, vomiting, fever, chills, etc..Marland Kitchen

## 2019-12-28 NOTE — ED Triage Notes (Signed)
Pt states she has large abscess in groin area that is painfull and hard, had small amount of drainage earlier today

## 2020-01-02 ENCOUNTER — Ambulatory Visit: Payer: 59 | Admitting: Obstetrics & Gynecology

## 2020-01-02 DIAGNOSIS — R0602 Shortness of breath: Secondary | ICD-10-CM | POA: Diagnosis not present

## 2020-01-02 DIAGNOSIS — R5383 Other fatigue: Secondary | ICD-10-CM | POA: Diagnosis not present

## 2020-01-02 DIAGNOSIS — A084 Viral intestinal infection, unspecified: Secondary | ICD-10-CM | POA: Diagnosis not present

## 2020-01-02 DIAGNOSIS — Z20822 Contact with and (suspected) exposure to covid-19: Secondary | ICD-10-CM | POA: Diagnosis not present

## 2020-02-20 ENCOUNTER — Ambulatory Visit: Payer: Self-pay | Admitting: Student

## 2020-02-20 DIAGNOSIS — S92411B Displaced fracture of proximal phalanx of right great toe, initial encounter for open fracture: Secondary | ICD-10-CM | POA: Insufficient documentation

## 2020-03-13 ENCOUNTER — Ambulatory Visit (INDEPENDENT_AMBULATORY_CARE_PROVIDER_SITE_OTHER): Payer: 59 | Admitting: Podiatry

## 2020-03-13 ENCOUNTER — Other Ambulatory Visit: Payer: Self-pay

## 2020-03-13 DIAGNOSIS — L600 Ingrowing nail: Secondary | ICD-10-CM

## 2020-03-14 ENCOUNTER — Encounter: Payer: Self-pay | Admitting: Podiatry

## 2020-03-14 NOTE — Progress Notes (Signed)
Subjective:  Patient ID: Lindsey Sampson, female    DOB: 1981-08-02,  MRN: [id number]  Chief Complaint  Patient presents with  . Nail Problem    Pt states right 1st toenail lateral border ingrown 1 1/2 month duration, has blood/pus drainage and is very painful. Pt states she is on sulfa antibiotic.    39 y.o. female presents with the above complaint.  Patient presents with right hallux lateral border ingrown.  Patient states is been going on for 1 and half months is progressing on worse.  Patient is on sulfa antibiotic such as Bactrim.  She states that she has noticed some drainage and purulent pus noted.  She denies any other acute complaints.  She would like to have it removed has been causing her a lot of pain.  She denies seeing anyone else prior to seeing me.  She was sent here by her primary care doctor.   Review of Systems: Negative except as noted in the HPI. Denies N/V/F/Ch.  Past Medical History:  Diagnosis Date  . Anxiety   . Colitis   . Cyst of spleen   . Depression   . Ovarian cyst   . Renal disorder    renal cyst    Current Outpatient Medications:  .  acetaminophen (TYLENOL) 500 MG tablet, Take 500 mg by mouth every 6 (six) hours as needed for mild pain., Disp: , Rfl:  .  buPROPion (WELLBUTRIN) 75 MG tablet, TAKE 2 TABLETS BY MOUTH ONCE DAILY FOR ANXIETY AND SMOKE CESSATION, Disp: , Rfl:  .  buPROPion (ZYBAN) 150 MG 12 hr tablet, Take 150 mg by mouth 2 (two) times daily., Disp: , Rfl:  .  doxycycline (VIBRAMYCIN) 100 MG capsule, Take 1 capsule (100 mg total) by mouth 2 (two) times daily., Disp: 20 capsule, Rfl: 0 .  HYDROcodone-acetaminophen (NORCO/VICODIN) 5-325 MG tablet, Take 1 tablet by mouth every 8 (eight) hours as needed., Disp: , Rfl:  .  ibuprofen (ADVIL) 800 MG tablet, Take 1 tablet (800 mg total) by mouth 3 (three) times daily., Disp: 21 tablet, Rfl: 0 .  loperamide (IMODIUM) 2 MG capsule, Take 2-4 mg by mouth 4 (four) times daily as needed for diarrhea or  loose stools. , Disp: , Rfl:  .  ondansetron (ZOFRAN) 8 MG tablet, Take 8 mg by mouth 3 (three) times daily as needed for nausea/vomiting., Disp: , Rfl:  .  pantoprazole (PROTONIX) 40 MG tablet, 1 PO 30 MINUTES PRIOR TO MEALS BID FOR 1 MO THEN QD FOR 2 MOS, Disp: 60 tablet, Rfl: 11 .  sulfamethoxazole-trimethoprim (BACTRIM DS) 800-160 MG tablet, Take 1 tablet by mouth 2 (two) times daily., Disp: , Rfl:   Social History   Tobacco Use  Smoking Status Light Tobacco Smoker  . Packs/day: 0.50  . Types: Cigarettes  Smokeless Tobacco Never Used  Tobacco Comment   trying to quit     Allergies  Allergen Reactions  . Prednisone Rash   Objective:  There were no vitals filed for this visit. There is no height or weight on file to calculate BMI. Constitutional Well developed. Well nourished.  Vascular Dorsalis pedis pulses palpable bilaterally. Posterior tibial pulses palpable bilaterally. Capillary refill normal to all digits.  No cyanosis or clubbing noted. Pedal hair growth normal.  Neurologic Normal speech. Oriented to person, place, and time. Epicritic sensation to light touch grossly present bilaterally.  Dermatologic Painful ingrowing nail at lateral nail borders of the hallux nail right. No other open wounds. No skin lesions.  Orthopedic: Normal joint ROM without pain or crepitus bilaterally. No visible deformities. No bony tenderness.   Radiographs: None Assessment:  No diagnosis found. Plan:  Patient was evaluated and treated and all questions answered.  Ingrown Nail, right -Patient elects to proceed with minor surgery to remove ingrown toenail removal today. Consent reviewed and signed by patient. -Ingrown nail excised. See procedure note. -Educated on post-procedure care including soaking. Written instructions provided and reviewed. -Patient to follow up in 2 weeks for nail check.  Procedure: Excision of Ingrown Toenail Location: Right 1st toe lateral nail  borders. Anesthesia: Lidocaine 1% plain; 1.5 mL and Marcaine 0.5% plain; 1.5 mL, digital block. Skin Prep: Betadine. Dressing: Silvadene; telfa; dry, sterile, compression dressing. Technique: Following skin prep, the toe was exsanguinated and a tourniquet was secured at the base of the toe. The affected nail border was freed, split with a nail splitter, and excised. Chemical matrixectomy was then performed with phenol and irrigated out with alcohol. The tourniquet was then removed and sterile dressing applied. Disposition: Patient tolerated procedure well. Patient to return in 2 weeks for follow-up.   No follow-ups on file.

## 2020-03-18 ENCOUNTER — Ambulatory Visit: Payer: 59 | Admitting: Gastroenterology

## 2020-03-29 ENCOUNTER — Encounter (HOSPITAL_COMMUNITY): Payer: Self-pay | Admitting: Student

## 2020-03-29 ENCOUNTER — Other Ambulatory Visit: Payer: Self-pay

## 2020-03-29 NOTE — Progress Notes (Signed)
Spoke with pt for pre-op call. Pt denies cardiac history, HTN or Diabetes.  Covid test scheduled for 04/02/20. Pt given quarantine instructions and understands she needs to stay in quarantine after Covid test is done until she comes for surgery on Wednesday.   ERAS protocol ordered. Pt will come by [facility] on Tuesday after getting her Covid test done and I will take her a bottle of Pre-surgery Ensure to her in her car. Instructed pt not to eat food after midnight, but may have clear liquids until 5:30 AM. Pt is to drink the Pre-surgery Ensure between 5:15 AM and 5:30 AM, it will be the last liquids that she will drink that morning. Pt given list of clear liquids. Pt voiced understanding.

## 2020-04-02 ENCOUNTER — Other Ambulatory Visit (HOSPITAL_COMMUNITY)
Admission: RE | Admit: 2020-04-02 | Discharge: 2020-04-02 | Disposition: A | Discharge status: Home / Self Care | Payer: 59 | Source: Ambulatory Visit | Attending: Student | Admitting: Student

## 2020-04-02 DIAGNOSIS — Z20822 Contact with and (suspected) exposure to covid-19: Secondary | ICD-10-CM | POA: Insufficient documentation

## 2020-04-02 DIAGNOSIS — Z01812 Encounter for preprocedural laboratory examination: Secondary | ICD-10-CM | POA: Insufficient documentation

## 2020-04-02 LAB — SARS CORONAVIRUS 2 (TAT 6-24 HRS): SARS Coronavirus 2: NEGATIVE

## 2020-04-02 NOTE — H&P (Signed)
[facility]thopaedic Trauma Service (OTS) H&P  Patient ID: KENNEDEE Sampson MRN: [id number] DOB/AGE: [birth year] 40 y.o.  Reason for Surgery: Right great toe nonunion  HPI: Lindsey Sampson is an 39 y.o. female presenting for surgery on right great toe.  Patient sustained a work-related injury, resulting in a right open great toe proximal phalanx fracture.  Patient subsequently underwent irrigation debridement and percutaneous fixation of the right great toe.  Patient has followed up in OTS clinic on regular intervals since time of initial surgery and has advanced to weightbearing as tolerated on the right foot.  Over the last 7 months her toe fracture has failed to heal.  She now presents for repair of a nonunion.   Past Medical History:  Diagnosis Date   Anxiety    Colitis    Cyst of spleen    Depression    Ovarian cyst    Renal disorder    renal cyst,     Past Surgical History:  Procedure Laterality Date   BACK SURGERY     BIOPSY  11/10/2019   Procedure: BIOPSY;  Surgeon: Danie Binder, MD;  [facility]: [facility];  Service: Endoscopy;;   CESAREAN SECTION     CESAREAN SECTION  2006 and 2010   cyst removed from ovary Right    ESOPHAGOGASTRODUODENOSCOPY N/A 11/10/2019   Procedure: ESOPHAGOGASTRODUODENOSCOPY (EGD);  Surgeon: Danie Binder, MD;  Location: [facility];  Service: Endoscopy;  Laterality: N/A;  12:00pm   LUMBAR LAMINECTOMY/DECOMPRESSION MICRODISCECTOMY Left 05/14/2016   Procedure: LUMBAR DECOMPRESSION/MICRODISCECTOMY LEFT LUMBAR FOUR-FIVE;  Surgeon: Melina Schools, MD;  Location: [facility];  Service: Orthopedics;  Laterality: Left;   ORIF TOE FRACTURE Right 09/07/2019   Procedure: IRRIGATION AND DEBRIDEMENT AND OPEN REDUCTION INTERNAL FIXATION (ORIF) Proximal phalanx FRACTURE;  Surgeon: Shona Needles, MD;  Location: [facility];  Service: Orthopedics;  Laterality: Right;   TUBAL LIGATION      Family History  Problem Relation Age of Onset   Diabetes Mother     Hypertension Mother    High Cholesterol Mother    Hypertension Father    Prostate cancer Father    High Cholesterol Father    Colon polyps Maternal Grandmother    Colon cancer Neg Hx     Social History:  reports that she has been smoking cigarettes. She has been smoking about 0.50 packs per day. She has never used smokeless tobacco. She reports current alcohol use. She reports that she does not use drugs.  Allergies:  Allergies  Allergen Reactions   Prednisone Rash    Medications:  Current Meds  Medication Sig   acetaminophen (TYLENOL) 500 MG tablet Take 500 mg by mouth every 6 (six) hours as needed for mild pain.   buPROPion (ZYBAN) 150 MG 12 hr tablet Take 150 mg by mouth 2 (two) times daily.   ondansetron (ZOFRAN) 8 MG tablet Take 8 mg by mouth 3 (three) times daily as needed for nausea/vomiting.   pantoprazole (PROTONIX) 40 MG tablet 1 PO 30 MINUTES PRIOR TO MEALS BID FOR 1 MO THEN QD FOR 2 MOS (Patient taking differently: Take 40 mg by mouth daily as needed (acid reflux). )     ROS: Constitutional: No fever or chills Vision: No changes in vision ENT: No difficulty swallowing CV: No chest pain Pulm: No SOB or wheezing GI: No nausea or vomiting GU: No urgency or inability to hold urine Skin: No poor wound healing Neurologic: No numbness or tingling Psychiatric: No depression or anxiety Heme:  No bruising Allergic: No reaction to medications or food   Exam: Last menstrual period 03/21/2020. General: No acute distress Orientation: Alert and oriented x4 Mood and Affect: Mood and affect appropriate.  Pleasant and cooperative Gait: Ambulates with a relatively smooth, steady gait.  Normal reciprocal gait pattern Coordination and balance: Within normal limits   Right lower extremity: Well-healed surgical incisions over great toe.  Some tenderness with palpation of the toe.  Nontender with palpation of her remaining digits.  Has some decreased sensation with  light touch over the great toe.  Sensation is fully intact over her remaining toes and throughout her foot.  Decreased range of motion of the great toe.  Neurovascularly intact  Left lower extremity: Skin without lesions. No tenderness to palpation. Full painless ROM, full strength in each muscle groups without evidence of instability.   Medical Decision Making: Data: Imaging: [facility] and lateral views of the right great toe shows a proximal phalanx fracture that is not fully healed  Labs: No results found for this or any previous visit (from the past 168 hour(s)).   Assessment/Plan: 39 year old female s/p I&D and percutaneous fixation of right open great toe proximal phalanx fracture on 09/07/2019  Patient now almost 7 months out status post the above injury and surgery.  Her fracture has gone on to nonunion.  She continues to have significant amount of pain in the toe.  Due to patient's activity level and  occupation along with her continued pain, I feel it is most appropriate to proceed with repair of the nonunion.  Risks and benefits of the procedure were discussed with the patient. Risks discussed included bleeding, infection, damage to surrounding nerves and blood vessels, pain, stiffness, post-traumatic arthritis, failure of fracture to heal, and associated anesthesia complications.  Patient states understanding of the risk and agrees to proceed with surgery.  We will plan for patient be discharged postoperatively from PACU.   [name] [name] [facility] [phone] (office) [email or web address]

## 2020-04-02 NOTE — Anesthesia Preprocedure Evaluation (Addendum)
Anesthesia Evaluation  Patient identified by MRN, date of birth, ID band Patient awake    Reviewed: Allergy & Precautions, H&P , NPO status , Patient's Chart, lab work & pertinent test results  Airway Mallampati: II  TM Distance: >3 FB Neck ROM: Full    Dental no notable dental hx. (+) Teeth Intact, Dental Advisory Given   Pulmonary neg pulmonary ROS, Current Smoker and Patient abstained from smoking.,    Pulmonary exam normal breath sounds clear to auscultation       Cardiovascular Exercise Tolerance: Good negative cardio ROS   Rhythm:Regular Rate:Normal     Neuro/Psych Anxiety Depression negative neurological ROS     GI/Hepatic negative GI ROS, Neg liver ROS,   Endo/Other  negative endocrine ROS  Renal/GU negative Renal ROS  negative genitourinary   Musculoskeletal   Abdominal   Peds  Hematology negative hematology ROS (+)   Anesthesia Other Findings   Reproductive/Obstetrics negative OB ROS                            Anesthesia Physical Anesthesia Plan  ASA: II  Anesthesia Plan: General   Post-op Pain Management:  Regional for Post-op pain   Induction: Intravenous  PONV Risk Score and Plan: 3 and Ondansetron, Dexamethasone and Midazolam  Airway Management Planned: LMA  Additional Equipment:   Intra-op Plan:   Post-operative Plan: Extubation in OR  Informed Consent: I have reviewed the patients History and Physical, chart, labs and discussed the procedure including the risks, benefits and alternatives for the proposed anesthesia with the patient or authorized representative who has indicated his/her understanding and acceptance.       Plan Discussed with: CRNA and Surgeon  Anesthesia Plan Comments:       Anesthesia Quick Evaluation

## 2020-04-03 ENCOUNTER — Ambulatory Visit (HOSPITAL_COMMUNITY): Payer: 59

## 2020-04-03 ENCOUNTER — Encounter (HOSPITAL_COMMUNITY): Payer: Self-pay | Admitting: Student

## 2020-04-03 ENCOUNTER — Ambulatory Visit (HOSPITAL_COMMUNITY): Payer: 59 | Admitting: Certified Registered Nurse Anesthetist

## 2020-04-03 ENCOUNTER — Ambulatory Visit (HOSPITAL_COMMUNITY)
Admission: RE | Admit: 2020-04-03 | Discharge: 2020-04-03 | Disposition: A | Discharge status: Home / Self Care | Payer: 59 | Attending: Student | Admitting: Student

## 2020-04-03 ENCOUNTER — Other Ambulatory Visit: Payer: Self-pay

## 2020-04-03 ENCOUNTER — Encounter (HOSPITAL_COMMUNITY)
Admission: RE | Disposition: A | Discharge status: Home / Self Care | Payer: Self-pay | Source: Home / Self Care | Attending: Student

## 2020-04-03 DIAGNOSIS — F17210 Nicotine dependence, cigarettes, uncomplicated: Secondary | ICD-10-CM | POA: Diagnosis not present

## 2020-04-03 DIAGNOSIS — S92411K Displaced fracture of proximal phalanx of right great toe, subsequent encounter for fracture with nonunion: Secondary | ICD-10-CM | POA: Insufficient documentation

## 2020-04-03 DIAGNOSIS — Z8349 Family history of other endocrine, nutritional and metabolic diseases: Secondary | ICD-10-CM | POA: Insufficient documentation

## 2020-04-03 DIAGNOSIS — S92403K Displaced unspecified fracture of unspecified great toe, subsequent encounter for fracture with nonunion: Secondary | ICD-10-CM

## 2020-04-03 DIAGNOSIS — Z8042 Family history of malignant neoplasm of prostate: Secondary | ICD-10-CM | POA: Insufficient documentation

## 2020-04-03 DIAGNOSIS — Z8249 Family history of ischemic heart disease and other diseases of the circulatory system: Secondary | ICD-10-CM | POA: Insufficient documentation

## 2020-04-03 DIAGNOSIS — F419 Anxiety disorder, unspecified: Secondary | ICD-10-CM | POA: Diagnosis not present

## 2020-04-03 DIAGNOSIS — Z8371 Family history of colonic polyps: Secondary | ICD-10-CM | POA: Diagnosis not present

## 2020-04-03 DIAGNOSIS — S92411B Displaced fracture of proximal phalanx of right great toe, initial encounter for open fracture: Secondary | ICD-10-CM

## 2020-04-03 DIAGNOSIS — Z833 Family history of diabetes mellitus: Secondary | ICD-10-CM | POA: Insufficient documentation

## 2020-04-03 DIAGNOSIS — Z419 Encounter for procedure for purposes other than remedying health state, unspecified: Secondary | ICD-10-CM

## 2020-04-03 DIAGNOSIS — Z79899 Other long term (current) drug therapy: Secondary | ICD-10-CM | POA: Insufficient documentation

## 2020-04-03 DIAGNOSIS — X58XXXD Exposure to other specified factors, subsequent encounter: Secondary | ICD-10-CM | POA: Insufficient documentation

## 2020-04-03 DIAGNOSIS — F329 Major depressive disorder, single episode, unspecified: Secondary | ICD-10-CM | POA: Diagnosis not present

## 2020-04-03 DIAGNOSIS — Z888 Allergy status to other drugs, medicaments and biological substances status: Secondary | ICD-10-CM | POA: Insufficient documentation

## 2020-04-03 HISTORY — PX: ORIF TOE FRACTURE: SHX5032

## 2020-04-03 LAB — CBC
HCT: 34.1 % — ABNORMAL LOW (ref 36.0–46.0)
Hemoglobin: 11.3 g/dL — ABNORMAL LOW (ref 12.0–15.0)
MCH: 31.7 pg (ref 26.0–34.0)
MCHC: 33.1 g/dL (ref 30.0–36.0)
MCV: 95.5 fL (ref 80.0–100.0)
Platelets: 190 10*3/uL (ref 150–400)
RBC: 3.57 MIL/uL — ABNORMAL LOW (ref 3.87–5.11)
RDW: 12.4 % (ref 11.5–15.5)
WBC: 5.6 10*3/uL (ref 4.0–10.5)
nRBC: 0 % (ref 0.0–0.2)

## 2020-04-03 LAB — POCT PREGNANCY, URINE: Preg Test, Ur: NEGATIVE

## 2020-04-03 SURGERY — OPEN REDUCTION INTERNAL FIXATION (ORIF) METATARSAL (TOE) FRACTURE
Anesthesia: General | Site: Toe | Laterality: Right

## 2020-04-03 MED ORDER — ORAL CARE MOUTH RINSE: 15.0000 mL | Freq: Once | OROMUCOSAL | Status: AC

## 2020-04-03 MED ORDER — CLONIDINE HCL (ANALGESIA) 100 MCG/ML EP SOLN
EPIDURAL | Status: DC | PRN
Start: 2020-04-03 — End: 2020-04-03
  Administered 2020-04-03: 50 ug

## 2020-04-03 MED ORDER — MIDAZOLAM HCL 2 MG/2ML IJ SOLN
INTRAMUSCULAR | Status: AC
  Filled 2020-04-03: qty 2

## 2020-04-03 MED ORDER — VANCOMYCIN HCL 1000 MG IV SOLR
INTRAVENOUS | Status: AC
  Filled 2020-04-03: qty 1000

## 2020-04-03 MED ORDER — ONDANSETRON HCL 4 MG/2ML IJ SOLN
INTRAMUSCULAR | Status: DC | PRN
  Administered 2020-04-03: 4 mg via INTRAVENOUS

## 2020-04-03 MED ORDER — CELECOXIB 200 MG PO CAPS
200.0000 mg | ORAL_CAPSULE | Freq: Once | ORAL | Status: AC
  Administered 2020-04-03: 200 mg via ORAL
  Filled 2020-04-03: qty 1

## 2020-04-03 MED ORDER — PHENYLEPHRINE HCL (PRESSORS) 10 MG/ML IV SOLN
INTRAVENOUS | Status: DC | PRN
  Administered 2020-04-03: 80 ug via INTRAVENOUS
  Administered 2020-04-03: 40 ug via INTRAVENOUS
  Administered 2020-04-03 (×2): 80 ug via INTRAVENOUS

## 2020-04-03 MED ORDER — DEXAMETHASONE SODIUM PHOSPHATE 4 MG/ML IJ SOLN
INTRAMUSCULAR | Status: DC | PRN
  Administered 2020-04-03: 4 mg via INTRAVENOUS

## 2020-04-03 MED ORDER — PROPOFOL 10 MG/ML IV BOLUS
INTRAVENOUS | Status: AC
  Filled 2020-04-03: qty 20

## 2020-04-03 MED ORDER — HYDROCODONE-ACETAMINOPHEN 5-325 MG PO TABS: 1.0000 | ORAL_TABLET | Freq: Four times a day (QID) | ORAL | 0 refills | Status: DC | PRN

## 2020-04-03 MED ORDER — PROPOFOL 10 MG/ML IV BOLUS
INTRAVENOUS | Status: DC | PRN
  Administered 2020-04-03: 150 mg via INTRAVENOUS

## 2020-04-03 MED ORDER — FENTANYL CITRATE (PF) 250 MCG/5ML IJ SOLN
INTRAMUSCULAR | Status: AC
  Filled 2020-04-03: qty 5

## 2020-04-03 MED ORDER — ACETAMINOPHEN 500 MG PO TABS
1000.0000 mg | ORAL_TABLET | Freq: Once | ORAL | Status: AC
  Administered 2020-04-03: 1000 mg via ORAL
  Filled 2020-04-03: qty 2

## 2020-04-03 MED ORDER — 0.9 % SODIUM CHLORIDE (POUR BTL) OPTIME
TOPICAL | Status: DC | PRN
  Administered 2020-04-03: 1000 mL

## 2020-04-03 MED ORDER — LACTATED RINGERS IV SOLN: INTRAVENOUS | Status: DC

## 2020-04-03 MED ORDER — FENTANYL CITRATE (PF) 100 MCG/2ML IJ SOLN
INTRAMUSCULAR | Status: DC | PRN
  Administered 2020-04-03: 100 ug via INTRAVENOUS
  Administered 2020-04-03: 50 ug via INTRAVENOUS

## 2020-04-03 MED ORDER — TOBRAMYCIN SULFATE 1.2 G IJ SOLR
INTRAMUSCULAR | Status: AC
  Filled 2020-04-03: qty 1.2

## 2020-04-03 MED ORDER — CEFAZOLIN SODIUM-DEXTROSE 2-4 GM/100ML-% IV SOLN
2.0000 g | INTRAVENOUS | Status: AC
  Administered 2020-04-03: 2 g via INTRAVENOUS
  Filled 2020-04-03: qty 100

## 2020-04-03 MED ORDER — BUPIVACAINE-EPINEPHRINE (PF) 0.5% -1:200000 IJ SOLN
INTRAMUSCULAR | Status: DC | PRN
  Administered 2020-04-03: 30 mL via PERINEURAL

## 2020-04-03 MED ORDER — CHLORHEXIDINE GLUCONATE 0.12 % MT SOLN
15.0000 mL | Freq: Once | OROMUCOSAL | Status: AC
  Administered 2020-04-03: 15 mL via OROMUCOSAL
  Filled 2020-04-03: qty 15

## 2020-04-03 MED ORDER — MIDAZOLAM HCL 5 MG/5ML IJ SOLN
INTRAMUSCULAR | Status: DC | PRN
  Administered 2020-04-03: 2 mg via INTRAVENOUS

## 2020-04-03 MED ORDER — HYDROMORPHONE HCL 1 MG/ML IJ SOLN: 0.2500 mg | INTRAMUSCULAR | Status: DC | PRN

## 2020-04-03 SURGICAL SUPPLY — 83 items
APL PRP STRL LF DISP 70% ISPRP (MISCELLANEOUS) ×2
BANDAGE ESMARK 6X9 LF (GAUZE/BANDAGES/DRESSINGS) ×1 IMPLANT
BIT DRILL 1.1 (BIT) ×2
BIT DRILL 1.1MM (BIT) ×1
BIT DRILL 60X20X1.1XQC TMX (BIT) IMPLANT
BIT DRL 60X20X1.1XQC TMX (BIT) ×1
BLADE SURG 10 STRL SS (BLADE) ×3 IMPLANT
BNDG CMPR 9X6 STRL LF SNTH (GAUZE/BANDAGES/DRESSINGS) ×1
BNDG COHESIVE 4X5 TAN STRL (GAUZE/BANDAGES/DRESSINGS) ×3 IMPLANT
BNDG CONFORM 3 STRL LF (GAUZE/BANDAGES/DRESSINGS) ×2 IMPLANT
BNDG ELASTIC 2X5.8 VLCR STR LF (GAUZE/BANDAGES/DRESSINGS) ×3 IMPLANT
BNDG ELASTIC 4X5.8 VLCR STR LF (GAUZE/BANDAGES/DRESSINGS) IMPLANT
BNDG ELASTIC 6X5.8 VLCR STR LF (GAUZE/BANDAGES/DRESSINGS) IMPLANT
BNDG ESMARK 6X9 LF (GAUZE/BANDAGES/DRESSINGS) ×3
BNDG GAUZE ELAST 4 BULKY (GAUZE/BANDAGES/DRESSINGS) ×6 IMPLANT
BRUSH SCRUB EZ PLAIN DRY (MISCELLANEOUS) ×6 IMPLANT
CHLORAPREP W/TINT 26 (MISCELLANEOUS) ×5 IMPLANT
COVER SURGICAL LIGHT HANDLE (MISCELLANEOUS) ×6 IMPLANT
COVER WAND RF STERILE (DRAPES) ×3 IMPLANT
DRAPE C-ARM 42X72 X-RAY (DRAPES) ×2 IMPLANT
DRAPE C-ARMOR (DRAPES) ×3 IMPLANT
DRAPE U-SHAPE 47X51 STRL (DRAPES) ×7 IMPLANT
DRIVER BIT 1.5 (TRAUMA) ×4 IMPLANT
DRSG EMULSION OIL 3X3 NADH (GAUZE/BANDAGES/DRESSINGS) IMPLANT
ELECT REM PT RETURN 9FT ADLT (ELECTROSURGICAL) ×3
ELECTRODE REM PT RTRN 9FT ADLT (ELECTROSURGICAL) ×1 IMPLANT
GAUZE SPONGE 4X4 12PLY STRL (GAUZE/BANDAGES/DRESSINGS) ×2 IMPLANT
GLOVE BIO SURGEON STRL SZ 6.5 (GLOVE) ×6 IMPLANT
GLOVE BIO SURGEON STRL SZ7.5 (GLOVE) ×12 IMPLANT
GLOVE BIO SURGEONS STRL SZ 6.5 (GLOVE) ×3
GLOVE BIOGEL PI IND STRL 6.5 (GLOVE) ×1 IMPLANT
GLOVE BIOGEL PI IND STRL 7.5 (GLOVE) ×1 IMPLANT
GLOVE BIOGEL PI INDICATOR 6.5 (GLOVE) ×2
GLOVE BIOGEL PI INDICATOR 7.5 (GLOVE) ×2
GOWN STRL REUS W/ TWL LRG LVL3 (GOWN DISPOSABLE) ×2 IMPLANT
GOWN STRL REUS W/TWL LRG LVL3 (GOWN DISPOSABLE) ×6
K-WIRE .045X6 DBL TRO NS (WIRE) ×6
K-WIRE DBL TRONS .035X6 (WIRE) ×6
KIT BASIN OR (CUSTOM PROCEDURE TRAY) ×3 IMPLANT
KIT PLATELET BIOCUE 18G 30 SYR (KITS) ×2 IMPLANT
KIT TURNOVER KIT B (KITS) ×3 IMPLANT
KWIRE .045X6 DBL TRO NS (WIRE) IMPLANT
KWIRE DBL TRONS .035X6 (WIRE) IMPLANT
LOCK SCREW 1.5X20MM (Screw) ×6 IMPLANT
MANIFOLD NEPTUNE II (INSTRUMENTS) ×1 IMPLANT
NDL HYPO 21X1.5 SAFETY (NEEDLE) IMPLANT
NEEDLE HYPO 21X1.5 SAFETY (NEEDLE) IMPLANT
NON LOCK SCREW 1.5X20MM (Screw) ×3 IMPLANT
NS IRRIG 1000ML POUR BTL (IV SOLUTION) ×3 IMPLANT
PACK ORTHO EXTREMITY (CUSTOM PROCEDURE TRAY) ×3 IMPLANT
PAD ARMBOARD 7.5X6 YLW CONV (MISCELLANEOUS) ×6 IMPLANT
PAD CAST 4YDX4 CTTN HI CHSV (CAST SUPPLIES) IMPLANT
PADDING CAST COTTON 4X4 STRL (CAST SUPPLIES)
PADDING CAST COTTON 6X4 STRL (CAST SUPPLIES) IMPLANT
PENCIL BUTTON HOLSTER BLD 10FT (ELECTRODE) ×3 IMPLANT
PLATE T SMALL 1.5MM (Plate) ×3 IMPLANT
PUTTY DBM STAGRAFT PLUS 2CC (Putty) ×2 IMPLANT
SCREW 1.5X18MM (Screw) ×3 IMPLANT
SCREW BN 18X1.5XST NONLOCK (Screw) ×1 IMPLANT
SCREW LOCK 1.5X20MM (Screw) ×2 IMPLANT
SCREW LOCKING 1.5X16 (Screw) ×2 IMPLANT
SCREW LOCKING 1.5X18MM (Screw) ×2 IMPLANT
SCREW NON LOCK 1.5X20MM (Screw) IMPLANT
SCREW NONIOC 1.5 16M (Screw) ×4 IMPLANT
SPONGE LAP 18X18 RF (DISPOSABLE) ×9 IMPLANT
STAPLER VISISTAT 35W (STAPLE) IMPLANT
SUCTION FRAZIER HANDLE 10FR (MISCELLANEOUS) ×3
SUCTION TUBE FRAZIER 10FR DISP (MISCELLANEOUS) ×1 IMPLANT
SUT ETHILON 2 0 FS 18 (SUTURE) ×9 IMPLANT
SUT ETHILON 3 0 PS 1 (SUTURE) ×6 IMPLANT
SUT MNCRL AB 3-0 PS2 18 (SUTURE) ×3 IMPLANT
SUT MON AB 2-0 CT1 36 (SUTURE) ×3 IMPLANT
SUT PDS AB 2-0 CT1 27 (SUTURE) IMPLANT
SUT VIC AB 2-0 CT1 27 (SUTURE) ×6
SUT VIC AB 2-0 CT1 TAPERPNT 27 (SUTURE) ×2 IMPLANT
SUT VIC AB 2-0 CT3 27 (SUTURE) IMPLANT
SYR CONTROL 10ML LL (SYRINGE) IMPLANT
TOWEL GREEN STERILE (TOWEL DISPOSABLE) ×6 IMPLANT
TOWEL GREEN STERILE FF (TOWEL DISPOSABLE) ×3 IMPLANT
TUBE CONNECTING 12'X1/4 (SUCTIONS) ×1
TUBE CONNECTING 12X1/4 (SUCTIONS) ×2 IMPLANT
UNDERPAD 30X36 HEAVY ABSORB (UNDERPADS AND DIAPERS) ×3 IMPLANT
WATER STERILE IRR 1000ML POUR (IV SOLUTION) ×3 IMPLANT

## 2020-04-03 NOTE — Op Note (Signed)
Orthopaedic Surgery Operative Note (CSN: [id number] ) Date of Surgery: 04/03/2020  Admit Date: 04/03/2020   Diagnoses: Pre-Op Diagnoses: Right great toe proximal phalanx nonunion  Post-Op Diagnosis: Same  Procedures: 1. CPT 28505-Open reduction internal fixation of right proximal phalanx fracture/nonunion 2. CPT 28310-Correction of right proximal phalanx mal/nonunion 3. CPT 38232-Left pelvis bone marrow aspirate harvest  Surgeons : Primary: Shona Needles, MD  Assistant: Patrecia Pace, PA-C  Location: OR 7   Anesthesia:General with regional anesthesia  Antibiotics: Ancef 2g preop   Tourniquet time:None used  Estimated Blood HMCN:47 mL  Complications:None   Specimens:None   Implants: Implant Name Type Inv. Item Serial No. Manufacturer Lot No. LRB No. Used Action  PLATE T SMALL 0.9GG - [id number] Plate PLATE T SMALL 5.4YT  [employer](ORTH,TRAU,BIO,SG)  Right 1 Implanted  SCREW NONIOC 1.5 96M - [id number] Screw SCREW NONIOC 1.5 96M  [employer](ORTH,TRAU,BIO,SG)  Right 2 Implanted  SCREW 1.5X18MM - [id number] Screw SCREW 1.5X18MM  ZIMMER [employer]ON(ORTH,TRAU,BIO,SG)  Right 1 Implanted  NON LOCK SCREW 1.5X20MM - [id number] Screw NON LOCK SCREW 1.5X20MM  [employer](ORTH,TRAU,BIO,SG)  Right 1 Implanted  PUTTY DBM STAGRAFT PLUS 2CC - [id number] Putty PUTTY DBM STAGRAFT PLUS 2CC  [employer](ORTH,TRAU,BIO,SG) [id number] Right 1 Implanted  SCREW LOCKING 1.5X18MM - [id number] Screw SCREW LOCKING 1.5X18MM  [employer](ORTH,TRAU,BIO,SG)  Right 1 Implanted  LOCK SCREW 1.5X20MM - [id number] Screw LOCK SCREW 1.5X20MM  [employer](ORTH,TRAU,BIO,SG)  Right 2 Implanted  SCREW LOCKING 1.5X16 - [id number] Screw SCREW LOCKING 1.5X16  [employer](ORTH,TRAU,BIO,SG)  Right 1 Implanted     Indications for Surgery: 39 year old female who sustained an open right proximal phalanx fracture of the great toe in December 2020.  She underwent I&D and percutaneous fixation of her proximal phalanx fracture.  She  subsequently was doing well however she developed a nonunion that showed no progression of her healing on CT scan or x-rays.  Due to the continued deformity and pain I recommended proceeding with open reduction had takedown of the nonunion with bone grafting using bone marrow aspirate concentrate.  I have discussed risks and benefits with the patient.  Risks included but not limited to bleeding, infection, continued nonunion, malunion, continued pain, nerve or blood vessel injury, need for limited weightbearing, DVT, even possibility anesthetic complications.  The patient agreed to proceed with surgery and consent was obtained.  Operative Findings: 1.  Open takedown of previous mal/nonunion with debridement of nonunion site. 2.  Open reduction internal fixation of right proximal phalanx fracture of the great toe using [employer] 1.5 mm T plate 3.  Bone grafting using harvest of bone marrow aspirate concentrate from right hemipelvis combined with DBM Stagraft putty  Procedure: The patient was identified in the preoperative holding area. Consent was confirmed with the patient and their family and all questions were answered. The operative extremity was marked after confirmation with the patient. she was then brought back to the operating room by our anesthesia colleagues.  She was placed under general anesthetic and carefully transferred over to a radiolucent flat top table.  The right lower extremity and right pelvis was prepped and draped in usual sterile fashion.  A timeout was performed to verify the patient, the procedure, and the extremity.  Preoperative antibiotics were dosed.  I obtained fluoroscopic imaging to show the unstable nature of the nonunion.  There was hyperextension and varus deformity.  I made a direct medial approach to the proximal phalanx from the metatarsal phalangeal joint to the interphalangeal joint.  I took care  to protect the neurovascular bundle.  I performed subperiosteal  dissection to expose the medial cortex and proceeded to debride the nonunion site I used a curette and a rongeur.  I was able to get the nonunion back to healthy appearing bone and decorticated it with a 1.1 mm K wire.  I then irrigated the nonunion site out.  There is no signs of infection.  Once I had the nonunion site debrided I then turned my attention to harvesting the iliac crest bone marrow aspirate.  Using the [employer] bone marrow aspirate set I percutaneously placed a harvesting device in her iliac crest approximately 4 cm proximal to the ASIS.  Once I was in the pelvis I then aspirated 25 cc of bone marrow aspirate.  I combined this with the anticoagulant.  We then passed this off to be get spun down into the concentrate.  This took 15 minutes and while I was awaiting for the concentrate to be processed I turned my attention to the plating portion of the nonunion.  I used a 1.5 mm [employer] T plate that I positioned appropriately on the medial cortex and I confirmed reduction and held it provisionally with 1.1 mm K wires.  I confirmed placement of the plate and then placed a nonlocking screw into the base of the phalanx and a nonlocking screw into the distal portion of the phalanx.  This brought the plate flush to bone and aligned the nonunion appropriately.  I then placed locking screws in the proximal and distal segment of the nonunion.  Excellent fixation was obtained.  Fluoroscopic imaging was obtained to show all screws were in correct position and reduction was obtained.  I then mixed the bone marrow aspirate concentrate with DBM matrix putty.  I then used this as a vehicle to deliver the bone marrow aspirate concentrate.  He was placed in the nonunion site and I was able to place almost 1 mL of DBM putty.  I had irrigated the nonunion prior to placing the graft.  I then obtained final fluoroscopic imaging.  The wound was closed with 3-0 nylon.  A sterile dressing consisting of  Mepitel, 4 x 4's and Kling was used.  The patient was then awoken from anesthesia and taken the PACU in stable condition.  Post Op Plan/Instructions: Patient will be weightbearing as tolerated through the heel.  She will receive no DVT prophylaxis as he is an ambulatory patient.  She will be discharged home from the PACU and follow-up in 2 weeks for suture removal and x-rays.  I was present and performed the entire surgery.  Patrecia Pace, PA-C did assist me throughout the case. An assistant was necessary given the difficulty in approach, maintenance of reduction and ability to instrument the fracture.   Katha Hamming, MD [facility]

## 2020-04-03 NOTE — Anesthesia Procedure Notes (Signed)
Procedure Name: LMA Insertion Date/Time: 04/03/2020 8:42 AM Performed by: Amadeo Garnet, CRNA Pre-anesthesia Checklist: Patient identified, Emergency Drugs available, Suction available and Patient being monitored Patient Re-evaluated:Patient Re-evaluated prior to induction Oxygen Delivery Method: Circle system utilized Preoxygenation: Pre-oxygenation with 100% oxygen Induction Type: IV induction Ventilation: Mask ventilation without difficulty LMA: LMA inserted LMA Size: 4.0 Placement Confirmation: positive ETCO2 and breath sounds checked- equal and bilateral Tube secured with: Tape Dental Injury: Teeth and Oropharynx as per pre-operative assessment

## 2020-04-03 NOTE — Interval H&P Note (Signed)
History and Physical Interval Note:  04/03/2020 8:32 AM  Lindsey Sampson  has presented today for surgery, with the diagnosis of Right great toe nonunion.  The various methods of treatment have been discussed with the patient and family. After consideration of risks, benefits and other options for treatment, the patient has consented to  Procedure(s): REPAIR OF RIGHT GREAT TOE NONUNION (Right) as a surgical intervention.  The patient's history has been reviewed, patient examined, no change in status, stable for surgery.  I have reviewed the patient's chart and labs.  Questions were answered to the patient's satisfaction.     Lennette Bihari P Rebeccah Ivins

## 2020-04-03 NOTE — Anesthesia Procedure Notes (Signed)
Anesthesia Regional Block: Popliteal block   Pre-Anesthetic Checklist: ,, timeout performed, Correct Patient, Correct Site, Correct Laterality, Correct Procedure, Correct Position, site marked, Risks and benefits discussed, pre-op evaluation,  At surgeon's request and post-op pain management  Laterality: Right  Prep: Maximum Sterile Barrier Precautions used, chloraprep       Needles:  Injection technique: Single-shot  Needle Type: Echogenic Stimulator Needle     Needle Length: 9cm  Needle Gauge: 21     Additional Needles:   Procedures:,,,, ultrasound used (permanent image in chart),,,,  Narrative:  Start time: 04/03/2020 7:45 AM End time: 04/03/2020 7:55 AM Injection made incrementally with aspirations every 5 mL. Anesthesiologist: Roderic Palau, MD  Additional Notes: 2% Lidocaine skin wheel.

## 2020-04-03 NOTE — Discharge Instructions (Addendum)
[facility] Discharge Instructions   General Discharge Instructions  WEIGHT BEARING STATUS: weightbearing as tolerated through right heel, non-weightbearing through forefoot/toe  RANGE OF MOTION/ACTIVITY: Okay for gentle toe motion as tolerated  Wound Care: You may remove surgical dressing on post op day #2 (Friday 04/05/20). Incisions can be left open to air if there is no drainage. If incision continues to have drainage, follow wound care instructions below. Okay to shower if no drainage from incisions.  DVT/PE prophylaxis: None  Diet: as you were eating previously.  Can use over the counter stool softeners and bowel preparations, such as Miralax, to help with bowel movements.  Narcotics can be constipating.  Be sure to drink plenty of fluids  PAIN MEDICATION USE AND EXPECTATIONS  You have likely been given narcotic medications to help control your pain.  After a traumatic event that results in an fracture (broken bone) with or without surgery, it is ok to use narcotic pain medications to help control one's pain.  We understand that everyone responds to pain differently and each individual patient will be evaluated on a regular basis for the continued need for narcotic medications. Ideally, narcotic medication use should last no more than 6-8 weeks (coinciding with fracture healing).   As a patient it is your responsibility as well to monitor narcotic medication use and report the amount and frequency you use these medications when you come to your office visit.   We would also advise that if you are using narcotic medications, you should take a dose prior to therapy to maximize you participation.  IF YOU ARE ON NARCOTIC MEDICATIONS IT IS NOT PERMISSIBLE TO OPERATE A MOTOR VEHICLE (MOTORCYCLE/CAR/TRUCK/MOPED) OR HEAVY MACHINERY DO NOT MIX NARCOTICS WITH OTHER CNS (CENTRAL NERVOUS SYSTEM) DEPRESSANTS SUCH AS ALCOHOL   STOP SMOKING OR USING NICOTINE PRODUCTS!!!!  As  discussed nicotine severely impairs your body's ability to heal surgical and traumatic wounds but also impairs bone healing.  Wounds and bone heal by forming microscopic blood vessels (angiogenesis) and nicotine is a vasoconstrictor (essentially, shrinks blood vessels).  Therefore, if vasoconstriction occurs to these microscopic blood vessels they essentially disappear and are unable to deliver necessary nutrients to the healing tissue.  This is one modifiable factor that you can do to dramatically increase your chances of healing your injury.    (This means no smoking, no nicotine gum, patches, etc)  DO NOT USE NONSTEROIDAL ANTI-INFLAMMATORY DRUGS (NSAID'S)  Using products such as Advil (ibuprofen), Aleve (naproxen), Motrin (ibuprofen) for additional pain control during fracture healing can delay and/or prevent the healing response.  If you would like to take over the counter (OTC) medication, Tylenol (acetaminophen) is ok.  However, some narcotic medications that are given for pain control contain acetaminophen as well. Therefore, you should not exceed more than 4000 mg of tylenol in a day if you do not have liver disease.  Also note that there are may OTC medicines, such as cold medicines and allergy medicines that my contain tylenol as well.  If you have any questions about medications and/or interactions please ask your doctor/PA or your pharmacist.      ICE AND ELEVATE INJURED/OPERATIVE EXTREMITY  Using ice and elevating the injured extremity above your heart can help with swelling and pain control.  Icing in a pulsatile fashion, such as 20 minutes on and 20 minutes off, can be followed.    Do not place ice directly on skin. Make sure there is a barrier between to skin and  the ice pack.    Using frozen items such as frozen peas works well as the conform nicely to the are that needs to be iced.  USE AN ACE WRAP OR TED HOSE FOR SWELLING CONTROL  In addition to icing and elevation, Ace wraps or TED  hose are used to help limit and resolve swelling.  It is recommended to use Ace wraps or TED hose until you are informed to stop.    When using Ace Wraps start the wrapping distally (farthest away from the body) and wrap proximally (closer to the body)   Example: If you had surgery on your leg or thing and you do not have a splint on, start the ace wrap at the toes and work your way up to the thigh        If you had surgery on your upper extremity and do not have a splint on, start the ace wrap at your fingers and work your way up to the upper arm   [place]: [phone]   VISIT OUR WEBSITE FOR ADDITIONAL INFORMATION: [email or web address]      Discharge Wound Care Instructions  Do NOT apply any ointments, solutions or lotions to pin sites or surgical wounds.  These prevent needed drainage and even though solutions like hydrogen peroxide kill bacteria, they also damage cells lining the pin sites that help fight infection.  Applying lotions or ointments can keep the wounds moist and can cause them to breakdown and open up as well. This can increase the risk for infection. When in doubt call the office.  Surgical incisions should be dressed daily.  If any drainage is noted, use one layer of adaptic, then gauze, Kerlix, and an ace wrap.  Once the incision is completely dry and without drainage, it may be left open to air out.  Showering may begin 36-48 hours later.  Cleaning gently with soap and water.  Traumatic wounds should be dressed daily as well.    One layer of adaptic, gauze, Kerlix, then ace wrap.  The adaptic can be discontinued once the draining has ceased    If you have a wet to dry dressing: wet the gauze with saline the squeeze as much saline out so the gauze is moist (not soaking wet), place moistened gauze over wound, then place a dry gauze over the moist one, followed by Kerlix wrap, then ace wrap.

## 2020-04-03 NOTE — Transfer of Care (Signed)
Immediate Anesthesia Transfer of Care Note  Patient: Lindsey Sampson  Procedure(s) Performed: REPAIR OF RIGHT GREAT TOE NONUNION  WITH BONE GRAFTING RIGHT HIP (Right Toe)  Patient Location: PACU  Anesthesia Type:GA combined with regional for post-op pain  Level of Consciousness: awake, alert  and oriented  Airway & Oxygen Therapy: Patient Spontanous Breathing  Post-op Assessment: Report given to RN, Post -op Vital signs reviewed and stable and Patient moving all extremities  Post vital signs: Reviewed and stable  Last Vitals:  Vitals Value Taken Time  BP    Temp    Pulse 82 04/03/20 1043  Resp 22 04/03/20 1043  SpO2 98 % 04/03/20 1043  Vitals shown include unvalidated device data.  Last Pain:  Vitals:   04/03/20 0727  TempSrc:   PainSc: 6       Patients Stated Pain Goal: 2 (59/27/63 9432)  Complications: No complications documented.

## 2020-04-03 NOTE — Anesthesia Postprocedure Evaluation (Signed)
Anesthesia Post Note  Patient: Lindsey Sampson  Procedure(s) Performed: REPAIR OF RIGHT GREAT TOE NONUNION  WITH BONE GRAFTING RIGHT HIP (Right Toe)     Patient location during evaluation: PACU Anesthesia Type: General and Regional Level of consciousness: awake and alert Pain management: pain level controlled Vital Signs Assessment: post-procedure vital signs reviewed and stable Respiratory status: spontaneous breathing, nonlabored ventilation and respiratory function stable Cardiovascular status: blood pressure returned to baseline and stable Postop Assessment: no apparent nausea or vomiting Anesthetic complications: no   No complications documented.  Last Vitals:  Vitals:   04/03/20 1105 04/03/20 1110  BP: (!) 105/56 107/70  Pulse: 77 73  Resp: (!) 30 13  Temp:  (!) 36.1 C  SpO2: 98% 100%    Last Pain:  Vitals:   04/03/20 1110  TempSrc:   PainSc: 0-No pain                 Ayeisha Lindenberger,W. EDMOND

## 2020-04-04 ENCOUNTER — Encounter (HOSPITAL_COMMUNITY): Payer: Self-pay | Admitting: Student

## 2020-05-16 ENCOUNTER — Other Ambulatory Visit: Payer: Self-pay | Admitting: General Practice

## 2020-05-16 DIAGNOSIS — Z1231 Encounter for screening mammogram for malignant neoplasm of breast: Secondary | ICD-10-CM

## 2020-05-27 ENCOUNTER — Other Ambulatory Visit: Payer: Self-pay | Admitting: Sports Medicine

## 2020-05-27 DIAGNOSIS — Z1231 Encounter for screening mammogram for malignant neoplasm of breast: Secondary | ICD-10-CM

## 2020-05-30 ENCOUNTER — Other Ambulatory Visit: Payer: 59

## 2020-06-19 ENCOUNTER — Other Ambulatory Visit: Payer: Self-pay | Admitting: Sports Medicine

## 2020-06-19 DIAGNOSIS — Z1231 Encounter for screening mammogram for malignant neoplasm of breast: Secondary | ICD-10-CM

## 2020-07-02 ENCOUNTER — Other Ambulatory Visit: Payer: Self-pay

## 2020-07-02 ENCOUNTER — Ambulatory Visit
Admission: RE | Admit: 2020-07-02 | Discharge: 2020-07-02 | Disposition: A | Discharge status: Home / Self Care | Payer: 59 | Source: Ambulatory Visit

## 2020-07-02 DIAGNOSIS — Z1231 Encounter for screening mammogram for malignant neoplasm of breast: Secondary | ICD-10-CM

## 2020-09-02 ENCOUNTER — Ambulatory Visit
Admission: EM | Admit: 2020-09-02 | Discharge: 2020-09-02 | Disposition: A | Discharge status: Home / Self Care | Payer: 59 | Attending: Family Medicine | Admitting: Family Medicine

## 2020-09-02 ENCOUNTER — Other Ambulatory Visit: Payer: Self-pay

## 2020-09-02 ENCOUNTER — Encounter: Payer: Self-pay | Admitting: Emergency Medicine

## 2020-09-02 ENCOUNTER — Ambulatory Visit (INDEPENDENT_AMBULATORY_CARE_PROVIDER_SITE_OTHER): Payer: 59

## 2020-09-02 DIAGNOSIS — R0789 Other chest pain: Secondary | ICD-10-CM | POA: Diagnosis not present

## 2020-09-02 DIAGNOSIS — R079 Chest pain, unspecified: Secondary | ICD-10-CM | POA: Diagnosis not present

## 2020-09-02 DIAGNOSIS — R519 Headache, unspecified: Secondary | ICD-10-CM

## 2020-09-02 MED ORDER — NAPROXEN 375 MG PO TABS
375.0000 mg | ORAL_TABLET | Freq: Two times a day (BID) | ORAL | 0 refills | Status: DC
Start: 2020-09-02 — End: 2021-10-09

## 2020-09-02 MED ORDER — CYCLOBENZAPRINE HCL 5 MG PO TABS: 5.0000 mg | ORAL_TABLET | Freq: Every day | ORAL | 0 refills | Status: DC

## 2020-09-02 MED ORDER — KETOROLAC TROMETHAMINE 60 MG/2ML IM SOLN
60.0000 mg | Freq: Once | INTRAMUSCULAR | Status: AC
  Administered 2020-09-02: 60 mg via INTRAMUSCULAR
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
## 2021-01-27 ENCOUNTER — Other Ambulatory Visit: Payer: Self-pay

## 2021-01-27 ENCOUNTER — Ambulatory Visit
Admission: EM | Admit: 2021-01-27 | Discharge: 2021-01-27 | Disposition: A | Discharge status: Home / Self Care | Payer: 59 | Attending: Family Medicine | Admitting: Family Medicine

## 2021-01-27 DIAGNOSIS — R5383 Other fatigue: Secondary | ICD-10-CM

## 2021-01-27 DIAGNOSIS — A084 Viral intestinal infection, unspecified: Secondary | ICD-10-CM | POA: Diagnosis not present

## 2021-01-27 MED ORDER — METOCLOPRAMIDE HCL 10 MG PO TABS
10.0000 mg | ORAL_TABLET | Freq: Four times a day (QID) | ORAL | 0 refills | Status: DC | PRN
Start: 2021-01-27 — End: 2022-05-08

## 2021-01-27 MED ORDER — ONDANSETRON 8 MG PO TBDP
8.0000 mg | ORAL_TABLET | Freq: Three times a day (TID) | ORAL | 0 refills | Status: DC | PRN
Start: 2021-01-27 — End: 2021-01-27

## 2021-01-27 MED ORDER — ONDANSETRON 8 MG PO TBDP
8.0000 mg | ORAL_TABLET | Freq: Three times a day (TID) | ORAL | 0 refills | Status: DC | PRN
Start: 2021-01-27 — End: 2021-10-14

## 2021-01-27 MED ORDER — DICYCLOMINE HCL 20 MG PO TABS
20.0000 mg | ORAL_TABLET | Freq: Two times a day (BID) | ORAL | 0 refills | Status: DC
Start: 2021-01-27 — End: 2021-10-14

## 2021-01-27 NOTE — ED Provider Notes (Signed)
[facility]    CSN: [id number] Arrival date & time: 01/27/21  1752      History   Chief Complaint Chief Complaint  Patient presents with  . Nausea  . Diarrhea    HPI Lindsey Sampson is a 40 y.o. female.   HPI  Patient presents with GI symptoms of diarrhea and nausea x 3 days. Afebrile. No abdominal pain, endorses cramping (generalized). Tolerated of fluids, nausea precipitates  nausea with vomiting.  No known sick contacts.  Stool consistency is that of water. Urinating as normal. Past Medical History:  Diagnosis Date  . Anxiety   . Colitis   . Cyst of spleen   . Depression   . Ovarian cyst   . Renal disorder    renal cyst,     Patient Active Problem List   Diagnosis Date Noted  . Open displaced fracture of proximal phalanx of right great toe 02/20/2020  . Great toe pain, right 09/07/2019  . Open displaced fracture of proximal phalanx of great toe 09/06/2019  . Nausea with vomiting 07/26/2019  . Diarrhea 07/26/2019  . Early satiety 07/26/2019  . Liver lesion 07/26/2019  . H/O tubal ligation 10/14/2018  . 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 BACK SURGERY    . BIOPSY  11/10/2019   Procedure: BIOPSY;  Surgeon: Danie Binder, MD;  [facility];  Service: Endoscopy;;  . CESAREAN SECTION    . CESAREAN SECTION  2006 and 2010  . cyst removed from ovary Right   . ESOPHAGOGASTRODUODENOSCOPY N/A 11/10/2019   Procedure: ESOPHAGOGASTRODUODENOSCOPY (EGD);  Surgeon: Danie Binder, MD;  [facility];  Service: Endoscopy;  Laterality: N/A;  12:00pm  . LUMBAR LAMINECTOMY/DECOMPRESSION MICRODISCECTOMY Left 05/14/2016   Procedure: LUMBAR DECOMPRESSION/MICRODISCECTOMY LEFT LUMBAR FOUR-FIVE;  Surgeon: Melina Schools, MD;  Location: [place];  Service: Orthopedics;  Laterality: Left;  . ORIF TOE FRACTURE Right 09/07/2019   Procedure: IRRIGATION AND DEBRIDEMENT AND OPEN REDUCTION INTERNAL FIXATION  (ORIF) Proximal phalanx FRACTURE;  Surgeon: Shona Needles, MD;  Location: [place];  Service: Orthopedics;  Laterality: Right;  . ORIF TOE FRACTURE Right 04/03/2020   Procedure: REPAIR OF RIGHT GREAT TOE NONUNION  WITH BONE GRAFTING RIGHT HIP;  Surgeon: Shona Needles, MD;  Location: [place];  Service: Orthopedics;  Laterality: Right;  . TUBAL LIGATION      OB History    Gravida  2   Para      Term      Preterm      AB      Living  2     SAB      IAB      Ectopic      Multiple  0   Live Births  2            Home Medications    Prior to Admission medications   Medication Sig Start Date End Date Taking? Authorizing Provider  dicyclomine (BENTYL) 20 MG tablet Take 1 tablet (20 mg total) by mouth 2 (two) times daily. 01/27/21  Yes Scot Jun, FNP  metoCLOPramide (REGLAN) 10 MG tablet Take 1 tablet (10 mg total) by mouth every 6 (six) hours as needed for nausea. 01/27/21  Yes Scot Jun, FNP  acetaminophen (TYLENOL) 500 MG tablet Take 500 mg by mouth every 6 (six) hours as needed for mild pain.    [provider]  buPROPion (ZYBAN) 150  MG 12 hr tablet Take 150 mg by mouth 2 (two) times daily. 10/03/19   [provider]  cyclobenzaprine (FLEXERIL) 5 MG tablet Take 1-2 tablets (5-10 mg total) by mouth at bedtime. 09/02/20   Scot Jun, FNP  HYDROcodone-acetaminophen (NORCO/VICODIN) 5-325 MG tablet Take 1 tablet by mouth every 6 (six) hours as needed for severe pain. 04/03/20   Delray Alt, PA-C  naproxen (NAPROSYN) 375 MG tablet Take 1 tablet (375 mg total) by mouth 2 (two) times daily. 09/02/20   Scot Jun, FNP  ondansetron (ZOFRAN) 8 MG tablet Take 8 mg by mouth 3 (three) times daily as needed for nausea/vomiting. 09/08/19   [provider]  ondansetron (ZOFRAN-ODT) 8 MG disintegrating tablet Take 1 tablet (8 mg total) by mouth every 8 (eight) hours as needed for nausea. 01/27/21   Scot Jun, FNP  pantoprazole  (PROTONIX) 40 MG tablet 1 PO 30 MINUTES PRIOR TO MEALS BID FOR 1 MO THEN QD FOR 2 MOS Patient taking differently: Take 40 mg by mouth daily as needed (acid reflux).  11/10/19   Fields, Marga Melnick, MD  clonazePAM (KLONOPIN) 0.5 MG tablet Take 0.5 mg by mouth 3 (three) times daily as needed. For anxiety/sleep. 03/12/16 03/29/19  [provider]    Family History Family History  Problem Relation Age of Onset  . Diabetes Mother   . Hypertension Mother   . High Cholesterol Mother   . Hypertension Father   . Prostate cancer Father   . High Cholesterol Father   . Colon polyps Maternal Grandmother   . Colon cancer Neg Hx     Social History Social History   Tobacco Use  . Smoking status: Light Tobacco Smoker    Packs/day: 0.50    Types: Cigarettes  . Smokeless tobacco: Never Used  . Tobacco comment: trying to quit   Vaping Use  . Vaping Use: Former  Substance Use Topics  . Alcohol use: Yes    Comment: rare  . Drug use: No     Allergies   Prednisone   Review of Systems Review of Systems Pertinent negatives listed in HPI  Physical Exam Triage Vital Signs ED Triage Vitals  Enc Vitals Group     BP 01/27/21 1830 113/74     Pulse Rate 01/27/21 1830 75     Resp 01/27/21 1830 18     Temp 01/27/21 1830 98.3 F (36.8 C)     Temp Source 01/27/21 1830 Oral     SpO2 01/27/21 1830 97 %     Weight --      Height --      Head Circumference --      Peak Flow --      Pain Score 01/27/21 1836 3     Pain Loc --      Pain Edu? --      Excl. in [place]? --    No data found.  Updated Vital Signs BP 113/74   Pulse 75   Temp 98.3 F (36.8 C) (Oral)   Resp 18   LMP 01/13/2021   SpO2 97%   Visual Acuity Right Eye Distance:   Left Eye Distance:   Bilateral Distance:    Right Eye Near:   Left Eye Near:    Bilateral Near:     Physical Exam General appearance: alert, well developed, well nourished, cooperative Head: Normocephalic, without obvious abnormality,  atraumatic Respiratory: Respirations even and unlabored, normal respiratory rate Heart: rate and rhythm normal.  No gallop or murmurs noted on exam  Abdomen: BS +, no distention, no rebound tenderness, or no mass Extremities: No gross deformities Skin: Skin color, texture, turgor normal. No rashes seen  Psych: Appropriate mood and affect. Neurologic: GCS 15, normal coordination normal gait UC Treatments / Results  Labs (all labs ordered are listed, but only abnormal results are displayed) Labs Reviewed - No data to display  EKG   Radiology No results found.  Procedures Procedures (including critical care time)  Medications Ordered in UC Medications - No data to display  Initial Impression / Assessment and Plan / UC Course  I have reviewed the triage vital signs and the nursing notes.  Pertinent labs & imaging results that were available during my care of the patient were reviewed by me and considered in my medical decision making (see chart for details).    Viral gastroenteritis, management of symptoms.  Advance diet as tolerated. Treatment per discharge medication orders. Final Clinical Impressions(s) / UC Diagnoses   Final diagnoses:  Viral gastroenteritis  Other fatigue   Discharge Instructions   None    ED Prescriptions    Medication Sig Dispense Auth. Provider   ondansetron (ZOFRAN-ODT) 8 MG disintegrating tablet  (Status: Discontinued) Take 1 tablet (8 mg total) by mouth every 8 (eight) hours as needed for nausea. 30 tablet Scot Jun, FNP   metoCLOPramide (REGLAN) 10 MG tablet Take 1 tablet (10 mg total) by mouth every 6 (six) hours as needed for nausea. 20 tablet Scot Jun, FNP   dicyclomine (BENTYL) 20 MG tablet Take 1 tablet (20 mg total) by mouth 2 (two) times daily. 20 tablet Scot Jun, FNP   ondansetron (ZOFRAN-ODT) 8 MG disintegrating tablet Take 1 tablet (8 mg total) by mouth every 8 (eight) hours as needed for nausea. 30 tablet  Scot Jun, FNP     PDMP not reviewed this encounter.   Scot Jun, FNP 02/02/21 1034

## 2021-01-27 NOTE — ED Triage Notes (Signed)
Pt presents with c/o diarrhea and nausea since Friday

## 2021-04-29 ENCOUNTER — Ambulatory Visit: Payer: 59 | Admitting: Podiatry

## 2021-04-30 ENCOUNTER — Ambulatory Visit: Payer: 59 | Admitting: Podiatry

## 2021-05-12 ENCOUNTER — Encounter (HOSPITAL_COMMUNITY): Payer: Self-pay

## 2021-05-12 ENCOUNTER — Emergency Department (HOSPITAL_COMMUNITY)
Admission: EM | Admit: 2021-05-12 | Discharge: 2021-05-13 | Disposition: A | Discharge status: Home / Self Care | Payer: 59 | Attending: Emergency Medicine | Admitting: Emergency Medicine

## 2021-05-12 ENCOUNTER — Other Ambulatory Visit: Payer: Self-pay

## 2021-05-12 DIAGNOSIS — W268XXA Contact with other sharp object(s), not elsewhere classified, initial encounter: Secondary | ICD-10-CM | POA: Insufficient documentation

## 2021-05-12 DIAGNOSIS — F17210 Nicotine dependence, cigarettes, uncomplicated: Secondary | ICD-10-CM | POA: Diagnosis not present

## 2021-05-12 DIAGNOSIS — S81811A Laceration without foreign body, right lower leg, initial encounter: Secondary | ICD-10-CM | POA: Diagnosis not present

## 2021-05-12 DIAGNOSIS — I97630 Postprocedural hematoma of a circulatory system organ or structure following a cardiac catheterization: Secondary | ICD-10-CM | POA: Diagnosis not present

## 2021-05-12 NOTE — ED Triage Notes (Signed)
Pt. Arrived from home. Pt. Was attempting to cut a wire and slipped and cut her leg with a razor blade. While in the lobby pts. Dressing became lose and pt. Started to bleed. This nurse went out and redressed pts. Leg.

## 2021-05-12 NOTE — ED Provider Notes (Signed)
Emergency Medicine Provider Triage Evaluation Note  Lindsey Sampson , a 40 y.o. female  was evaluated in triage.  Pt complains of laceration to right distal femur. Occurred just PTA. Unknown last tetanus. Ozzing of blood. No anticoagulation.   Review of Systems  Positive: Right thigh laceration Negative: lightheadedness  Physical Exam  BP 100/62   Pulse 87   Temp 98.4 F (36.9 C) (Oral)   Resp 17   Ht '5\' 4"'$  (1.626 m)   Wt 78 kg   SpO2 95%   BMI 29.52 kg/m  Gen:   Awake, no distress   Resp:  Normal effort  MSK:   Moves extremities without difficulty. Laceration to anterior right thigh approximately 7 cm, gapping, slow oozing of blood. No pulsatile bleeding Other:    Medical Decision Making  Medically screening exam initiated at 9:42 PM.  Appropriate orders placed.  Lindsey Sampson was informed that the remainder of the evaluation will be completed by another provider, this initial triage assessment does not replace that evaluation, and the importance of remaining in the ED until their evaluation is complete.  Laceration  Hemodynamically stable.  No pulsatile bleeding to suggest arterial bleed.  She is neurovascularly intact.  Pressure dressing placed.  Nursing notified patient needs next room in back.   Rishaan Gunner A, PA-C 05/12/21 2145    Orpah Greek, MD 05/13/21 [phone]

## 2021-05-13 DIAGNOSIS — S81811A Laceration without foreign body, right lower leg, initial encounter: Secondary | ICD-10-CM | POA: Diagnosis not present

## 2021-05-13 MED ORDER — LIDOCAINE-EPINEPHRINE (PF) 1 %-1:200000 IJ SOLN
10.0000 mL | Freq: Once | INTRAMUSCULAR | Status: AC
  Administered 2021-05-13: 10 mL
  Filled 2021-05-13: qty 30

## 2021-05-13 MED ORDER — HYDROCODONE-ACETAMINOPHEN 5-325 MG PO TABS: 1.0000 | ORAL_TABLET | ORAL | 0 refills | Status: DC | PRN

## 2021-05-13 NOTE — ED Provider Notes (Signed)
[facility] Provider Note   CSN: [id number] Arrival date & time: 05/12/21  2041     History Chief Complaint  Patient presents with   Extremity Laceration    Lindsey Sampson is a 40 y.o. female.  Patient presents with right leg laceration.  Patient reports that she was using a box cutter and accidentally cut just above her right knee.  Patient reports moderate to severe pain.  There was a large amount of bleeding initially which is now stopped with direct pressure.  No numbness or tingling.      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    Past Surgical History:  Procedure Laterality Date   BACK SURGERY     BIOPSY  11/10/2019   Procedure: BIOPSY;  Surgeon: Danie Binder, MD;  [facility];  Service: Endoscopy;;   [place]  2006 and 2010   cyst removed from ovary Right    ESOPHAGOGASTRODUODENOSCOPY N/A 11/10/2019   Procedure: ESOPHAGOGASTRODUODENOSCOPY (EGD);  Surgeon: Danie Binder, MD;  [facility];  Service: Endoscopy;  Laterality: N/A;  12:00pm   LUMBAR LAMINECTOMY/DECOMPRESSION MICRODISCECTOMY Left 05/14/2016   Procedure: LUMBAR DECOMPRESSION/MICRODISCECTOMY LEFT LUMBAR FOUR-FIVE;  Surgeon: Melina Schools, MD;  Location: [place];  Service: Orthopedics;  Laterality: Left;   ORIF TOE FRACTURE Right 09/07/2019   Procedure: IRRIGATION AND DEBRIDEMENT AND OPEN REDUCTION INTERNAL FIXATION  (ORIF) Proximal phalanx FRACTURE;  Surgeon: Shona Needles, MD;  Location: [place];  Service: Orthopedics;  Laterality: Right;   ORIF TOE FRACTURE Right 04/03/2020   Procedure: REPAIR OF RIGHT GREAT TOE NONUNION  WITH BONE GRAFTING RIGHT HIP;  Surgeon: Shona Needles, MD;  Location: [place];  Service: Orthopedics;  Laterality: Right;   TUBAL LIGATION       OB History     Gravida  2   Para      Term      Preterm      AB      Living  2      SAB      IAB      Ectopic      Multiple  0   Live Births  2           Family History  Problem Relation Age of Onset   Diabetes Mother    Hypertension Mother    High Cholesterol Mother    Hypertension Father    Prostate cancer Father    High Cholesterol Father    Colon polyps Maternal Grandmother    Colon cancer Neg Hx     Social History   Tobacco Use   Smoking status: Light Smoker    Packs/day: 0.50    Types: Cigarettes   Smokeless tobacco: Never   Tobacco comments:    trying to quit  Vaping Use   Vaping Use: Former  Substance Use Topics   Alcohol use: Yes    Comment: rare   Drug use: No    Home Medications Prior to Admission medications   Medication Sig Start Date End Date Taking? Authorizing Provider  HYDROcodone-acetaminophen (NORCO/VICODIN) 5-325 MG tablet Take 1-2 tablets by mouth every 4 (four) hours as needed. 05/13/21  Yes Danee Soller, Gwenyth Allegra, MD  acetaminophen (TYLENOL) 500 MG tablet Take 500 mg by mouth every 6 (six) hours as needed for mild pain.    [provider]  buPROPion (ZYBAN) 150 MG 12 hr tablet Take 150 mg by mouth 2 (two) times daily. 10/03/19   [provider]  cyclobenzaprine (FLEXERIL) 5 MG tablet Take 1-2 tablets (5-10 mg total) by mouth at bedtime. 09/02/20   Scot Jun, FNP  dicyclomine (BENTYL) 20 MG tablet Take 1 tablet (20 mg total) by mouth 2 (two) times daily. 01/27/21   Scot Jun, FNP  metoCLOPramide (REGLAN) 10 MG tablet Take 1 tablet (10 mg  total) by mouth every 6 (six) hours as needed for nausea. 01/27/21   Scot Jun, FNP  naproxen (NAPROSYN) 375 MG tablet Take 1 tablet (375 mg total) by mouth 2 (two) times daily. 09/02/20   Scot Jun, FNP  ondansetron (ZOFRAN) 8 MG tablet Take 8 mg by mouth 3 (three) times daily as needed for nausea/vomiting. 09/08/19   [provider]  ondansetron (ZOFRAN-ODT) 8 MG disintegrating tablet Take 1 tablet (8 mg total) by mouth every 8 (eight) hours as needed for nausea. 01/27/21   Scot Jun, FNP  pantoprazole (PROTONIX) 40 MG tablet 1 PO 30 MINUTES PRIOR TO MEALS BID FOR 1 MO THEN QD FOR 2 MOS Patient taking differently: Take 40 mg by mouth daily as needed (acid reflux).  11/10/19   Fields, Marga Melnick, MD  clonazePAM (KLONOPIN) 0.5 MG tablet Take 0.5 mg by mouth 3 (three) times daily as needed. For anxiety/sleep. 03/12/16 03/29/19  [provider]    Allergies    Prednisone  Review of Systems   Review of Systems  Skin:  Positive for wound.  All other systems reviewed and are negative.  Physical Exam Updated Vital Signs BP 100/62   Pulse 87   Temp 98.4 F (36.9 C) (Oral)   Resp 17   Ht '5\' 4"'$  (1.626 m)   Wt 78 kg   SpO2 95%   BMI 29.52 kg/m   Physical Exam Vitals and nursing note reviewed.  Constitutional:      Appearance: Normal appearance.  HENT:     Head: Atraumatic.  Musculoskeletal:        General: Normal range of motion.  Skin:    Findings: Laceration (medial distal femur region) present.  Neurological:     Mental Status: She is alert.     Sensory: Sensation is intact.     Motor: Motor function is intact.    ED Results / Procedures / Treatments   Labs (all labs ordered are listed, but only abnormal results are displayed) Labs Reviewed - No data to display  EKG None  Radiology No results found.  Procedures .Marland KitchenLaceration Repair  Date/Time: 05/13/2021 12:56 AM Performed by: Orpah Greek, MD Authorized by: Orpah Greek, MD   Consent:    Consent obtained:  Verbal   Consent given by:  Patient   Risks, benefits, and alternatives were discussed: yes     Risks discussed:  Infection, pain and poor cosmetic  result Universal protocol:    Procedure explained and questions answered to patient or proxy's satisfaction: yes     Site/side marked: yes     Immediately prior to procedure, a time out was called: yes     Patient identity confirmed:  Verbally with patient Anesthesia:    Anesthesia method:  Local infiltration   Local anesthetic:  Lidocaine 1% WITH epi Laceration details:    Location:  Leg   Leg location:  R upper leg   Length (cm):  4.5 Pre-procedure details:    Preparation:  Patient was prepped and draped in usual sterile fashion Exploration:    Hemostasis achieved with:  Direct pressure   Wound exploration: wound explored through full range of motion     Wound extent: no muscle damage noted, no nerve damage noted, no tendon damage noted, no underlying fracture noted and no vascular damage noted     Contaminated: no   Treatment:    Area cleansed with:  Povidone-iodine   Amount of cleaning:  Standard   Irrigation solution:  Sterile saline   Irrigation volume:  500   Irrigation method:  Syringe   Debridement:  None   Layers/structures repaired:  Deep subcutaneous Deep subcutaneous:    Suture size:  3-0   Suture material:  Vicryl   Suture technique:  Horizontal mattress   Number of sutures:  3 Skin repair:    Repair method:  Sutures   Suture size:  3-0   Suture material:  Prolene   Suture technique:  Simple interrupted   Number of sutures:  6 Approximation:    Approximation:  Close Repair type:    Repair type:  Intermediate Post-procedure details:    Dressing:  Non-adherent dressing   Procedure completion:  Tolerated well, no immediate complications   Medications Ordered in ED Medications  lidocaine-EPINEPHrine (XYLOCAINE-EPINEPHrine) 1 %-1:200000 (PF) injection 10 mL  (has no administration in time range)    ED Course  I have reviewed the triage vital signs and the nursing notes.  Pertinent labs & imaging results that were available during my care of the patient were reviewed by me and considered in my medical decision making (see chart for details).    MDM Rules/Calculators/A&P                           Patient with laceration over the medial aspect of the distal femur region of the right leg.  Wound explored, through dermis into subcutaneous fat, no muscle injury.  Cleaned extensively.  Tetanus is up-to-date.  Final Clinical Impression(s) / ED Diagnoses Final diagnoses:  Laceration of right lower extremity, initial encounter    Rx / DC Orders ED Discharge Orders          Ordered    HYDROcodone-acetaminophen (NORCO/VICODIN) 5-325 MG tablet  Every 4 hours PRN        05/13/21 0100             Orpah Greek, MD 05/13/21 0100

## 2021-05-14 ENCOUNTER — Telehealth: Payer: Self-pay

## 2021-05-14 MED FILL — Hydrocodone-Acetaminophen Tab 5-325 MG: ORAL | Qty: 6 | Status: AC

## 2021-05-14 NOTE — Telephone Encounter (Signed)
Transition Care Management Unsuccessful Follow-up Telephone Call  Date of discharge and from where:  05/13/2021-[facility] ED   Attempts:  1st Attempt  Reason for unsuccessful TCM follow-up call:  Unable to reach patient

## 2021-05-15 NOTE — Telephone Encounter (Signed)
Transition Care Management Unsuccessful Follow-up Telephone Call  Date of discharge and from where:  05/13/2021-[facility] ED   Attempts:  2nd Attempt  Reason for unsuccessful TCM follow-up call:  Unable to reach patient

## 2021-05-16 NOTE — Telephone Encounter (Signed)
Transition Care Management Unsuccessful Follow-up Telephone Call  Date of discharge and from where:  05/13/2021-[facility]   Attempts:  3rd Attempt  Reason for unsuccessful TCM follow-up call:  Unable to reach patient

## 2021-10-09 ENCOUNTER — Other Ambulatory Visit: Payer: Self-pay

## 2021-10-09 ENCOUNTER — Ambulatory Visit (INDEPENDENT_AMBULATORY_CARE_PROVIDER_SITE_OTHER): Payer: Medicaid Other

## 2021-10-09 ENCOUNTER — Ambulatory Visit: Payer: Medicaid Other

## 2021-10-09 ENCOUNTER — Ambulatory Visit
Admission: EM | Admit: 2021-10-09 | Discharge: 2021-10-09 | Disposition: A | Discharge status: Home / Self Care | Payer: Medicaid Other

## 2021-10-09 ENCOUNTER — Encounter: Payer: Self-pay | Admitting: Emergency Medicine

## 2021-10-09 DIAGNOSIS — M545 Low back pain, unspecified: Secondary | ICD-10-CM | POA: Diagnosis not present

## 2021-10-09 DIAGNOSIS — M47816 Spondylosis without myelopathy or radiculopathy, lumbar region: Secondary | ICD-10-CM

## 2021-10-09 DIAGNOSIS — M5136 Other intervertebral disc degeneration, lumbar region: Secondary | ICD-10-CM | POA: Diagnosis not present

## 2021-10-09 DIAGNOSIS — M4126 Other idiopathic scoliosis, lumbar region: Secondary | ICD-10-CM | POA: Diagnosis not present

## 2021-10-09 MED ORDER — TIZANIDINE HCL 4 MG PO TABS: 4.0000 mg | ORAL_TABLET | Freq: Every day | ORAL | 0 refills | Status: DC

## 2021-10-09 MED ORDER — METHYLPREDNISOLONE SODIUM SUCC 125 MG IJ SOLR
125.0000 mg | Freq: Once | INTRAMUSCULAR | Status: AC
  Administered 2021-10-09: 125 mg via INTRAMUSCULAR

## 2021-10-09 MED ORDER — NAPROXEN 500 MG PO TABS: 500.0000 mg | ORAL_TABLET | Freq: Two times a day (BID) | ORAL | 0 refills | Status: DC

## 2021-10-09 NOTE — ED Provider Notes (Signed)
[facility]   MRN: [id number] DOB: [birth year]  Subjective:   Lindsey Sampson is a 41 y.o. female presenting for 1 week history of persistent and worsening low back pain along the midline that radiates to the right side.  No radiation into the right lower leg.  No weakness, numbness or tingling, changes to bowel or urinary habits.  No heavy lifting.  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  No traumas, falls.  She does have a remote history of a lumbar disc herniation that was repaired surgically in 2017.  Has not needed follow-up since then as she has not had issues.  No current facility-administered medications for this encounter.  Current Outpatient Medications:    buPROPion (WELLBUTRIN) 75 MG tablet, Take 75 mg by mouth 2 (two) times daily., Disp: , Rfl:    FLUoxetine (PROZAC) 10 MG tablet, Take 10 mg by mouth daily., Disp: , Rfl:    acetaminophen (TYLENOL) 500 MG tablet, Take 500 mg by mouth every 6 (six) hours as needed for mild pain., Disp: , Rfl:    buPROPion (ZYBAN) 150 MG 12 hr tablet, Take 150 mg by mouth 2 (two) times daily., Disp: , Rfl:    cyclobenzaprine (FLEXERIL) 5 MG tablet, Take 1-2 tablets (5-10 mg total) by mouth at bedtime., Disp: 30 tablet, Rfl: 0   dicyclomine (BENTYL) 20 MG tablet, Take 1 tablet (20 mg total) by mouth 2 (two) times daily., Disp: 20 tablet, Rfl: 0   HYDROcodone-acetaminophen (NORCO/VICODIN) 5-325 MG tablet, Take 1-2 tablets by mouth every 4 (four) hours as needed., Disp: 6 tablet, Rfl: 0   metoCLOPramide (REGLAN) 10 MG tablet, Take 1 tablet (10 mg total) by mouth every 6 (six) hours as needed for nausea., Disp: 20 tablet, Rfl: 0   naproxen (NAPROSYN) 375 MG tablet, Take 1 tablet (375 mg total) by mouth 2 (two) times daily., Disp: 20 tablet, Rfl: 0   ondansetron (ZOFRAN) 8 MG tablet, Take 8 mg by mouth 3 (three) times daily as needed for nausea/vomiting., Disp: , Rfl:    ondansetron (ZOFRAN-ODT) 8 MG disintegrating tablet, Take 1 tablet (8  mg total) by mouth every 8 (eight) hours as needed for nausea., Disp: 30 tablet, Rfl: 0   pantoprazole (PROTONIX) 40 MG tablet, 1 PO 30 MINUTES PRIOR TO MEALS BID FOR 1 MO THEN QD FOR 2 MOS (Patient taking differently: Take 40 mg by mouth daily as needed (acid reflux). ), Disp: 60 tablet, Rfl: 11   Allergies  Allergen Reactions   Prednisone Rash    Past Medical History:  Diagnosis Date   Anxiety    Colitis    Cyst of spleen    Depression    Ovarian cyst    Renal disorder    renal cyst,      Past Surgical History:  Procedure Laterality Date   BACK SURGERY     BIOPSY  11/10/2019   Procedure: BIOPSY;  Surgeon: Danie Binder, MD;  [facility];  Service: Endoscopy;;   CESAREAN SECTION     CESAREAN SECTION  2006 and 2010   cyst removed from ovary Right    ESOPHAGOGASTRODUODENOSCOPY N/A 11/10/2019   Procedure: ESOPHAGOGASTRODUODENOSCOPY (EGD);  Surgeon: Danie Binder, MD;  [facility];  Service: Endoscopy;  Laterality: N/A;  12:00pm   LUMBAR LAMINECTOMY/DECOMPRESSION MICRODISCECTOMY Left 05/14/2016   Procedure: LUMBAR DECOMPRESSION/MICRODISCECTOMY LEFT LUMBAR FOUR-FIVE;  Surgeon: Melina Schools, MD;  Location: [place];  Service: Orthopedics;  Laterality: Left;   ORIF TOE FRACTURE  Right 09/07/2019   Procedure: IRRIGATION AND DEBRIDEMENT AND OPEN REDUCTION INTERNAL FIXATION (ORIF) Proximal phalanx FRACTURE;  Surgeon: Shona Needles, MD;  Location: [place];  Service: Orthopedics;  Laterality: Right;   ORIF TOE FRACTURE Right 04/03/2020   Procedure: REPAIR OF RIGHT GREAT TOE NONUNION  WITH BONE GRAFTING RIGHT HIP;  Surgeon: Shona Needles, MD;  Location: [place];  Service: Orthopedics;  Laterality: Right;   TUBAL LIGATION      Family History  Problem Relation Age of Onset   Diabetes Mother    Hypertension Mother    High Cholesterol Mother    Hypertension Father    Prostate cancer Father    High Cholesterol Father    Colon polyps Maternal Grandmother    Colon  cancer Neg Hx     Social History   Tobacco Use   Smoking status: Light Smoker    Packs/day: 0.50    Types: Cigarettes   Smokeless tobacco: Never   Tobacco comments:    trying to quit   Vaping Use   Vaping Use: Former  Substance Use Topics   Alcohol use: Yes    Comment: rare   Drug use: No    ROS   Objective:   Vitals: BP 120/83 (BP Location: Right Arm)    Pulse 85    Temp 98.1 F (36.7 C) (Oral)    Resp 18    LMP 09/29/2021 (Approximate)    SpO2 97%   Physical Exam Constitutional:      General: She is not in acute distress.    Appearance: Normal appearance. She is well-developed. She is not ill-appearing, toxic-appearing or diaphoretic.  HENT:     Head: Normocephalic and atraumatic.     Right Ear: External ear normal.     Left Ear: External ear normal.     Nose: Nose normal.     Mouth/Throat:     Mouth: Mucous membranes are moist.  Eyes:     General: No scleral icterus.    Extraocular Movements: Extraocular movements intact.  Cardiovascular:     Rate and Rhythm: Normal rate.  Pulmonary:     Effort: Pulmonary effort is normal.  Musculoskeletal:     Lumbar back: Spasms and tenderness present. No swelling, edema, deformity, signs of trauma, lacerations or bony tenderness. Normal range of motion. Negative right straight leg raise test and negative left straight leg raise test. No scoliosis.       Back:  Skin:    General: Skin is warm and dry.     Findings: No rash.  Neurological:     General: No focal deficit present.     Mental Status: She is alert and oriented to person, place, and time.     Motor: No weakness.     Coordination: Coordination normal.     Gait: Gait normal.     Deep Tendon Reflexes: Reflexes normal.  Psychiatric:        Mood and Affect: Mood normal.        Behavior: Behavior normal.        Thought Content: Thought content normal.        Judgment: Judgment normal.    DG Lumbar Spine Complete  Result Date: 10/09/2021 CLINICAL DATA:   Right low back pain worsening over the last week. EXAM: LUMBAR SPINE - COMPLETE 4+ VIEW COMPARISON:  05/14/2016 FINDINGS: Substantial loss of intervertebral disc space at L4-5 with lesser loss of disc space at L3-4 at L5-S1, with associated intervertebral spurring at  these levels including posterior intervertebral spurring at L4-5. Alignment unremarkable. Multilevel degenerative facet arthropathy particularly bilaterally at L4-5 and on the left at L3-4. Minimal levoconvex lumbar scoliosis. IMPRESSION: 1. Lower lumbar spondylosis and degenerative disc disease. If pain persists despite conservative therapy, MRI may be warranted for further characterization. Electronically Signed   By: Van Clines M.D.   On: 10/09/2021 09:52     Assessment and Plan :   PDMP not reviewed this encounter.  1. Degenerative disc disease, lumbar   2. Acute right-sided low back pain without sciatica   3. Acute midline low back pain without sciatica   4. Lumbar spondylosis    Patient does not do well with oral prednisone and she gets thrush with it.  Prefers steroid injections.  Recommended Tylenol and a muscle relaxant with naproxen as an outpatient.  Follow-up with a spine specialist for recheck. Counseled patient on potential for adverse effects with medications prescribed/recommended today, ER and return-to-clinic precautions discussed, patient verbalized understanding.    Jaynee Eagles, [place] 10/09/21 [phone]

## 2021-10-09 NOTE — ED Triage Notes (Signed)
Lower right sided back pain.  Pain worse over the past week.  Hurts to stand, bend over. No known injury.  States it feels like a knot in that area.

## 2021-10-14 ENCOUNTER — Other Ambulatory Visit: Payer: Self-pay

## 2021-10-14 ENCOUNTER — Ambulatory Visit
Admission: EM | Admit: 2021-10-14 | Discharge: 2021-10-14 | Disposition: A | Discharge status: Home / Self Care | Payer: Medicaid Other | Attending: Student | Admitting: Student

## 2021-10-14 ENCOUNTER — Encounter: Payer: Self-pay | Admitting: Emergency Medicine

## 2021-10-14 DIAGNOSIS — K30 Functional dyspepsia: Secondary | ICD-10-CM | POA: Diagnosis not present

## 2021-10-14 DIAGNOSIS — Z20822 Contact with and (suspected) exposure to covid-19: Secondary | ICD-10-CM

## 2021-10-14 DIAGNOSIS — U071 COVID-19: Secondary | ICD-10-CM | POA: Diagnosis not present

## 2021-10-14 MED ORDER — OXYMETAZOLINE HCL 0.05 % NA SOLN: 1.0000 | Freq: Two times a day (BID) | NASAL | 0 refills | Status: DC

## 2021-10-14 MED ORDER — PROMETHAZINE-DM 6.25-15 MG/5ML PO SYRP: 2.5000 mL | ORAL_SOLUTION | Freq: Four times a day (QID) | ORAL | 0 refills | Status: DC | PRN

## 2021-10-14 MED ORDER — ONDANSETRON 8 MG PO TBDP: 8.0000 mg | ORAL_TABLET | Freq: Three times a day (TID) | ORAL | 0 refills | Status: DC | PRN

## 2021-10-14 NOTE — ED Triage Notes (Signed)
Exposed to covid last week.  Runny nose, sore throat, body aches, no taste or smell, headaches since Friday.  Home test on Saturday was positive.

## 2021-10-14 NOTE — ED Provider Notes (Addendum)
[facility]    CSN: [id number] Arrival date & time: 10/14/21  1856      History   Chief Complaint No chief complaint on file.   HPI Lindsey Sampson is a 41 y.o. female presenting following covid exposure; positive home COVID test 1/14.  Medical history noncontributory, denies history of pulmonary disease.  Describes nasal congestion, sore throat, generalized myalgias, throbbing headaches.  Indigestion for about 1 day, has not been hydrating adequately due to this.  Has not run a temperature at home.  Denies shortness of breath, chest pain.  Continued nasal congestion without sinus pressure or ear pain.  Has been attempting Mucinex at home.  HPI  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    Past Surgical History:  Procedure Laterality Date   BACK SURGERY     BIOPSY  11/10/2019   Procedure: BIOPSY;  Surgeon: Danie Binder, MD;  [facility];  Service: Endoscopy;;   [place]  2006 and 2010   cyst removed from ovary Right    ESOPHAGOGASTRODUODENOSCOPY N/A 11/10/2019   Procedure: ESOPHAGOGASTRODUODENOSCOPY (EGD);  Surgeon: Danie Binder, MD;  [facility];  Service: Endoscopy;  Laterality: N/A;  12:00pm   LUMBAR LAMINECTOMY/DECOMPRESSION MICRODISCECTOMY Left 05/14/2016   Procedure: LUMBAR DECOMPRESSION/MICRODISCECTOMY LEFT LUMBAR FOUR-FIVE;  Surgeon: Melina Schools, MD;  Location: [place];  Service:  Orthopedics;  Laterality: Left;   ORIF TOE FRACTURE Right 09/07/2019   Procedure: IRRIGATION AND DEBRIDEMENT AND OPEN REDUCTION INTERNAL FIXATION (ORIF) Proximal phalanx FRACTURE;  Surgeon: Shona Needles, MD;  Location: [place];  Service: Orthopedics;  Laterality: Right;   ORIF TOE FRACTURE Right 04/03/2020   Procedure: REPAIR OF RIGHT GREAT TOE NONUNION  WITH BONE GRAFTING RIGHT HIP;  Surgeon: Shona Needles, MD;  Location: [place];  Service: Orthopedics;  Laterality: Right;   TUBAL LIGATION      OB History     Gravida  2   Para      Term      Preterm      AB      Living  2      SAB      IAB      Ectopic      Multiple  0   Live Births  2            Home Medications    Prior to Admission medications   Medication Sig Start Date End Date Taking? Authorizing Provider  ondansetron (ZOFRAN-ODT) 8 MG disintegrating tablet Take 1 tablet (8 mg total) by mouth every 8 (eight) hours as needed for nausea or vomiting. 10/14/21  Yes Hazel Sams, PA-C  oxymetazoline (AFRIN NASAL SPRAY) 0.05 % nasal spray Place 1 spray into both nostrils 2 (two) times daily. 10/14/21  Yes Hazel Sams, PA-C  promethazine-dextromethorphan (  PROMETHAZINE-DM) 6.25-15 MG/5ML syrup Take 2.5 mLs by mouth 4 (four) times daily as needed for cough. 10/14/21  Yes Hazel Sams, PA-C  acetaminophen (TYLENOL) 500 MG tablet Take 500 mg by mouth every 6 (six) hours as needed for mild pain.    [provider]  buPROPion (WELLBUTRIN) 75 MG tablet Take 75 mg by mouth 2 (two) times daily.    [provider]  buPROPion (ZYBAN) 150 MG 12 hr tablet Take 150 mg by mouth 2 (two) times daily. 10/03/19   [provider]  cyclobenzaprine (FLEXERIL) 5 MG tablet Take 1-2 tablets (5-10 mg total) by mouth at bedtime. 09/02/20   Scot Jun, FNP  FLUoxetine (PROZAC) 10 MG tablet Take 10 mg by mouth daily.    [provider]  HYDROcodone-acetaminophen (NORCO/VICODIN) 5-325 MG tablet  Take 1-2 tablets by mouth every 4 (four) hours as needed. 05/13/21   Orpah Greek, MD  metoCLOPramide (REGLAN) 10 MG tablet Take 1 tablet (10 mg total) by mouth every 6 (six) hours as needed for nausea. 01/27/21   Scot Jun, FNP  pantoprazole (PROTONIX) 40 MG tablet 1 PO 30 MINUTES PRIOR TO MEALS BID FOR 1 MO THEN QD FOR 2 MOS Patient taking differently: Take 40 mg by mouth daily as needed (acid reflux).  11/10/19   Fields, Marga Melnick, MD  tiZANidine (ZANAFLEX) 4 MG tablet Take 1 tablet (4 mg total) by mouth at bedtime. 10/09/21   Jaynee Eagles, PA-C  clonazePAM (KLONOPIN) 0.5 MG tablet Take 0.5 mg by mouth 3 (three) times daily as needed. For anxiety/sleep. 03/12/16 03/29/19  [provider]    Family History Family History  Problem Relation Age of Onset   Diabetes Mother    Hypertension Mother    High Cholesterol Mother    Hypertension Father    Prostate cancer Father    High Cholesterol Father    Colon polyps Maternal Grandmother    Colon cancer Neg Hx     Social History Social History   Tobacco Use   Smoking status: Light Smoker    Packs/day: 0.50    Types: Cigarettes   Smokeless tobacco: Never   Tobacco comments:    trying to quit   Vaping Use   Vaping Use: Former  Substance Use Topics   Alcohol use: Yes    Comment: rare   Drug use: No     Allergies   Prednisone   Review of Systems Review of Systems  Constitutional:  Negative for appetite change, chills and fever.  HENT:  Positive for congestion. Negative for ear pain, rhinorrhea, sinus pressure, sinus pain and sore throat.   Eyes:  Negative for redness and visual disturbance.  Respiratory:  Positive for cough. Negative for chest tightness, shortness of breath and wheezing.   Cardiovascular:  Negative for chest pain and palpitations.  Gastrointestinal:  Negative for abdominal pain, constipation, diarrhea, nausea and vomiting.  Genitourinary:  Negative for dysuria, frequency and urgency.   Musculoskeletal:  Negative for myalgias.  Neurological:  Negative for dizziness, weakness and headaches.  Psychiatric/Behavioral:  Negative for confusion.   All other systems reviewed and are negative.   Physical Exam Triage Vital Signs ED Triage Vitals  Enc Vitals Group     BP 10/14/21 0852 (!) 95/55     Pulse Rate 10/14/21 0852 87     Resp 10/14/21 0852 18     Temp 10/14/21 0852 98.7 F (37.1 C)     Temp Source 10/14/21 0852 Oral  SpO2 10/14/21 0852 98 %     Weight --      Height --      Head Circumference --      Peak Flow --      Pain Score 10/14/21 0854 10     Pain Loc --      Pain Edu? --      Excl. in [place]? --    No data found.  Updated Vital Signs BP (!) 95/55 (BP Location: Right Arm)    Pulse 87    Temp 98.7 F (37.1 C) (Oral)    Resp 18    LMP 09/29/2021 (Approximate)    SpO2 98%   Visual Acuity Right Eye Distance:   Left Eye Distance:   Bilateral Distance:    Right Eye Near:   Left Eye Near:    Bilateral Near:     Physical Exam Vitals reviewed.  Constitutional:      General: She is not in acute distress.    Appearance: Normal appearance. She is not ill-appearing.  HENT:     Head: Normocephalic and atraumatic.     Right Ear: Tympanic membrane, ear canal and external ear normal. No tenderness. No middle ear effusion. There is no impacted cerumen. Tympanic membrane is not perforated, erythematous, retracted or bulging.     Left Ear: Tympanic membrane, ear canal and external ear normal. No tenderness.  No middle ear effusion. There is no impacted cerumen. Tympanic membrane is not perforated, erythematous, retracted or bulging.     Nose: Nose normal. No congestion.     Mouth/Throat:     Mouth: Mucous membranes are moist.     Pharynx: Uvula midline. No oropharyngeal exudate or posterior oropharyngeal erythema.  Eyes:     Extraocular Movements: Extraocular movements intact.     Pupils: Pupils are equal, round, and reactive to light.  Cardiovascular:      Rate and Rhythm: Normal rate and regular rhythm.     Heart sounds: Normal heart sounds.  Pulmonary:     Effort: Pulmonary effort is normal.     Breath sounds: Normal breath sounds. No decreased breath sounds, wheezing, rhonchi or rales.  Abdominal:     General: Bowel sounds are increased.     Palpations: Abdomen is soft.     Tenderness: There is no abdominal tenderness. There is no guarding or rebound.     Comments: No pain   Lymphadenopathy:     Cervical: No cervical adenopathy.     Right cervical: No superficial cervical adenopathy.    Left cervical: No superficial cervical adenopathy.  Skin:    Capillary Refill: Capillary refill takes 2 to 3 seconds.  Neurological:     General: No focal deficit present.     Mental Status: She is alert and oriented to person, place, and time.  Psychiatric:        Mood and Affect: Mood normal.        Behavior: Behavior normal.        Thought Content: Thought content normal.        Judgment: Judgment normal.     UC Treatments / Results  Labs (all labs ordered are listed, but only abnormal results are displayed) Labs Reviewed - No data to display  EKG   Radiology No results found.  Procedures Procedures (including critical care time)  Medications Ordered in UC Medications - No data to display  Initial Impression / Assessment and Plan / UC Course  I have reviewed the triage  vital signs and the nursing notes.  Pertinent labs & imaging results that were available during my care of the patient were reviewed by me and considered in my medical decision making (see chart for details).     This patient is a very pleasant 41 y.o. year old female presenting with covid-19 following exposure to this.  Afebrile, nontachycardic.  Borderline hypotensive given indigestion.  Zofran ODT sent, good hydration.  Last period 09/29/2021, states she cannot be pregnant or breast-feeding.  No history of pulmonary disease.  Positive home COVID test, declines  PCR today.  She is low risk and out of antiviral window at this point.  Also sent Promethazine DM and Afrin as needed. ED return precautions discussed. Patient verbalizes understanding and agreement.   Coding Level 4 for acute illness with systemic symptoms (hypotension/ dehydration), and prescription drug management    Final Clinical Impressions(s) / UC Diagnoses   Final diagnoses:  COVID-19  Close exposure to COVID-19 virus  Indigestion     Discharge Instructions      -Promethazine DM cough syrup for congestion/cough. This could make you drowsy, so take at night before bed. -Afrin nasal spray for congestion - do not use more than 3 days in a row as this can cause rebound congestion -Take the Zofran (ondansetron) up to 3 times daily for nausea and vomiting. Dissolve one pill under your tongue or between your teeth and your cheek. -Drink plenty of fluids -With a virus, you're typically contagious for 5-7 days, or as long as you're having fevers.       ED Prescriptions     Medication Sig Dispense Auth. Provider   ondansetron (ZOFRAN-ODT) 8 MG disintegrating tablet Take 1 tablet (8 mg total) by mouth every 8 (eight) hours as needed for nausea or vomiting. 20 tablet Hazel Sams, PA-C   oxymetazoline (AFRIN NASAL SPRAY) 0.05 % nasal spray Place 1 spray into both nostrils 2 (two) times daily. 30 mL Hazel Sams, PA-C   promethazine-dextromethorphan (PROMETHAZINE-DM) 6.25-15 MG/5ML syrup Take 2.5 mLs by mouth 4 (four) times daily as needed for cough. 50 mL Hazel Sams, PA-C      PDMP not reviewed this encounter.   Hazel Sams, PA-C 10/14/21 [place], [place], PA-C 10/14/21 [phone]

## 2021-10-14 NOTE — Discharge Instructions (Addendum)
-  Promethazine DM cough syrup for congestion/cough. This could make you drowsy, so take at night before bed. -Afrin nasal spray for congestion - do not use more than 3 days in a row as this can cause rebound congestion -Take the Zofran (ondansetron) up to 3 times daily for nausea and vomiting. Dissolve one pill under your tongue or between your teeth and your cheek. -Drink plenty of fluids -With a virus, you're typically contagious for 5-7 days, or as long as you're having fevers.

## 2021-11-25 ENCOUNTER — Ambulatory Visit
Admission: RE | Admit: 2021-11-25 | Discharge: 2021-11-25 | Disposition: A | Discharge status: Home / Self Care | Payer: Medicaid Other | Source: Ambulatory Visit | Attending: Student | Admitting: Student

## 2021-11-25 ENCOUNTER — Other Ambulatory Visit: Payer: Self-pay

## 2021-11-25 VITALS — BP 129/77 | HR 93 | Temp 97.9°F | Resp 18

## 2021-11-25 DIAGNOSIS — G8929 Other chronic pain: Secondary | ICD-10-CM

## 2021-11-25 DIAGNOSIS — M545 Low back pain, unspecified: Secondary | ICD-10-CM

## 2021-11-25 MED ORDER — METHYLPREDNISOLONE SODIUM SUCC 125 MG IJ SOLR
80.0000 mg | Freq: Once | INTRAMUSCULAR | Status: AC
  Administered 2021-11-25: 80 mg via INTRAMUSCULAR

## 2021-11-25 MED ORDER — TIZANIDINE HCL 4 MG PO TABS: 4.0000 mg | ORAL_TABLET | Freq: Every day | ORAL | 0 refills | Status: DC

## 2021-11-25 MED ORDER — MELOXICAM 7.5 MG PO TABS: 7.5000 mg | ORAL_TABLET | Freq: Every day | ORAL | 0 refills | Status: DC

## 2021-11-25 NOTE — Discharge Instructions (Addendum)
-  Start the muscle relaxer-Zanaflex (tizanidine), up to 3 times daily for muscle spasms and pain.  This can make you drowsy, so take at bedtime or when you do not need to drive or operate machinery. -Meloxicam once daily taken with food., avoid other nsaids like ibuprofen and naproxen while taking this medication  -Follow-up with PCP for referral to spine specialist

## 2021-11-25 NOTE — ED Triage Notes (Signed)
Lower back pain since January.  Would like a refill for muscle relaxer that she had back in January.

## 2021-11-25 NOTE — ED Provider Notes (Signed)
[facility]    CSN: [id number] Arrival date & time: 11/25/21  0846      History   Chief Complaint No chief complaint on file.   HPI Lindsey Sampson is a 41 y.o. female presenting for follow-up of chronic back pain.  We last saw her 1/23 for this, at that time x-ray was reassuring.  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  Has not needed a follow-up until 2023.  She does not follow with an orthopedist.  She states she is here today because she continues to have the lower back pain, and she is out of the muscle relaxer.  States she tried to contact a spine specialist, but given Medicaid insurance she was not able to schedule this appointment.  She has not followed up with her primary care about it.  Today describes midline lower back pain with radiation to the right side.  Worse with ambulation and movement.  Denies radiation of the pain in the legs, denies new weakness in the legs, denies urinary symptoms, denies saddle anesthesia.   HPI  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    Past Surgical History:  Procedure Laterality Date   BACK SURGERY     BIOPSY  11/10/2019   Procedure: BIOPSY;  Surgeon: Danie Binder, MD;  [facility];  Service: Endoscopy;;   [place]  2006 and 2010   cyst removed from ovary Right    ESOPHAGOGASTRODUODENOSCOPY N/A 11/10/2019   Procedure:  ESOPHAGOGASTRODUODENOSCOPY (EGD);  Surgeon: Danie Binder, MD;  [facility];  Service: Endoscopy;  Laterality: N/A;  12:00pm   LUMBAR LAMINECTOMY/DECOMPRESSION MICRODISCECTOMY Left 05/14/2016   Procedure: LUMBAR DECOMPRESSION/MICRODISCECTOMY LEFT LUMBAR FOUR-FIVE;  Surgeon: Melina Schools, MD;  Location: [place];  Service: Orthopedics;  Laterality: Left;   ORIF TOE FRACTURE Right 09/07/2019   Procedure: IRRIGATION AND DEBRIDEMENT AND OPEN REDUCTION INTERNAL FIXATION (ORIF) Proximal phalanx FRACTURE;  Surgeon: Shona Needles, MD;  Location: [place];  Service: Orthopedics;  Laterality: Right;   ORIF TOE FRACTURE Right 04/03/2020   Procedure: REPAIR OF RIGHT GREAT TOE NONUNION  WITH BONE GRAFTING RIGHT HIP;  Surgeon: Shona Needles, MD;  Location: [place];  Service: Orthopedics;  Laterality: Right;   TUBAL LIGATION      OB History     Gravida  2   Para      Term      Preterm      AB      Living  2      SAB      IAB      Ectopic      Multiple  0   Live Births  2            Home Medications  Prior to Admission medications   Medication Sig Start Date End Date Taking? Authorizing Provider  meloxicam (MOBIC) 7.5 MG tablet Take 1 tablet (7.5 mg total) by mouth daily. 11/25/21  Yes Hazel Sams, PA-C  acetaminophen (TYLENOL) 500 MG tablet Take 500 mg by mouth every 6 (six) hours as needed for mild pain.    [provider]  buPROPion (WELLBUTRIN) 75 MG tablet Take 75 mg by mouth 2 (two) times daily.    [provider]  buPROPion (ZYBAN) 150 MG 12 hr tablet Take 150 mg by mouth 2 (two) times daily. 10/03/19   [provider]  FLUoxetine (PROZAC) 10 MG tablet Take 10 mg by mouth daily.    [provider]  HYDROcodone-acetaminophen (NORCO/VICODIN) 5-325 MG tablet Take 1-2 tablets by mouth every 4 (four) hours as needed. 05/13/21   Orpah Greek, MD  metoCLOPramide (REGLAN) 10 MG tablet Take 1 tablet (10 mg total) by mouth every  6 (six) hours as needed for nausea. 01/27/21   Scot Jun, FNP  ondansetron (ZOFRAN-ODT) 8 MG disintegrating tablet Take 1 tablet (8 mg total) by mouth every 8 (eight) hours as needed for nausea or vomiting. 10/14/21   Hazel Sams, PA-C  pantoprazole (PROTONIX) 40 MG tablet 1 PO 30 MINUTES PRIOR TO MEALS BID FOR 1 MO THEN QD FOR 2 MOS Patient taking differently: Take 40 mg by mouth daily as needed (acid reflux).  11/10/19   Fields, Marga Melnick, MD  tiZANidine (ZANAFLEX) 4 MG tablet Take 1 tablet (4 mg total) by mouth at bedtime. 11/25/21   Hazel Sams, PA-C  clonazePAM (KLONOPIN) 0.5 MG tablet Take 0.5 mg by mouth 3 (three) times daily as needed. For anxiety/sleep. 03/12/16 03/29/19  [provider]    Family History Family History  Problem Relation Age of Onset   Diabetes Mother    Hypertension Mother    High Cholesterol Mother    Hypertension Father    Prostate cancer Father    High Cholesterol Father    Colon polyps Maternal Grandmother    Colon cancer Neg Hx     Social History Social History   Tobacco Use   Smoking status: Light Smoker    Packs/day: 0.50    Types: Cigarettes   Smokeless tobacco: Never   Tobacco comments:    trying to quit   Vaping Use   Vaping Use: Former  Substance Use Topics   Alcohol use: Yes    Comment: rare   Drug use: No     Allergies   Prednisone   Review of Systems Review of Systems  Constitutional:  Negative for chills, fever and unexpected weight change.  Respiratory:  Negative for chest tightness and shortness of breath.   Cardiovascular:  Negative for chest pain and palpitations.  Gastrointestinal:  Negative for abdominal pain, diarrhea, nausea and vomiting.  Genitourinary:  Negative for decreased urine volume, difficulty urinating and frequency.  Musculoskeletal:  Positive for back pain. Negative for arthralgias, gait problem, joint swelling, myalgias, neck pain and neck stiffness.  Skin:  Negative for wound.   Neurological:  Negative for dizziness, tremors, seizures, syncope, facial asymmetry, speech difficulty, weakness, light-headedness, numbness and headaches.  All other systems reviewed and are negative.   Physical Exam Triage Vital Signs ED Triage Vitals  Enc Vitals Group     BP 11/25/21 0905 129/77     Pulse Rate 11/25/21 0905 93     Resp 11/25/21 0905 18     Temp  11/25/21 0905 97.9 F (36.6 C)     Temp Source 11/25/21 0905 Oral     SpO2 11/25/21 0905 98 %     Weight --      Height --      Head Circumference --      Peak Flow --      Pain Score 11/25/21 0907 10     Pain Loc --      Pain Edu? --      Excl. in [place]? --    No data found.  Updated Vital Signs BP 129/77 (BP Location: Right Arm)    Pulse 93    Temp 97.9 F (36.6 C) (Oral)    Resp 18    LMP 11/14/2021 (Exact Date)    SpO2 98%   Visual Acuity Right Eye Distance:   Left Eye Distance:   Bilateral Distance:    Right Eye Near:   Left Eye Near:    Bilateral Near:     Physical Exam Vitals reviewed.  Constitutional:      General: She is not in acute distress.    Appearance: Normal appearance. She is not ill-appearing.  HENT:     Head: Normocephalic and atraumatic.  Cardiovascular:     Rate and Rhythm: Normal rate and regular rhythm.     Heart sounds: Normal heart sounds.  Pulmonary:     Effort: Pulmonary effort is normal.     Breath sounds: Normal breath sounds and air entry.  Abdominal:     Tenderness: There is no abdominal tenderness. There is no right CVA tenderness, left CVA tenderness, guarding or rebound.  Musculoskeletal:     Cervical back: Normal range of motion. No swelling, deformity, signs of trauma, rigidity, spasms, tenderness, bony tenderness or crepitus. No pain with movement.     Thoracic back: No swelling, deformity, signs of trauma, spasms, tenderness or bony tenderness. Normal range of motion. No scoliosis.     Lumbar back: Spasms and tenderness present. No swelling, deformity, signs of  trauma or bony tenderness. Normal range of motion. Negative right straight leg raise test and negative left straight leg raise test. No scoliosis.     Comments: Midline lumbar spinous tenderness without step-off or deformity.  Right-sided lumbar paraspinous muscle tenderness and hypertonicity to palpation.  Pain elicited with flexion and extension lumbar spine.  No saddle anesthesia. No cervical or thoracic midline spinous tenderness, deformity, stepoff.  Gait intact.  Absolutely no other injury, deformity, tenderness, ecchymosis, abrasion.  Neurological:     General: No focal deficit present.     Mental Status: She is alert.     Cranial Nerves: No cranial nerve deficit.  Psychiatric:        Mood and Affect: Mood normal.        Behavior: Behavior normal.        Thought Content: Thought content normal.        Judgment: Judgment normal.     UC Treatments / Results  Labs (all labs ordered are listed, but only abnormal results are displayed) Labs Reviewed - No data to display  EKG   Radiology No results found.  Procedures Procedures (including critical care time)  Medications Ordered in UC Medications  methylPREDNISolone sodium succinate (SOLU-MEDROL) 125 mg/2 mL injection 80 mg (80 mg Intramuscular Given 11/25/21 0928)    Initial Impression / Assessment and Plan / UC Course  I have reviewed the triage vital signs and the nursing notes.  Pertinent labs & imaging results that were  available during my care of the patient were reviewed by me and considered in my medical decision making (see chart for details).     This patient is a very pleasant 41 y.o. year old female presenting with chronic back pain. No red flag symptoms today. History herniated disc and surgical repair 2017. Last visit with [place] for this was 10/09/21. LMP 11/14/21, States she is not pregnant or breastfeeding.  Xray 10/09/21 - 1. Lower lumbar spondylosis and degenerative disc disease. If pain persists despite  conservative therapy, MRI may be warranted for further characterization.  Zanaflex refilled, IM solumedrol administered. Meloxicam sent. Chart review indicated prednisone allergy; patient states this was actually thrush after taking prednisone PO, and she tolerates IM solumedrol. We administered IM solumedrol her last visit 1/23. Administered today, she tolerated this well.   Discussed that given Medicaid insurance she will need to f/u with her PCP for this chronic condition and to get a referral to spine.   ED return precautions discussed. Patient verbalizes understanding and agreement.   Final Clinical Impressions(s) / UC Diagnoses   Final diagnoses:  Chronic right-sided low back pain without sciatica     Discharge Instructions      -Start the muscle relaxer-Zanaflex (tizanidine), up to 3 times daily for muscle spasms and pain.  This can make you drowsy, so take at bedtime or when you do not need to drive or operate machinery. -Meloxicam once daily taken with food., avoid other nsaids like ibuprofen and naproxen while taking this medication  -Follow-up with PCP for referral to spine specialist      ED Prescriptions     Medication Sig Dispense Auth. Provider   tiZANidine (ZANAFLEX) 4 MG tablet Take 1 tablet (4 mg total) by mouth at bedtime. 30 tablet Hazel Sams, PA-C   meloxicam (MOBIC) 7.5 MG tablet Take 1 tablet (7.5 mg total) by mouth daily. 30 tablet Hazel Sams, PA-C      PDMP not reviewed this encounter.   Hazel Sams, PA-C 11/25/21 [id number]

## 2021-11-26 ENCOUNTER — Other Ambulatory Visit: Payer: Self-pay | Admitting: Sports Medicine

## 2021-11-26 DIAGNOSIS — Z1231 Encounter for screening mammogram for malignant neoplasm of breast: Secondary | ICD-10-CM

## 2021-12-04 ENCOUNTER — Other Ambulatory Visit: Payer: Self-pay | Admitting: Orthopedic Surgery

## 2021-12-04 DIAGNOSIS — M259 Joint disorder, unspecified: Secondary | ICD-10-CM | POA: Diagnosis not present

## 2021-12-04 DIAGNOSIS — M545 Low back pain, unspecified: Secondary | ICD-10-CM | POA: Insufficient documentation

## 2021-12-04 DIAGNOSIS — M549 Dorsalgia, unspecified: Secondary | ICD-10-CM | POA: Insufficient documentation

## 2021-12-04 DIAGNOSIS — M5451 Vertebrogenic low back pain: Secondary | ICD-10-CM | POA: Diagnosis not present

## 2021-12-04 DIAGNOSIS — M5136 Other intervertebral disc degeneration, lumbar region: Secondary | ICD-10-CM | POA: Diagnosis not present

## 2021-12-17 ENCOUNTER — Ambulatory Visit
Admission: RE | Admit: 2021-12-17 | Discharge: 2021-12-17 | Disposition: A | Discharge status: Home / Self Care | Payer: Medicaid Other | Source: Ambulatory Visit | Attending: Sports Medicine | Admitting: Sports Medicine

## 2021-12-17 DIAGNOSIS — Z1231 Encounter for screening mammogram for malignant neoplasm of breast: Secondary | ICD-10-CM | POA: Diagnosis not present

## 2021-12-26 ENCOUNTER — Ambulatory Visit
Admission: RE | Admit: 2021-12-26 | Discharge: 2021-12-26 | Disposition: A | Discharge status: Home / Self Care | Payer: Medicaid Other | Source: Ambulatory Visit | Attending: Orthopedic Surgery | Admitting: Orthopedic Surgery

## 2021-12-26 DIAGNOSIS — M259 Joint disorder, unspecified: Secondary | ICD-10-CM

## 2022-01-05 DIAGNOSIS — M259 Joint disorder, unspecified: Secondary | ICD-10-CM | POA: Diagnosis not present

## 2022-01-05 DIAGNOSIS — M5451 Vertebrogenic low back pain: Secondary | ICD-10-CM | POA: Diagnosis not present

## 2022-01-09 DIAGNOSIS — M533 Sacrococcygeal disorders, not elsewhere classified: Secondary | ICD-10-CM | POA: Insufficient documentation

## 2022-01-22 DIAGNOSIS — M533 Sacrococcygeal disorders, not elsewhere classified: Secondary | ICD-10-CM | POA: Diagnosis not present

## 2022-02-11 DIAGNOSIS — M533 Sacrococcygeal disorders, not elsewhere classified: Secondary | ICD-10-CM | POA: Diagnosis not present

## 2022-02-20 ENCOUNTER — Ambulatory Visit (HOSPITAL_COMMUNITY): Payer: Medicaid Other

## 2022-02-21 ENCOUNTER — Ambulatory Visit
Admission: EM | Admit: 2022-02-21 | Discharge: 2022-02-21 | Disposition: A | Discharge status: Home / Self Care | Payer: Medicaid Other | Attending: Nurse Practitioner | Admitting: Nurse Practitioner

## 2022-02-21 ENCOUNTER — Encounter: Payer: Self-pay | Admitting: Emergency Medicine

## 2022-02-21 DIAGNOSIS — H6593 Unspecified nonsuppurative otitis media, bilateral: Secondary | ICD-10-CM | POA: Diagnosis not present

## 2022-02-21 DIAGNOSIS — J029 Acute pharyngitis, unspecified: Secondary | ICD-10-CM | POA: Diagnosis not present

## 2022-02-21 LAB — POCT RAPID STREP A (OFFICE): Rapid Strep A Screen: NEGATIVE

## 2022-02-21 MED ORDER — IPRATROPIUM BROMIDE 0.03 % NA SOLN: 2.0000 | Freq: Two times a day (BID) | NASAL | 12 refills | Status: DC

## 2022-02-21 MED ORDER — LIDOCAINE VISCOUS HCL 2 % MT SOLN: 5.0000 mL | Freq: Four times a day (QID) | OROMUCOSAL | 0 refills | Status: DC | PRN

## 2022-02-21 MED ORDER — IBUPROFEN 800 MG PO TABS: 800.0000 mg | ORAL_TABLET | Freq: Three times a day (TID) | ORAL | 0 refills | Status: DC

## 2022-02-21 NOTE — ED Provider Notes (Signed)
[facility]    CSN: [id number] Arrival date & time: 02/21/22  0947      History   Chief Complaint No chief complaint on file.   HPI Lindsey Sampson is a 41 y.o. female.   HPI The patient is a 41 year old female who presents with sore throat and bilateral ear pain.  Symptoms have been present for the past 2 days.  Patient complains of pain with swallowing, nasal congestion and a mild cough.  She denies fever, chills, headache, or GI symptoms.  Patient states she has been taking over-the-counter medication for her symptoms.  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    Past Surgical History:  Procedure Laterality Date   BACK SURGERY     BIOPSY  11/10/2019   Procedure: BIOPSY;  Surgeon: Danie Binder, MD;  [facility];  Service: Endoscopy;;   [place]  2006 and 2010   cyst removed from ovary Right    ESOPHAGOGASTRODUODENOSCOPY N/A 11/10/2019   Procedure: ESOPHAGOGASTRODUODENOSCOPY (EGD);  Surgeon: Danie Binder, MD;  [facility];  Service: Endoscopy;  Laterality: N/A;  12:00pm   LUMBAR LAMINECTOMY/DECOMPRESSION MICRODISCECTOMY Left 05/14/2016   Procedure: LUMBAR DECOMPRESSION/MICRODISCECTOMY LEFT LUMBAR FOUR-FIVE;  Surgeon: Melina Schools, MD;  Location: [place];  Service: Orthopedics;  Laterality: Left;   ORIF TOE FRACTURE Right 09/07/2019   Procedure: IRRIGATION AND DEBRIDEMENT AND OPEN REDUCTION  INTERNAL FIXATION (ORIF) Proximal phalanx FRACTURE;  Surgeon: Shona Needles, MD;  Location: [place];  Service: Orthopedics;  Laterality: Right;   ORIF TOE FRACTURE Right 04/03/2020   Procedure: REPAIR OF RIGHT GREAT TOE NONUNION  WITH BONE GRAFTING RIGHT HIP;  Surgeon: Shona Needles, MD;  Location: [place];  Service: Orthopedics;  Laterality: Right;   TUBAL LIGATION      OB History     Gravida  2   Para      Term      Preterm      AB      Living  2      SAB      IAB      Ectopic      Multiple  0   Live Births  2            Home Medications    Prior to Admission medications   Medication Sig Start Date End Date Taking? Authorizing Provider  ibuprofen (ADVIL) 800 MG tablet Take 1 tablet (800 mg total) by mouth 3 (three) times daily. 02/21/22  Yes Cheronda Erck-Warren, Alda Lea, NP  ipratropium (ATROVENT) 0.03 % nasal spray Place 2 sprays into both nostrils every 12 (twelve) hours. 02/21/22  Yes Naviyah Schaffert-Warren, Alda Lea, NP  lidocaine (XYLOCAINE) 2 % solution Use as directed 5 mLs in the mouth or throat every 6 (six) hours as needed for mouth pain.  02/21/22  Yes Jarmaine Ehrler-Warren, Alda Lea, NP  acetaminophen (TYLENOL) 500 MG tablet Take 500 mg by mouth every 6 (six) hours as needed for mild pain.    [provider]  buPROPion (WELLBUTRIN) 75 MG tablet Take 75 mg by mouth 2 (two) times daily.    [provider]  buPROPion (ZYBAN) 150 MG 12 hr tablet Take 150 mg by mouth 2 (two) times daily. 10/03/19   [provider]  FLUoxetine (PROZAC) 10 MG tablet Take 10 mg by mouth daily.    [provider]  HYDROcodone-acetaminophen (NORCO/VICODIN) 5-325 MG tablet Take 1-2 tablets by mouth every 4 (four) hours as needed. 05/13/21   Orpah Greek, MD  meloxicam (MOBIC) 7.5 MG tablet Take 1 tablet (7.5 mg total) by mouth daily. 11/25/21   Hazel Sams, PA-C  metoCLOPramide (REGLAN) 10 MG tablet Take 1 tablet (10 mg total) by mouth every 6 (six) hours as  needed for nausea. 01/27/21   Scot Jun, FNP  ondansetron (ZOFRAN-ODT) 8 MG disintegrating tablet Take 1 tablet (8 mg total) by mouth every 8 (eight) hours as needed for nausea or vomiting. 10/14/21   Hazel Sams, PA-C  pantoprazole (PROTONIX) 40 MG tablet 1 PO 30 MINUTES PRIOR TO MEALS BID FOR 1 MO THEN QD FOR 2 MOS Patient taking differently: Take 40 mg by mouth daily as needed (acid reflux).  11/10/19   Fields, Marga Melnick, MD  tiZANidine (ZANAFLEX) 4 MG tablet Take 1 tablet (4 mg total) by mouth at bedtime. 11/25/21   Hazel Sams, PA-C  clonazePAM (KLONOPIN) 0.5 MG tablet Take 0.5 mg by mouth 3 (three) times daily as needed. For anxiety/sleep. 03/12/16 03/29/19  [provider]    Family History Family History  Problem Relation Age of Onset   Diabetes Mother    Hypertension Mother    High Cholesterol Mother    Hypertension Father    Prostate cancer Father    High Cholesterol Father    Colon polyps Maternal Grandmother    Colon cancer Neg Hx     Social History Social History   Tobacco Use   Smoking status: Light Smoker    Packs/day: 0.50    Types: Cigarettes   Smokeless tobacco: Never   Tobacco comments:    trying to quit   Vaping Use   Vaping Use: Former  Substance Use Topics   Alcohol use: Yes    Comment: rare   Drug use: No     Allergies   Prednisone   Review of Systems Review of Systems PER HPI  Physical Exam Triage Vital Signs ED Triage Vitals  Enc Vitals Group     BP 02/21/22 1009 112/74     Pulse Rate 02/21/22 1009 79     Resp 02/21/22 1009 18     Temp 02/21/22 1009 98.9 F (37.2 C)     Temp Source 02/21/22 1009 Oral     SpO2 02/21/22 1009 98 %     Weight --      Height --      Head Circumference --      Peak Flow --      Pain Score 02/21/22 1010 9     Pain Loc --      Pain Edu? --      Excl. in [place]? --    No data found.  Updated Vital Signs BP 112/74 (BP Location: Right Arm)   Pulse 79   Temp 98.9 F (37.2 C) (Oral)  Resp 18   LMP 02/06/2022 (Exact Date)   SpO2 98%   Visual Acuity Right Eye Distance:   Left Eye Distance:   Bilateral Distance:    Right Eye Near:   Left Eye Near:    Bilateral Near:     Physical Exam Vitals and nursing note reviewed.  Constitutional:      Appearance: Normal appearance.  HENT:     Head: Normocephalic.     Right Ear: Ear canal and external ear normal.     Left Ear: Ear canal and external ear normal.     Nose: Congestion present.     Right Turbinates: Enlarged and swollen.     Left Turbinates: Enlarged and swollen.     Mouth/Throat:     Mouth: Mucous membranes are moist.     Pharynx: Pharyngeal swelling and posterior oropharyngeal erythema present. No oropharyngeal exudate or uvula swelling.     Tonsils: No tonsillar exudate. 1+ on the right. 1+ on the left.  Eyes:     Extraocular Movements: Extraocular movements intact.     Conjunctiva/sclera: Conjunctivae normal.     Pupils: Pupils are equal, round, and reactive to light.  Cardiovascular:     Rate and Rhythm: Normal rate and regular rhythm.     Pulses: Normal pulses.     Heart sounds: Normal heart sounds.  Pulmonary:     Effort: Pulmonary effort is normal.     Breath sounds: Normal breath sounds.  Abdominal:     General: Bowel sounds are normal.     Palpations: Abdomen is soft.  Musculoskeletal:     Cervical back: Normal range of motion.  Lymphadenopathy:     Cervical: No cervical adenopathy.  Skin:    General: Skin is warm and dry.     Capillary Refill: Capillary refill takes less than 2 seconds.  Neurological:     General: No focal deficit present.     Mental Status: She is alert and oriented to person, place, and time.  Psychiatric:        Mood and Affect: Mood normal.        Behavior: Behavior normal.     UC Treatments / Results  Labs (all labs ordered are listed, but only abnormal results are displayed) Labs Reviewed  CULTURE, GROUP A STREP [facility])  POCT RAPID STREP A (OFFICE)     EKG   Radiology No results found.  Procedures Procedures (including critical care time)  Medications Ordered in UC Medications - No data to display  Initial Impression / Assessment and Plan / UC Course  I have reviewed the triage vital signs and the nursing notes.  Pertinent labs & imaging results that were available during my care of the patient were reviewed by me and considered in my medical decision making (see chart for details).  Symptoms have been present for the past 2 days.  Patient reports moderate pain with swallowing with nasal congestion and cough.  Her exam is reassuring, her vital signs are stable.  Otherwise vital signs reassuring.  Rapid strep negative, throat culture pending.  Treat with viscous lidocaine, over-the-counter fever reducers, supportive care.  We will also provide a prescription for Fluticasone to help with the middle ear effusions. Strict return precautions were provided, patient's mother advised to follow-up as needed.  Final Clinical Impressions(s) / UC Diagnoses   Final diagnoses:  Sore throat  Middle ear effusion, bilateral     Discharge Instructions      Take medication as prescribed. Increase  fluids and allow for plenty of rest. Warm salt water gargles 3-4 times daily until symptoms improve. Recommend a soft diet to include soups, broths, yogurts, warm or cool liquids, or teas with honey while you have symptoms. Your rapid strep test was negative today.  A throat culture has been ordered.  If the culture results are positive, you will be contacted and provided treatment. Apply warm compresses to the ears to help with pain. Follow-up if your symptoms worsen or do not improve.     ED Prescriptions     Medication Sig Dispense Auth. Provider   ibuprofen (ADVIL) 800 MG tablet Take 1 tablet (800 mg total) by mouth 3 (three) times daily. 21 tablet Ame Heagle-Warren, Alda Lea, NP   lidocaine (XYLOCAINE) 2 % solution Use as directed 5 mLs in  the mouth or throat every 6 (six) hours as needed for mouth pain. 75 mL Wreatha Sturgeon-Warren, Alda Lea, NP   ipratropium (ATROVENT) 0.03 % nasal spray Place 2 sprays into both nostrils every 12 (twelve) hours. 30 mL Zaul Hubers-Warren, Alda Lea, NP      PDMP not reviewed this encounter.   Tish Men, NP 02/21/22 1059

## 2022-02-21 NOTE — Discharge Instructions (Signed)
Take medication as prescribed. Increase fluids and allow for plenty of rest. Warm salt water gargles 3-4 times daily until symptoms improve. Recommend a soft diet to include soups, broths, yogurts, warm or cool liquids, or teas with honey while you have symptoms. Your rapid strep test was negative today.  A throat culture has been ordered.  If the culture results are positive, you will be contacted and provided treatment. Apply warm compresses to the ears to help with pain. Follow-up if your symptoms worsen or do not improve.

## 2022-02-21 NOTE — ED Triage Notes (Signed)
Sore throat and bilateral ear pain since Thursday night.

## 2022-02-24 LAB — CULTURE, GROUP A STREP (THRC)

## 2022-03-02 ENCOUNTER — Encounter: Payer: Self-pay | Admitting: Nurse Practitioner

## 2022-03-02 ENCOUNTER — Other Ambulatory Visit: Payer: Self-pay | Admitting: Nurse Practitioner

## 2022-03-02 ENCOUNTER — Ambulatory Visit: Payer: Medicaid Other | Admitting: Nurse Practitioner

## 2022-03-02 VITALS — BP 118/78 | HR 80 | Temp 98.2°F | Ht 64.0 in | Wt 183.0 lb

## 2022-03-02 DIAGNOSIS — N946 Dysmenorrhea, unspecified: Secondary | ICD-10-CM | POA: Insufficient documentation

## 2022-03-02 DIAGNOSIS — N920 Excessive and frequent menstruation with regular cycle: Secondary | ICD-10-CM | POA: Diagnosis not present

## 2022-03-02 DIAGNOSIS — Z136 Encounter for screening for cardiovascular disorders: Secondary | ICD-10-CM

## 2022-03-02 DIAGNOSIS — H6593 Unspecified nonsuppurative otitis media, bilateral: Secondary | ICD-10-CM

## 2022-03-02 DIAGNOSIS — F419 Anxiety disorder, unspecified: Secondary | ICD-10-CM | POA: Insufficient documentation

## 2022-03-02 DIAGNOSIS — F331 Major depressive disorder, recurrent, moderate: Secondary | ICD-10-CM

## 2022-03-02 DIAGNOSIS — E669 Obesity, unspecified: Secondary | ICD-10-CM

## 2022-03-02 DIAGNOSIS — K219 Gastro-esophageal reflux disease without esophagitis: Secondary | ICD-10-CM | POA: Insufficient documentation

## 2022-03-02 DIAGNOSIS — R5383 Other fatigue: Secondary | ICD-10-CM | POA: Diagnosis not present

## 2022-03-02 DIAGNOSIS — D1803 Hemangioma of intra-abdominal structures: Secondary | ICD-10-CM | POA: Insufficient documentation

## 2022-03-02 DIAGNOSIS — N921 Excessive and frequent menstruation with irregular cycle: Secondary | ICD-10-CM | POA: Insufficient documentation

## 2022-03-02 DIAGNOSIS — Z131 Encounter for screening for diabetes mellitus: Secondary | ICD-10-CM | POA: Diagnosis not present

## 2022-03-02 DIAGNOSIS — Z720 Tobacco use: Secondary | ICD-10-CM | POA: Insufficient documentation

## 2022-03-02 DIAGNOSIS — R109 Unspecified abdominal pain: Secondary | ICD-10-CM | POA: Insufficient documentation

## 2022-03-02 LAB — CBC
HCT: 37.5 % (ref 36.0–46.0)
Hemoglobin: 13 g/dL (ref 12.0–15.0)
MCHC: 34.6 g/dL (ref 30.0–36.0)
MCV: 93.5 fl (ref 78.0–100.0)
Platelets: 274 10*3/uL (ref 150.0–400.0)
RBC: 4.01 Mil/uL (ref 3.87–5.11)
RDW: 12.6 % (ref 11.5–15.5)
WBC: 7 10*3/uL (ref 4.0–10.5)

## 2022-03-02 LAB — COMPREHENSIVE METABOLIC PANEL
ALT: 18 U/L (ref 0–35)
AST: 13 U/L (ref 0–37)
Albumin: 4.4 g/dL (ref 3.5–5.2)
Alkaline Phosphatase: 61 U/L (ref 39–117)
BUN: 11 mg/dL (ref 6–23)
CO2: 24 mEq/L (ref 19–32)
Calcium: 9.2 mg/dL (ref 8.4–10.5)
Chloride: 106 mEq/L (ref 96–112)
Creatinine, Ser: 0.74 mg/dL (ref 0.40–1.20)
GFR: 99.92 mL/min (ref >60.00)
Glucose, Bld: 94 mg/dL (ref 70–99)
Potassium: 4.2 mEq/L (ref 3.5–5.1)
Sodium: 138 mEq/L (ref 135–145)
Total Bilirubin: 0.3 mg/dL (ref 0.2–1.2)
Total Protein: 7.3 g/dL (ref 6.0–8.3)

## 2022-03-02 LAB — LIPID PANEL
Cholesterol: 227 mg/dL — ABNORMAL HIGH (ref 0–200)
HDL: 44.1 mg/dL (ref >39.00)
LDL Cholesterol: 152 mg/dL — ABNORMAL HIGH (ref 0–99)
NonHDL: 182.7
Total CHOL/HDL Ratio: 5
Triglycerides: 152 mg/dL — ABNORMAL HIGH (ref 0.0–149.0)
VLDL: 30.4 mg/dL (ref 0.0–40.0)

## 2022-03-02 LAB — HEMOGLOBIN A1C: Hgb A1c MFr Bld: 5.3 % (ref 4.6–6.5)

## 2022-03-02 LAB — TSH: TSH: 1.33 u[IU]/mL (ref 0.35–5.50)

## 2022-03-02 MED ORDER — PANTOPRAZOLE SODIUM 40 MG PO TBEC: 40.0000 mg | DELAYED_RELEASE_TABLET | Freq: Every day | ORAL | 1 refills | Status: DC | PRN

## 2022-03-02 MED ORDER — FLUOXETINE HCL 10 MG PO CAPS: ORAL_CAPSULE | ORAL | 0 refills | Status: DC

## 2022-03-02 MED ORDER — FLUTICASONE PROPIONATE 50 MCG/ACT NA SUSP: 2.0000 | Freq: Every day | NASAL | 6 refills | Status: DC

## 2022-03-02 MED ORDER — BUPROPION HCL ER (XL) 150 MG PO TB24: 150.0000 mg | ORAL_TABLET | Freq: Every day | ORAL | 2 refills | Status: DC

## 2022-03-02 NOTE — Progress Notes (Signed)
New Patient Office Visit  Subjective    Patient ID: Lindsey Sampson, female    DOB: [birth year]  Age: 41 y.o. MRN: [id number]  CC:  Chief Complaint  Patient presents with  . New Patient (Initial Visit)    HPI Lindsey Sampson presents to establish care.  She is changing providers, and has a few concerns today.  They are listed below:  Fatigue/Depression: She reports that she has felt fatigued for "years".  She does have insomnia at times as well and reports 5 to 8 hours of sleep per night.  Sometimes even less.  She also tells me that she is a snorer but has not been notified of any witnessed apneic episodes.  She reports heavy menstrual bleeding with her periods.  Skin lesion: She has recurrent lesions to her lower abdomen groin area that can be painful at times.  She would like this to be evaluated as she has 1 that became apparent about 2 weeks ago.  It has been improving over the last 2 weeks.  Heavy periods: She reports history of heavy periods but has not been evaluated by OB/GYN for this.  She also reports headaches with her menstrual cycle.  She is even considered possibly being evaluated for hysterectomy because the heavy bleeding is so bothersome to her.  She has already undergone tubal ligation.  Obesity: She has been trying to lose weight but has been unable to do so.  She would like to discuss pharmacological treatment for this.  Outpatient Encounter Medications as of 03/02/2022  Medication Sig  . buPROPion (WELLBUTRIN XL) 150 MG 24 hr tablet Take 1 tablet (150 mg total) by mouth daily.  Marland Kitchen FLUoxetine (PROZAC) 10 MG capsule Take 1 capsule by mouth every other day for 1 week then stop.  . fluticasone (FLONASE) 50 MCG/ACT nasal spray Place 2 sprays into both nostrils daily.  Marland Kitchen acetaminophen (TYLENOL) 500 MG tablet Take 500 mg by mouth every 6 (six) hours as needed for mild pain.  . celecoxib (CELEBREX) 200 MG capsule celecoxib 200 mg capsule  TAKE 1 CAPSULE BY MOUTH TWICE DAILY AS  NEEDED FOR 15 DAYS  . famotidine (PEPCID) 40 MG tablet Pepcid 40 mg tablet  Take 1 tablet twice a day by oral route before meals for 30 days.  . metoCLOPramide (REGLAN) 10 MG tablet Take 1 tablet (10 mg total) by mouth every 6 (six) hours as needed for nausea.  . ondansetron (ZOFRAN) 8 MG tablet Take 8 mg by mouth 3 (three) times daily as needed.  . ondansetron (ZOFRAN-ODT) 8 MG disintegrating tablet Take 1 tablet (8 mg total) by mouth every 8 (eight) hours as needed for nausea or vomiting.  . [DISCONTINUED] buPROPion (WELLBUTRIN) 75 MG tablet Take 75 mg by mouth 2 (two) times daily.  . [DISCONTINUED] buPROPion (ZYBAN) 150 MG 12 hr tablet Take 150 mg by mouth 2 (two) times daily.  . [DISCONTINUED] clonazePAM (KLONOPIN) 0.5 MG tablet Take 0.5 mg by mouth 3 (three) times daily as needed. For anxiety/sleep.  . [DISCONTINUED] FLUoxetine (PROZAC) 10 MG tablet Take 10 mg by mouth daily.  . [DISCONTINUED] HYDROcodone-acetaminophen (NORCO/VICODIN) 5-325 MG tablet Take 1-2 tablets by mouth every 4 (four) hours as needed.  . [DISCONTINUED] ibuprofen (ADVIL) 800 MG tablet Take 1 tablet (800 mg total) by mouth 3 (three) times daily.  . [DISCONTINUED] ipratropium (ATROVENT) 0.03 % nasal spray Place 2 sprays into both nostrils every 12 (twelve) hours.  . [DISCONTINUED] lidocaine (XYLOCAINE) 2 % solution Use  as directed 5 mLs in the mouth or throat every 6 (six) hours as needed for mouth pain.  . [DISCONTINUED] meloxicam (MOBIC) 7.5 MG tablet Take 1 tablet (7.5 mg total) by mouth daily.  . [DISCONTINUED] pantoprazole (PROTONIX) 40 MG tablet 1 PO 30 MINUTES PRIOR TO MEALS BID FOR 1 MO THEN QD FOR 2 MOS (Patient taking differently: Take 40 mg by mouth daily as needed (acid reflux). )  . [DISCONTINUED] tiZANidine (ZANAFLEX) 4 MG tablet Take 1 tablet (4 mg total) by mouth at bedtime.   No facility-administered encounter medications on file as of 03/02/2022.    Past Medical History:  Diagnosis Date  . Anxiety   .  Colitis   . Cyst of spleen   . Depression   . Ovarian cyst   . Renal disorder    renal cyst,     Past Surgical History:  Procedure Laterality Date  . BACK SURGERY    . BIOPSY  11/10/2019   Procedure: BIOPSY;  Surgeon: Danie Binder, MD;  [facility];  Service: Endoscopy;;  . CESAREAN SECTION    . CESAREAN SECTION  2006 and 2010  . cyst removed from ovary Right   . ESOPHAGOGASTRODUODENOSCOPY N/A 11/10/2019   Procedure: ESOPHAGOGASTRODUODENOSCOPY (EGD);  Surgeon: Danie Binder, MD;  [facility];  Service: Endoscopy;  Laterality: N/A;  12:00pm  . LUMBAR LAMINECTOMY/DECOMPRESSION MICRODISCECTOMY Left 05/14/2016   Procedure: LUMBAR DECOMPRESSION/MICRODISCECTOMY LEFT LUMBAR FOUR-FIVE;  Surgeon: Melina Schools, MD;  Location: [place];  Service: Orthopedics;  Laterality: Left;  . ORIF TOE FRACTURE Right 09/07/2019   Procedure: IRRIGATION AND DEBRIDEMENT AND OPEN REDUCTION INTERNAL FIXATION (ORIF) Proximal phalanx FRACTURE;  Surgeon: Shona Needles, MD;  Location: [place];  Service: Orthopedics;  Laterality: Right;  . ORIF TOE FRACTURE Right 04/03/2020   Procedure: REPAIR OF RIGHT GREAT TOE NONUNION  WITH BONE GRAFTING RIGHT HIP;  Surgeon: Shona Needles, MD;  Location: [place];  Service: Orthopedics;  Laterality: Right;  . TUBAL LIGATION      Family History  Problem Relation Age of Onset  . Diabetes Mother   . Hypertension Mother   . High Cholesterol Mother   . Hypertension Father   . Prostate cancer Father   . High Cholesterol Father   . Colon polyps Maternal Grandmother   . Colon cancer Neg Hx     Social History   Socioeconomic History  . Marital status: Single    Spouse name: Not on file  . Number of children: Not on file  . Years of education: Not on file  . Highest education level: Not on file  Occupational History  . Not on file  Tobacco Use  . Smoking status: Light Smoker    Packs/day: 0.50    Types: Cigarettes  . Smokeless tobacco: Never  .  Tobacco comments:    trying to quit   Vaping Use  . Vaping Use: Former  Substance and Sexual Activity  . Alcohol use: Yes    Comment: rare  . Drug use: No  . Sexual activity: Yes    Partners: Male    Birth control/protection: Surgical  Other Topics Concern  . Not on file  Social History Narrative  . Not on file   Social Determinants of Health   Financial Resource Strain: Not on file  Food Insecurity: Not on file  Transportation Needs: Not on file  Physical Activity: Not on file  Stress: Not on file  Social Connections: Not on file  Intimate Partner Violence: Not on file    Review of Systems  Constitutional:  Positive for malaise/fatigue. Negative for fever and weight loss (gaining).  Respiratory:  Negative for cough, shortness of breath and wheezing.   Cardiovascular:  Negative for chest pain and palpitations.  Gastrointestinal:  Positive for nausea. Negative for abdominal pain (hx of endoscopy with inflammation noted; stopped excedrin per doc rec), blood in stool, constipation, diarrhea and vomiting.  Neurological:  Positive for headaches (with last menstrual period). Negative for dizziness and loss of consciousness.  Psychiatric/Behavioral:  Negative for depression and suicidal ideas. The patient is nervous/anxious.        Objective    BP 118/78 (BP Location: Left Arm, Patient Position: Sitting, Cuff Size: Normal)   Pulse 80   Temp 98.2 F (36.8 C) (Oral)   Ht '5\' 4"'$  (1.626 m)   Wt 183 lb (83 kg)   LMP 02/06/2022 (Exact Date)   SpO2 97%   BMI 31.41 kg/m      03/02/2022    9:57 AM  PHQ9 SCORE ONLY  PHQ-9 Total Score 6      Physical Exam Vitals reviewed.  Constitutional:      General: She is not in acute distress.    Appearance: Normal appearance.  HENT:     Head: Normocephalic and atraumatic.     Right Ear: A middle ear effusion is present.     Left Ear: A middle ear effusion is present.  Neck:     Vascular: No carotid bruit.  Cardiovascular:      Rate and Rhythm: Normal rate and regular rhythm.     Pulses: Normal pulses.     Heart sounds: Normal heart sounds.  Pulmonary:     Effort: Pulmonary effort is normal.     Breath sounds: Normal breath sounds.  Skin:    General: Skin is warm and dry.       Neurological:     General: No focal deficit present.     Mental Status: She is alert and oriented to person, place, and time.  Psychiatric:        Mood and Affect: Mood normal.        Behavior: Behavior normal.        Judgment: Judgment normal.        Assessment & Plan:   Problem List Items Addressed This Visit       Nervous and Auditory   Bilateral serous otitis media    Acute, patient encouraged to consider using Flonase nasal spray and her over-the-counter allergy pill.  Symptoms should resolve spontaneously on their own over time.  Patient encouraged let me know if symptoms persist.       Relevant Medications   fluticasone (FLONASE) 50 MCG/ACT nasal spray   Other Relevant Orders   TSH (Completed)   Hemoglobin A1c (Completed)   Lipid panel (Completed)   Comprehensive metabolic panel (Completed)   CBC (Completed)     Other   Menorrhagia with regular cycle - Primary    Recommend evaluation with OB/GYN as well as assistance with management.  Patient is agreeable to this referral ordered today.       Relevant Medications   fluticasone (FLONASE) 50 MCG/ACT nasal spray   Other Relevant Orders   Ambulatory referral to Obstetrics / Gynecology   TSH (Completed)   Hemoglobin A1c (Completed)   Lipid panel (Completed)   Comprehensive metabolic panel (Completed)   CBC (Completed)   Fatigue    Blood work ordered  for further evaluation, further recommendations may be made based upon the results.  We will consider referral to neurology for sleep apnea testing in the future if needed.       Relevant Medications   fluticasone (FLONASE) 50 MCG/ACT nasal spray   Other Relevant Orders   TSH (Completed)   Hemoglobin A1c  (Completed)   Lipid panel (Completed)   Comprehensive metabolic panel (Completed)   CBC (Completed)   Obesity (BMI 30-39.9)    She may be a good candidate for pharmacological therapy.  Unfortunately her insurance does not seem to cover Wegovy or orlistat.  May consider phentermine at next office visit.       Relevant Medications   fluticasone (FLONASE) 50 MCG/ACT nasal spray   Other Relevant Orders   TSH (Completed)   Hemoglobin A1c (Completed)   Lipid panel (Completed)   Comprehensive metabolic panel (Completed)   CBC (Completed)   Screening for cardiovascular condition    Blood work ordered today for further evaluation, further recommendations may be made based upon these results.       Relevant Medications   fluticasone (FLONASE) 50 MCG/ACT nasal spray   Other Relevant Orders   TSH (Completed)   Hemoglobin A1c (Completed)   Lipid panel (Completed)   Comprehensive metabolic panel (Completed)   CBC (Completed)   Screening for diabetes mellitus    Blood work ordered today for further evaluation, further recommendations may be made based upon these results.       Relevant Medications   fluticasone (FLONASE) 50 MCG/ACT nasal spray   Other Relevant Orders   TSH (Completed)   Hemoglobin A1c (Completed)   Lipid panel (Completed)   Comprehensive metabolic panel (Completed)   CBC (Completed)   Moderate episode of recurrent major depressive disorder ([place])    We had a shared decision making discussion regarding neck steps for treatment.  She would like to slowly taper off of her fluoxetine and increase Wellbutrin.  She is encouraged to take fluoxetine 1 capsule by mouth every other day x1 week and then stop the medication she was also encouraged to start 150 mg of extended release Wellbutrin daily.  She will follow-up in 4 to 6 weeks to see how she is tolerating the medication change.       Relevant Medications   buPROPion (WELLBUTRIN XL) 150 MG 24 hr tablet   FLUoxetine  (PROZAC) 10 MG capsule    Return in about 1 month (around 04/01/2022) for Follow-up with Donatella Walski (40 minutes multiple concerns).   Ailene Ards, NP

## 2022-03-02 NOTE — Assessment & Plan Note (Signed)
She may be a good candidate for pharmacological therapy.  Unfortunately her insurance does not seem to cover Wegovy or orlistat.  May consider phentermine at next office visit.

## 2022-03-02 NOTE — Assessment & Plan Note (Signed)
Blood work ordered for further evaluation, further recommendations may be made based upon the results.  We will consider referral to neurology for sleep apnea testing in the future if needed.

## 2022-03-02 NOTE — Assessment & Plan Note (Signed)
Blood work ordered today for further evaluation, further recommendations may be made based upon these results. 

## 2022-03-02 NOTE — Assessment & Plan Note (Signed)
Recommend evaluation with OB/GYN as well as assistance with management.  Patient is agreeable to this referral ordered today.

## 2022-03-02 NOTE — Assessment & Plan Note (Signed)
We had a shared decision making discussion regarding neck steps for treatment.  She would like to slowly taper off of her fluoxetine and increase Wellbutrin.  She is encouraged to take fluoxetine 1 capsule by mouth every other day x1 week and then stop the medication she was also encouraged to start 150 mg of extended release Wellbutrin daily.  She will follow-up in 4 to 6 weeks to see how she is tolerating the medication change.

## 2022-03-02 NOTE — Assessment & Plan Note (Signed)
Acute, patient encouraged to consider using Flonase nasal spray and her over-the-counter allergy pill.  Symptoms should resolve spontaneously on their own over time.  Patient encouraged let me know if symptoms persist.

## 2022-03-03 ENCOUNTER — Ambulatory Visit: Payer: Medicaid Other | Admitting: Nurse Practitioner

## 2022-03-05 ENCOUNTER — Ambulatory Visit: Payer: Medicaid Other | Admitting: Nurse Practitioner

## 2022-03-13 ENCOUNTER — Encounter: Payer: Self-pay | Admitting: Plastic Surgery

## 2022-03-13 ENCOUNTER — Ambulatory Visit: Payer: Medicaid Other | Admitting: Plastic Surgery

## 2022-03-13 VITALS — BP 118/76 | HR 72 | Ht 64.0 in | Wt 183.0 lb

## 2022-03-13 DIAGNOSIS — M5126 Other intervertebral disc displacement, lumbar region: Secondary | ICD-10-CM | POA: Diagnosis not present

## 2022-03-13 DIAGNOSIS — M542 Cervicalgia: Secondary | ICD-10-CM | POA: Insufficient documentation

## 2022-03-13 DIAGNOSIS — N62 Hypertrophy of breast: Secondary | ICD-10-CM | POA: Insufficient documentation

## 2022-03-13 DIAGNOSIS — M546 Pain in thoracic spine: Secondary | ICD-10-CM

## 2022-03-13 DIAGNOSIS — Z6831 Body mass index (BMI) 31.0-31.9, adult: Secondary | ICD-10-CM

## 2022-03-13 DIAGNOSIS — R21 Rash and other nonspecific skin eruption: Secondary | ICD-10-CM

## 2022-03-13 DIAGNOSIS — Z720 Tobacco use: Secondary | ICD-10-CM

## 2022-03-13 DIAGNOSIS — G8929 Other chronic pain: Secondary | ICD-10-CM

## 2022-03-13 NOTE — Progress Notes (Signed)
Patient ID: Lindsey Sampson, female    DOB: 11/07/1980, 41 y.o.   MRN: [id number]   Chief Complaint  Patient presents with   Advice Only    Mammary Hyperplasia: The patient is a 41 y.o. female with a history of mammary hyperplasia for several years.  She has extremely large breasts causing symptoms that include the following: Back pain in the upper and lower back, including neck pain. She pulls or pins her bra straps to provide better lift and relief of the pressure and pain. She notices relief by holding her breast up manually.  Her shoulder straps cause grooves and pain and pressure that requires padding for relief. Pain medication is sometimes required with motrin and tylenol.  Activities that are hindered by enlarged breasts include: exercise and running.  She has tried supportive clothing as well as fitted bras without improvement.  Her breasts are extremely large and fairly symmetric.  She has hyperpigmentation of the inframammary area on both sides.  The sternal to nipple distance on the right is 33 cm and the left is 34 cm.  The IMF distance is 18 cm.  She is 5 feet 4 inches tall and weighs 183 pounds.  The BMI = 31.4 kg/m.  Preoperative bra size = DD/DDD cup. She would like to be a C cup. The estimated excess breast tissue to be removed at the time of surgery = 550 grams on the left and 550 grams on the right.  Mammogram history: 2023 and negative.  Family history of breast cancer:  no.  Tobacco use:  yes and trying to stop.   The patient expresses the desire to pursue surgical intervention. She is currently getting PT.    Review of Systems  Constitutional:  Positive for activity change. Negative for appetite change.  HENT: Negative.    Eyes: Negative.   Respiratory:  Negative for chest tightness and shortness of breath.   Cardiovascular:  Negative for leg swelling.  Genitourinary: Negative.   Musculoskeletal:  Positive for back pain and neck pain.  Skin:  Positive for rash.   Hematological: Negative.   Psychiatric/Behavioral: Negative.      Past Medical History:  Diagnosis Date   Anxiety    Colitis    Cyst of spleen    Depression    Ovarian cyst    Renal disorder    renal cyst,     Past Surgical History:  Procedure Laterality Date   BACK SURGERY     BIOPSY  11/10/2019   Procedure: BIOPSY;  Surgeon: Danie Binder, MD;  [facility];  Service: Endoscopy;;   CESAREAN SECTION     CESAREAN SECTION  2006 and 2010   cyst removed from ovary Right    ESOPHAGOGASTRODUODENOSCOPY N/A 11/10/2019   Procedure: ESOPHAGOGASTRODUODENOSCOPY (EGD);  Surgeon: Danie Binder, MD;  [facility];  Service: Endoscopy;  Laterality: N/A;  12:00pm   LUMBAR LAMINECTOMY/DECOMPRESSION MICRODISCECTOMY Left 05/14/2016   Procedure: LUMBAR DECOMPRESSION/MICRODISCECTOMY LEFT LUMBAR FOUR-FIVE;  Surgeon: Melina Schools, MD;  Location: [place];  Service: Orthopedics;  Laterality: Left;   ORIF TOE FRACTURE Right 09/07/2019   Procedure: IRRIGATION AND DEBRIDEMENT AND OPEN REDUCTION INTERNAL FIXATION (ORIF) Proximal phalanx FRACTURE;  Surgeon: Shona Needles, MD;  Location: [place];  Service: Orthopedics;  Laterality: Right;   ORIF TOE FRACTURE Right 04/03/2020   Procedure: REPAIR OF RIGHT GREAT TOE NONUNION  WITH BONE GRAFTING RIGHT HIP;  Surgeon: Shona Needles, MD;  Location[facility];  Service: Orthopedics;  Laterality: Right;   TUBAL LIGATION        Current Outpatient Medications:    acetaminophen (TYLENOL) 500 MG tablet, Take 500 mg by mouth every 6 (six) hours as needed for mild pain., Disp: , Rfl:    buPROPion (WELLBUTRIN XL) 150 MG 24 hr tablet, Take 1 tablet (150 mg total) by mouth daily., Disp: 30 tablet, Rfl: 2   celecoxib (CELEBREX) 200 MG capsule, celecoxib 200 mg capsule  TAKE 1 CAPSULE BY MOUTH TWICE DAILY AS NEEDED FOR 15 DAYS, Disp: , Rfl:    fluticasone (FLONASE) 50 MCG/ACT nasal spray, Place 2 sprays into both nostrils daily., Disp: 16 g, Rfl: 6    metoCLOPramide (REGLAN) 10 MG tablet, Take 1 tablet (10 mg total) by mouth every 6 (six) hours as needed for nausea., Disp: 20 tablet, Rfl: 0   ondansetron (ZOFRAN) 8 MG tablet, Take 8 mg by mouth 3 (three) times daily as needed., Disp: , Rfl:    ondansetron (ZOFRAN-ODT) 8 MG disintegrating tablet, Take 1 tablet (8 mg total) by mouth every 8 (eight) hours as needed for nausea or vomiting., Disp: 20 tablet, Rfl: 0   pantoprazole (PROTONIX) 40 MG tablet, Take 1 tablet (40 mg total) by mouth daily as needed (Heartburn)., Disp: 30 tablet, Rfl: 1   Objective:   Vitals:   03/13/22 0902  BP: 118/76  Pulse: 72  SpO2: 97%    Physical Exam Vitals reviewed.  Constitutional:      Appearance: Normal appearance.  HENT:     Head: Normocephalic and atraumatic.  Cardiovascular:     Rate and Rhythm: Normal rate.     Pulses: Normal pulses.  Pulmonary:     Effort: Pulmonary effort is normal.  Abdominal:     General: There is no distension.     Palpations: Abdomen is soft.  Musculoskeletal:        General: No swelling or deformity.  Skin:    General: Skin is warm.     Capillary Refill: Capillary refill takes less than 2 seconds.     Coloration: Skin is not jaundiced.     Findings: Rash present. No bruising or lesion.  Neurological:     Mental Status: She is alert and oriented to person, place, and time.  Psychiatric:        Mood and Affect: Mood normal.        Behavior: Behavior normal.        Thought Content: Thought content normal.        Judgment: Judgment normal.     Assessment & Plan:  Lumbar disc herniation  Symptomatic mammary hypertrophy  Neck pain  Chronic bilateral thoracic back pain  The procedure the patient selected and that was best for the patient was discussed. The risk were discussed and include but not limited to the following:  Breast asymmetry, fluid accumulation, firmness of the breast, inability to breast feed, loss of nipple or areola, skin loss, change in skin  and nipple sensation, fat necrosis of the breast tissue, bleeding, infection and healing delay.  There are risks of anesthesia and injury to nerves or blood vessels.  Allergic reaction to tape, suture and skin glue are possible.  There will be swelling.  Any of these can lead to the need for revisional surgery.  A breast reduction has potential to interfere with diagnostic procedures in the future.  This procedure is best done when the breast is fully developed.  Changes in the breast  will continue to occur over time: pregnancy, weight gain or weigh loss.    Total time: 40 minutes. This includes time spent with the patient during the visit as well as time spent before and after the visit reviewing the chart, documenting the encounter, ordering pertinent studies and literature for the patient.   Physical therapy:  doing PT now Mammogram:  done  The patient is a good candidate for bilateral breast reduction with possible liposuction.  She is going to work on quitting smoking and finished her physical therapy.  She will come back and see [place] in 6 weeks.  Pictures were obtained of the patient and placed in the chart with the patient's or guardian's permission.   [place], DO

## 2022-03-16 DIAGNOSIS — M5451 Vertebrogenic low back pain: Secondary | ICD-10-CM | POA: Diagnosis not present

## 2022-04-02 ENCOUNTER — Ambulatory Visit: Payer: Medicaid Other | Admitting: Nurse Practitioner

## 2022-04-02 VITALS — BP 120/76 | HR 65 | Temp 98.6°F | Ht 64.0 in | Wt 179.0 lb

## 2022-04-02 DIAGNOSIS — F331 Major depressive disorder, recurrent, moderate: Secondary | ICD-10-CM | POA: Diagnosis not present

## 2022-04-02 DIAGNOSIS — Z6830 Body mass index (BMI) 30.0-30.9, adult: Secondary | ICD-10-CM | POA: Diagnosis not present

## 2022-04-02 DIAGNOSIS — M533 Sacrococcygeal disorders, not elsewhere classified: Secondary | ICD-10-CM | POA: Diagnosis not present

## 2022-04-02 DIAGNOSIS — E669 Obesity, unspecified: Secondary | ICD-10-CM | POA: Diagnosis not present

## 2022-04-02 MED ORDER — PHENTERMINE HCL 15 MG PO CAPS: 15.0000 mg | ORAL_CAPSULE | ORAL | 0 refills | Status: DC

## 2022-04-02 NOTE — Assessment & Plan Note (Addendum)
Chronic, stable.  Patient will continue on Wellbutrin '150mg'$ /day as currently prescribed.  PHQ-9 score is slightly improved compared to last visit.

## 2022-04-02 NOTE — Assessment & Plan Note (Signed)
Chronic, patient has hyperlipidemia as well. Patient encouraged to focus on lifestyle measures as she has successfully lost about 4 pounds since last office visit. Per shared decision making patient would like to trial low-dose of phentermine to assist with weight loss.  EKG ordered today which showed normal sinus rhythm.  Patient warned regarding risk for hypertension, cardiac ischemia, and dependency.  Patient will follow-up in 1 month for blood pressure check as well as to see how she is tolerating medication.  Patient also told medication is to be prescribed for short-term use only.  She is agreeable to this. Offered dietitian referral, however patient refused due to busy work schedule.

## 2022-04-02 NOTE — Progress Notes (Signed)
Established Patient Office Visit  Subjective   Patient ID: Lindsey Sampson, female    DOB: 1981/05/18  Age: 41 y.o. MRN: [id number]  Chief Complaint  Patient presents with   32mofollow up    Medication change and weight loss    Depression: Patient has transitioned off of fluoxetine and has started Wellbutrin.  She is tolerating the Wellbutrin well, she denies suicidal ideation.  She is actively trying to lose weight and has lost about 4 pounds since her last office visit 1 month ago.  Obesity: She continues to try to lose weight, she has lost about 4 pound since her last office visit.  She is focusing on lifestyle changes and has stopped drinking sugary beverages and tries to increase water intake.  She would like to discuss pharmacological treatment for weight loss.  Her insurance does not cover semaglutide or orlistat currently.    Review of Systems  Eyes:  Negative for blurred vision.  Respiratory:  Negative for shortness of breath.   Cardiovascular:  Negative for chest pain and palpitations.  Neurological:  Negative for headaches.  Psychiatric/Behavioral:  Positive for depression. Negative for suicidal ideas ((-) homicidal ideation). The patient does not have insomnia.       Objective:     BP 120/76 (BP Location: Right Arm, Patient Position: Sitting, Cuff Size: Large)   Pulse 65   Temp 98.6 F (37 C) (Oral)   Ht '5\' 4"'$  (1.626 m)   Wt 179 lb (81.2 kg)   SpO2 98%   BMI 30.73 kg/m  BP Readings from Last 3 Encounters:  04/02/22 120/76  03/13/22 118/76  03/02/22 118/78   Wt Readings from Last 3 Encounters:  04/02/22 179 lb (81.2 kg)  03/13/22 183 lb (83 kg)  03/02/22 183 lb (83 kg)        04/02/2022    9:15 AM 03/02/2022    9:57 AM  PHQ9 SCORE ONLY  PHQ-9 Total Score 5 6     Physical Exam Vitals reviewed.  Constitutional:      General: She is not in acute distress.    Appearance: Normal appearance.  HENT:     Head: Normocephalic and atraumatic.  Neck:      Vascular: No carotid bruit.  Cardiovascular:     Rate and Rhythm: Normal rate and regular rhythm.     Pulses: Normal pulses.     Heart sounds: Normal heart sounds.  Pulmonary:     Effort: Pulmonary effort is normal.     Breath sounds: Normal breath sounds.  Skin:    General: Skin is warm and dry.  Neurological:     General: No focal deficit present.     Mental Status: She is alert and oriented to person, place, and time.  Psychiatric:        Mood and Affect: Mood normal.        Behavior: Behavior normal.        Judgment: Judgment normal.      No results found for any visits on 04/02/22.     The 10-year ASCVD risk score (Arnett DK, et al., 2019) is: 4.1%    Assessment & Plan:   Problem List Items Addressed This Visit       Other   Moderate episode of recurrent major depressive disorder (HCC)    Chronic, stable.  Patient will continue on Wellbutrin '150mg'$ /day as currently prescribed.  PHQ-9 score is slightly improved compared to last visit.      Class  1 obesity without serious comorbidity with body mass index (BMI) of 30.0 to 30.9 in adult - Primary    Chronic, patient has hyperlipidemia as well. Patient encouraged to focus on lifestyle measures as she has successfully lost about 4 pounds since last office visit. Per shared decision making patient would like to trial low-dose of phentermine to assist with weight loss.  EKG ordered today which showed normal sinus rhythm.  Patient warned regarding risk for hypertension, cardiac ischemia, and dependency.  Patient will follow-up in 1 month for blood pressure check as well as to see how she is tolerating medication.  Patient also told medication is to be prescribed for short-term use only.  She is agreeable to this. Offered dietitian referral, however patient refused due to busy work schedule.      Relevant Medications   phentermine 15 MG capsule   Other Relevant Orders   EKG 12-Lead    Return in about 1 month (around  05/03/2022) for Follow-up with Sharonica Kraszewski.    Ailene Ards, NP

## 2022-04-24 ENCOUNTER — Ambulatory Visit: Payer: Medicaid Other | Admitting: Student

## 2022-04-24 VITALS — Wt 178.2 lb

## 2022-04-24 DIAGNOSIS — F17210 Nicotine dependence, cigarettes, uncomplicated: Secondary | ICD-10-CM

## 2022-04-24 DIAGNOSIS — R21 Rash and other nonspecific skin eruption: Secondary | ICD-10-CM

## 2022-04-24 DIAGNOSIS — N62 Hypertrophy of breast: Secondary | ICD-10-CM | POA: Diagnosis not present

## 2022-04-24 DIAGNOSIS — M549 Dorsalgia, unspecified: Secondary | ICD-10-CM

## 2022-04-24 NOTE — Progress Notes (Signed)
   Referring Provider Ailene Ards, NP [place],  [place] [address]   CC: No chief complaint on file.     Lindsey Sampson is an 41 y.o. female.  HPI: Patient is a 41 y.o. year old female here for follow up after completing physical therapy for pain related to macromastia.   She was seen for initial consult by Dr. Marla Roe on 03/13/2022.  At that time, patient reported mammary hyperplasia for several years.  She complained of symptoms including back pain, neck pain and activities hindered by her enlarged breasts such as exercising and running.  Patient expressed interest in surgical intervention.  Patient reported her bra size was a DDD cup and she would like to be a C cup.  Patient reported that she was a smoker and was trying to stop.  Patient also reported she was going to physical therapy at that time.  Per patient's chart, patient had mammogram completed on 12/17/2021.  The results of this mammogram show no evidence of malignancy, BI-RADS Category 1: Negative.  Today, patient reports she is doing well.  She states she has almost completed physical therapy, and has 1 more session coming up.  Patient states that although she is going to physical therapy, she is still having back pain.  She does report she has intermittent rashes as well underneath her breast.  Patient denies any recent changes in her health.  She denies any fevers or chills.  Patient states that she has been working with her primary care doctor to stop smoking.  She states she is smoking 1 to 2 cigarettes a day currently.  Patient states she is working on quitting completely.  Patient states that she ideally would like her surgery to be in February.   Review of Systems General: Denies fevers or chills MSK: Endorses ongoing back and neck discomfort Skin: Reports intermittent rashes  Physical Exam    04/02/2022    9:16 AM 03/13/2022    9:02 AM 03/02/2022   10:02 AM  Vitals with BMI  Height '5\' 4"'$  '5\' 4"'$  5'  4"  Weight 179 lbs 183 lbs 183 lbs  BMI 30.71 46.9 62.9  Systolic 528 413 244  Diastolic 76 76 78  Pulse 65 72 80    General:  No acute distress,  Alert and oriented, Non-Toxic, Normal speech and affect Psych: Normal behavior and mood Respiratory: No increased WOB MSK: Ambulatory  Assessment/Plan  Patient is interested in pursuing surgical intervention for bilateral breast reduction. Patient has completed most of her sessions of physical therapy for pain related to macromastia.  I encouraged the patient to quit smoking.  I discussed with her that she will need to be tobacco free for at least 6 to 12 weeks prior to surgery.  Patient expressed understanding.  I discussed with the patient that I will discuss with her surgical scheduling team in terms of getting her scheduled for February.  Plan to see the patient for follow-up in 2 months to see where she is at in terms of quitting smoking and to discuss further details regarding her surgery.   I discussed with the patient that when we do submit for insurance  authorization, it can take up to 6 weeks to receive.  Clance Boll 04/24/2022, 7:26 AM

## 2022-04-28 ENCOUNTER — Encounter: Payer: Self-pay | Admitting: Emergency Medicine

## 2022-04-28 ENCOUNTER — Ambulatory Visit
Admission: EM | Admit: 2022-04-28 | Discharge: 2022-04-28 | Disposition: A | Discharge status: Home / Self Care | Payer: Medicaid Other | Attending: Nurse Practitioner | Admitting: Nurse Practitioner

## 2022-04-28 DIAGNOSIS — R42 Dizziness and giddiness: Secondary | ICD-10-CM | POA: Diagnosis not present

## 2022-04-28 DIAGNOSIS — R110 Nausea: Secondary | ICD-10-CM | POA: Diagnosis not present

## 2022-04-28 MED ORDER — ONDANSETRON 4 MG PO TBDP: 4.0000 mg | ORAL_TABLET | Freq: Three times a day (TID) | ORAL | 0 refills | Status: DC | PRN

## 2022-04-28 MED ORDER — MECLIZINE HCL 25 MG PO TABS: 25.0000 mg | ORAL_TABLET | Freq: Three times a day (TID) | ORAL | 0 refills | Status: DC | PRN

## 2022-04-28 MED ORDER — ONDANSETRON 4 MG PO TBDP
4.0000 mg | ORAL_TABLET | Freq: Once | ORAL | Status: AC
  Administered 2022-04-28: 4 mg via ORAL

## 2022-04-28 NOTE — ED Triage Notes (Signed)
Dizzy, lightheaded, nausea, mid stomach pain.  Stomach pain started yesterday and dizzy and lightheaded started today.  States she had chest pain earlier after vomiting, but that has went away.

## 2022-04-28 NOTE — ED Provider Notes (Signed)
[facility]    CSN: [id number] Arrival date & time: 04/28/22  1819      History   Chief Complaint No chief complaint on file.   HPI Lindsey Sampson is a 41 y.o. female.   The history is provided by the patient.   Patient presents for complaints of dizziness, lightheadedness, and nausea that started this morning upon awakening.  Patient also states that she had stomach pain that started yesterday along with some chest pain after vomiting, but that has since resolved.  Patient states "I feel like the room is spinning, why are you spinning".  Patient states that certain positions impacted the dizziness.  She denies headache, blurred vision, change in vision, loss of vision, vomiting, or diarrhea.  Patient also denies any recent ear infections or upper respiratory symptoms.  Patient states that she did go to an indoor water park 1 day ago with her son.  She denies keeping her head underwater or doing a lot of swimming because the water was not that deep.  Patient denies previous history of vertigo at this time.  Past Medical History:  Diagnosis Date   Anxiety    Colitis    Cyst of spleen    Depression    Ovarian cyst    Renal disorder    renal cyst,     Patient Active Problem List   Diagnosis Date Noted   Class 1 obesity without serious comorbidity with body mass index (BMI) of 30.0 to 30.9 in adult 04/02/2022   Symptomatic mammary hypertrophy 03/13/2022   Neck pain 03/13/2022   Abdominal pain 03/02/2022   Anxiety 03/02/2022   Dysmenorrhea 03/02/2022   GERD (gastroesophageal reflux disease) 03/02/2022   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/02/2022   Tobacco use 03/02/2022   Menorrhagia with regular cycle 03/02/2022   Bilateral serous otitis media 03/02/2022   Fatigue 03/02/2022   Screening for cardiovascular condition 03/02/2022   Screening for diabetes mellitus 03/02/2022   Moderate episode of recurrent major depressive disorder ([place]) 03/02/2022    Sacroiliac joint pain 01/09/2022   Disorder of right sacroiliac joint 01/05/2022   Back pain 12/04/2021   Open displaced fracture of proximal phalanx of right great toe 02/20/2020   Great toe pain, right 09/07/2019   Open displaced fracture of proximal phalanx of great toe 09/06/2019   Peptic ulcer disease 07/26/2019   Diarrhea 07/26/2019   Early satiety 07/26/2019   Liver lesion 07/26/2019   H/O tubal ligation 10/14/2018   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    Past Surgical History:  Procedure Laterality Date   BACK SURGERY     BIOPSY  11/10/2019   Procedure: BIOPSY;  Surgeon: Danie Binder, MD;  [facility];  Service: Endoscopy;;   CESAREAN SECTION     CESAREAN SECTION  2006 and 2010   cyst removed from ovary Right    ESOPHAGOGASTRODUODENOSCOPY N/A 11/10/2019   Procedure: ESOPHAGOGASTRODUODENOSCOPY (EGD);  Surgeon: Danie Binder, MD;  Location: [facility];  Service: Endoscopy;  Laterality: N/A;  12:00pm   LUMBAR LAMINECTOMY/DECOMPRESSION MICRODISCECTOMY Left 05/14/2016   Procedure: LUMBAR DECOMPRESSION/MICRODISCECTOMY LEFT LUMBAR FOUR-FIVE;  Surgeon: Melina Schools, MD;  Location: [facility];  Service: Orthopedics;  Laterality: Left;   ORIF TOE FRACTURE Right 09/07/2019   Procedure: IRRIGATION AND DEBRIDEMENT AND OPEN REDUCTION INTERNAL FIXATION (ORIF) Proximal phalanx FRACTURE;  Surgeon: Shona Needles, MD;  Location: [facility];  Service: Orthopedics;  Laterality: Right;   ORIF TOE FRACTURE Right 04/03/2020   Procedure: REPAIR  OF RIGHT GREAT TOE NONUNION  WITH BONE GRAFTING RIGHT HIP;  Surgeon: Shona Needles, MD;  Location: [facility];  Service: Orthopedics;  Laterality: Right;   TUBAL LIGATION      OB History     Gravida  2   Para      Term      Preterm      AB      Living  2      SAB      IAB      Ectopic      Multiple  0   Live Births  2            Home Medications    Prior to Admission medications   Medication Sig Start Date End Date Taking?  Authorizing Provider  meclizine (ANTIVERT) 25 MG tablet Take 1 tablet (25 mg total) by mouth 3 (three) times daily as needed for dizziness. 04/28/22  Yes Kareena Arrambide-Warren, Alda Lea, NP  ondansetron (ZOFRAN-ODT) 4 MG disintegrating tablet Take 1 tablet (4 mg total) by mouth every 8 (eight) hours as needed for nausea or vomiting. 04/28/22  Yes Tayte Childers-Warren, Alda Lea, NP  acetaminophen (TYLENOL) 500 MG tablet Take 500 mg by mouth every 6 (six) hours as needed for mild pain.    [provider]  buPROPion (WELLBUTRIN XL) 150 MG 24 hr tablet Take 1 tablet (150 mg total) by mouth daily. 03/02/22   Ailene Ards, NP  celecoxib (CELEBREX) 200 MG capsule celecoxib 200 mg capsule  TAKE 1 CAPSULE BY MOUTH TWICE DAILY AS NEEDED FOR 15 DAYS    [provider]  famotidine (PEPCID) 40 MG tablet Take 1 tablet twice a day by oral route before meals for 30 days. 04/02/22   [provider]  fluticasone (FLONASE) 50 MCG/ACT nasal spray Place 2 sprays into both nostrils daily. 03/02/22   Ailene Ards, NP  metoCLOPramide (REGLAN) 10 MG tablet Take 1 tablet (10 mg total) by mouth every 6 (six) hours as needed for nausea. 01/27/21   Scot Jun, FNP  ondansetron (ZOFRAN) 8 MG tablet Take 8 mg by mouth 3 (three) times daily as needed. 12/22/21   [provider]  pantoprazole (PROTONIX) 40 MG tablet Take 1 tablet (40 mg total) by mouth daily as needed (Heartburn). 03/02/22   Ailene Ards, NP  phentermine 15 MG capsule Take 1 capsule (15 mg total) by mouth every morning. 04/02/22   Ailene Ards, NP  clonazePAM (KLONOPIN) 0.5 MG tablet Take 0.5 mg by mouth 3 (three) times daily as needed. For anxiety/sleep. 03/12/16 03/29/19  [provider]    Family History Family History  Problem Relation Age of Onset   Diabetes Mother    Hypertension Mother    High Cholesterol Mother    Hypertension Father    Prostate cancer Father    High Cholesterol Father    Colon polyps Maternal Grandmother     Colon cancer Neg Hx     Social History Social History   Tobacco Use   Smoking status: Light Smoker    Packs/day: 0.50    Types: Cigarettes   Smokeless tobacco: Never   Tobacco comments:    trying to quit   Vaping Use   Vaping Use: Former  Substance Use Topics   Alcohol use: Yes    Comment: rare   Drug use: No     Allergies   Patient has no known allergies.   Review of Systems Review of  Systems Per HPI  Physical Exam Triage Vital Signs ED Triage Vitals  Enc Vitals Group     BP 04/28/22 1825 120/82     Pulse Rate 04/28/22 1825 79     Resp 04/28/22 1825 18     Temp 04/28/22 1825 98.2 F (36.8 C)     Temp Source 04/28/22 1825 Oral     SpO2 04/28/22 1825 99 %     Weight --      Height --      Head Circumference --      Peak Flow --      Pain Score 04/28/22 1827 8     Pain Loc --      Pain Edu? --      Excl. in [place]? --    No data found.  Updated Vital Signs BP 120/82 (BP Location: Right Arm)   Pulse 79   Temp 98.2 F (36.8 C) (Oral)   Resp 18   LMP 04/13/2022 (Approximate)   SpO2 99%   Visual Acuity Right Eye Distance:   Left Eye Distance:   Bilateral Distance:    Right Eye Near:   Left Eye Near:    Bilateral Near:     Physical Exam Vitals and nursing note reviewed.  Constitutional:      General: She is in acute distress (Appears uncomfortable due to dizziness.).     Appearance: Normal appearance.  HENT:     Head: Normocephalic.     Right Ear: Tympanic membrane, ear canal and external ear normal.     Left Ear: Tympanic membrane, ear canal and external ear normal.     Nose: Nose normal.     Mouth/Throat:     Mouth: Mucous membranes are moist.  Eyes:     Extraocular Movements: Extraocular movements intact.     Conjunctiva/sclera: Conjunctivae normal.     Pupils: Pupils are equal, round, and reactive to light.  Cardiovascular:     Rate and Rhythm: Normal rate and regular rhythm.     Pulses: Normal pulses.     Heart sounds: Normal heart  sounds.  Pulmonary:     Effort: Pulmonary effort is normal.     Breath sounds: Normal breath sounds.  Abdominal:     General: Bowel sounds are normal.     Palpations: Abdomen is soft.     Tenderness: There is no abdominal tenderness.  Musculoskeletal:     Cervical back: Normal range of motion.  Skin:    General: Skin is warm and dry.  Neurological:     General: No focal deficit present.     Mental Status: She is alert and oriented to person, place, and time.     GCS: GCS eye subscore is 4. GCS verbal subscore is 5. GCS motor subscore is 6.     Cranial Nerves: Cranial nerves 2-12 are intact.     Sensory: Sensation is intact.     Motor: No weakness.     Coordination: Coordination is intact.  Psychiatric:        Mood and Affect: Mood normal.        Behavior: Behavior normal.      UC Treatments / Results  Labs (all labs ordered are listed, but only abnormal results are displayed) Labs Reviewed - No data to display  EKG   Radiology No results found.  Procedures Procedures (including critical care time)  Medications Ordered in UC Medications  ondansetron (ZOFRAN-ODT) disintegrating tablet 4 mg (4 mg Oral Given 04/28/22  1851)    Initial Impression / Assessment and Plan / UC Course  I have reviewed the triage vital signs and the nursing notes.  Pertinent labs & imaging results that were available during my care of the patient were reviewed by me and considered in my medical decision making (see chart for details).  Patient notes for complaints of dizziness that started today upon wakening.  She also complains of nausea.  Patient states dizziness worsens with certain positions or certain movement.  On exam, patient's vital signs are stable, she does appear uncomfortable due to the dizziness and lightheadedness.  Her exam is otherwise benign.  Cannot determine a true etiology of the patient's symptoms, although her symptoms do coincide with vertigo.  Patient was prescribed  meclizine and ondansetron for her symptoms.  Discussion with patient regarding increasing fluids and allowing for plenty of rest.  Patient was advised that if her symptoms do not improve within the next 12 to 24 hours, she should go to the emergency department immediately for further evaluation.  Patient verbalizes understanding.  All questions were answered. Final Clinical Impressions(s) / UC Diagnoses   Final diagnoses:  Dizziness and giddiness  Nausea without vomiting     Discharge Instructions      Take medication as prescribed. Increase fluids and allow for plenty of rest.  To drink at least 6-8 8 ounce glasses of water daily. Go to the emergency department within the next 12 to 24 hours if your symptoms do not improve or if you develop worsening dizziness, lightheadedness, inability to stand or walk, nausea or vomiting that you cannot control, or other concerns.     ED Prescriptions     Medication Sig Dispense Auth. Provider   meclizine (ANTIVERT) 25 MG tablet Take 1 tablet (25 mg total) by mouth 3 (three) times daily as needed for dizziness. 30 tablet Charleigh Correnti-Warren, Alda Lea, NP   ondansetron (ZOFRAN-ODT) 4 MG disintegrating tablet Take 1 tablet (4 mg total) by mouth every 8 (eight) hours as needed for nausea or vomiting. 20 tablet Skylar Priest-Warren, Alda Lea, NP      PDMP not reviewed this encounter.   Tish Men, NP 04/28/22 [phone]

## 2022-04-28 NOTE — Discharge Instructions (Signed)
Take medication as prescribed. Increase fluids and allow for plenty of rest.  To drink at least 6-8 8 ounce glasses of water daily. Go to the emergency department within the next 12 to 24 hours if your symptoms do not improve or if you develop worsening dizziness, lightheadedness, inability to stand or walk, nausea or vomiting that you cannot control, or other concerns.

## 2022-04-30 ENCOUNTER — Other Ambulatory Visit: Payer: Self-pay | Admitting: Nurse Practitioner

## 2022-04-30 ENCOUNTER — Encounter: Payer: Self-pay | Admitting: Nurse Practitioner

## 2022-04-30 DIAGNOSIS — E669 Obesity, unspecified: Secondary | ICD-10-CM

## 2022-05-01 MED ORDER — PHENTERMINE HCL 15 MG PO CAPS: 15.0000 mg | ORAL_CAPSULE | ORAL | 0 refills | Status: DC

## 2022-05-04 ENCOUNTER — Telehealth: Payer: Self-pay | Admitting: Student

## 2022-05-04 NOTE — Telephone Encounter (Signed)
Pt is returning the call to [facility] she is thinking that the call is in reference to her insurance and she is getting ready to have new insurance Scientist, clinical (histocompatibility and immunogenetics)) on May 29, 2022.   Pt would like to have a call back.

## 2022-05-08 ENCOUNTER — Ambulatory Visit: Payer: Medicaid Other | Admitting: Nurse Practitioner

## 2022-05-08 ENCOUNTER — Encounter: Payer: Self-pay | Admitting: Nurse Practitioner

## 2022-05-08 VITALS — BP 122/78 | HR 78 | Temp 98.3°F | Ht 64.0 in | Wt 174.0 lb

## 2022-05-08 DIAGNOSIS — E669 Obesity, unspecified: Secondary | ICD-10-CM

## 2022-05-08 DIAGNOSIS — F331 Major depressive disorder, recurrent, moderate: Secondary | ICD-10-CM

## 2022-05-08 DIAGNOSIS — M5451 Vertebrogenic low back pain: Secondary | ICD-10-CM | POA: Diagnosis not present

## 2022-05-08 DIAGNOSIS — Z6830 Body mass index (BMI) 30.0-30.9, adult: Secondary | ICD-10-CM

## 2022-05-08 DIAGNOSIS — H8110 Benign paroxysmal vertigo, unspecified ear: Secondary | ICD-10-CM | POA: Diagnosis not present

## 2022-05-08 HISTORY — DX: Benign paroxysmal vertigo, unspecified ear: H81.10

## 2022-05-08 MED ORDER — ONDANSETRON HCL 8 MG PO TABS: 8.0000 mg | ORAL_TABLET | Freq: Three times a day (TID) | ORAL | 2 refills | Status: DC | PRN

## 2022-05-08 MED ORDER — BUPROPION HCL ER (XL) 300 MG PO TB24: 300.0000 mg | ORAL_TABLET | Freq: Every day | ORAL | 1 refills | Status: DC

## 2022-05-08 NOTE — Assessment & Plan Note (Signed)
Chronic, patient tolerating phentermine currently.  Blood pressure remains in normal range.  No chest pain or shortness of breath.  Per shared decision making patient like to focus on mood as opposed to just phentermine dosage for now.  Thus, patient will continue on 15 mg mouth daily we will discuss dosage adjustment in the near future as needed.

## 2022-05-08 NOTE — Progress Notes (Signed)
Established Patient Office Visit  Subjective   Patient ID: Lindsey Sampson, female    DOB: July 30, 1981  Age: 41 y.o. MRN: [id number]  Chief Complaint  Patient presents with   22mofollow up    Patient has no concerns or questions    Obesity: On phentermine 50 mg by mouth daily.  Has lost approximately 5 pounds.  Tolerating well.  Anxiety/depression: Change from fluoxetine to Wellbutrin.  Currently on 150 mg Wellbutrin daily.  Tolerating well but does report some irritability and anxiety.  Denies suicidal ideation.  Would like to discuss dosage adjustment.  Of note, recently seen in urgent care for vertigo.  Reports vertigo has now completely resolved, prior to onset had been at water park and was on water slides.  Feels this may have been a trigger.  Would like refill of Zofran to take as needed in case of vertigo returns.    Review of Systems  Eyes:  Negative for blurred vision.  Respiratory:  Negative for shortness of breath.   Cardiovascular:  Negative for chest pain.  Neurological:  Negative for headaches.  Psychiatric/Behavioral:  Negative for suicidal ideas.       Objective:     BP 122/78 (BP Location: Right Arm, Patient Position: Sitting, Cuff Size: Large)   Pulse 78   Temp 98.3 F (36.8 C) (Oral)   Ht '5\' 4"'$  (1.626 m)   Wt 174 lb (78.9 kg)   LMP 04/13/2022 (Approximate)   SpO2 95%   BMI 29.87 kg/m  BP Readings from Last 3 Encounters:  05/08/22 122/78  04/28/22 120/82  04/02/22 120/76   Wt Readings from Last 3 Encounters:  05/08/22 174 lb (78.9 kg)  04/24/22 178 lb 3.2 oz (80.8 kg)  04/02/22 179 lb (81.2 kg)      Physical Exam Vitals reviewed.  Constitutional:      General: She is not in acute distress.    Appearance: Normal appearance.  HENT:     Head: Normocephalic and atraumatic.  Neck:     Vascular: No carotid bruit.  Cardiovascular:     Rate and Rhythm: Normal rate and regular rhythm.     Pulses: Normal pulses.     Heart sounds: Normal heart  sounds.  Pulmonary:     Effort: Pulmonary effort is normal.     Breath sounds: Normal breath sounds.  Skin:    General: Skin is warm and dry.  Neurological:     General: No focal deficit present.     Mental Status: She is alert and oriented to person, place, and time.  Psychiatric:        Mood and Affect: Mood normal.        Behavior: Behavior normal.        Judgment: Judgment normal.      No results found for any visits on 05/08/22.    The 10-year ASCVD risk score (Arnett DK, et al., 2019) is: 4.3%    Assessment & Plan:   Problem List Items Addressed This Visit       Nervous and Auditory   RESOLVED: Benign paroxysmal positional vertigo    Seems to have resolved at this time.  Refill for Zofran sent to patient's pharmacy to have on hand as needed.  Also encouraged her to use meclizine as needed recurrent symptoms.  Patient told to notify me if she has questions or if symptoms return and increased in severity.      Relevant Medications   ondansetron (ZOFRAN) 8 MG  tablet     Other   Moderate episode of recurrent major depressive disorder (HCC)    Chronic, suboptimally controlled.  Per shared decision making patient would like to increase Wellbutrin to 300 mg/day.  New prescription sent to pharmacy.  Patient was warned of risk of increased anxiety and irritability with increasing dose of Wellbutrin and to notify me if these were to occur.  May consider returning back to fluoxetine if this happens.  Also may consider addition of BuSpar.      Relevant Medications   buPROPion (WELLBUTRIN XL) 300 MG 24 hr tablet   Class 1 obesity without serious comorbidity with body mass index (BMI) of 30.0 to 30.9 in adult - Primary    Chronic, patient tolerating phentermine currently.  Blood pressure remains in normal range.  No chest pain or shortness of breath.  Per shared decision making patient like to focus on mood as opposed to just phentermine dosage for now.  Thus, patient will  continue on 15 mg mouth daily we will discuss dosage adjustment in the near future as needed.       Return in about 4 weeks (around 06/05/2022) for F/U tele visit with Judson Roch.    Ailene Ards, NP

## 2022-05-08 NOTE — Assessment & Plan Note (Signed)
Chronic, suboptimally controlled.  Per shared decision making patient would like to increase Wellbutrin to 300 mg/day.  New prescription sent to pharmacy.  Patient was warned of risk of increased anxiety and irritability with increasing dose of Wellbutrin and to notify me if these were to occur.  May consider returning back to fluoxetine if this happens.  Also may consider addition of BuSpar.

## 2022-05-08 NOTE — Assessment & Plan Note (Signed)
Seems to have resolved at this time.  Refill for Zofran sent to patient's pharmacy to have on hand as needed.  Also encouraged her to use meclizine as needed recurrent symptoms.  Patient told to notify me if she has questions or if symptoms return and increased in severity.

## 2022-05-29 DIAGNOSIS — Z419 Encounter for procedure for purposes other than remedying health state, unspecified: Secondary | ICD-10-CM | POA: Diagnosis not present

## 2022-06-02 ENCOUNTER — Encounter: Payer: Self-pay | Admitting: Nurse Practitioner

## 2022-06-05 ENCOUNTER — Telehealth (INDEPENDENT_AMBULATORY_CARE_PROVIDER_SITE_OTHER): Payer: Medicaid Other | Admitting: Nurse Practitioner

## 2022-06-05 ENCOUNTER — Encounter: Payer: Self-pay | Admitting: Nurse Practitioner

## 2022-06-05 VITALS — BP 130/81 | Ht 64.0 in | Wt 174.2 lb

## 2022-06-05 DIAGNOSIS — F331 Major depressive disorder, recurrent, moderate: Secondary | ICD-10-CM | POA: Diagnosis not present

## 2022-06-05 DIAGNOSIS — N920 Excessive and frequent menstruation with regular cycle: Secondary | ICD-10-CM

## 2022-06-05 DIAGNOSIS — Z6830 Body mass index (BMI) 30.0-30.9, adult: Secondary | ICD-10-CM | POA: Diagnosis not present

## 2022-06-05 DIAGNOSIS — E669 Obesity, unspecified: Secondary | ICD-10-CM | POA: Diagnosis not present

## 2022-06-05 MED ORDER — BUPROPION HCL ER (XL) 150 MG PO TB24: 150.0000 mg | ORAL_TABLET | Freq: Every day | ORAL | 1 refills | Status: DC

## 2022-06-05 MED ORDER — ESCITALOPRAM OXALATE 5 MG PO TABS: 5.0000 mg | ORAL_TABLET | Freq: Every day | ORAL | 2 refills | Status: DC

## 2022-06-05 MED ORDER — PHENTERMINE HCL 15 MG PO CAPS: 15.0000 mg | ORAL_CAPSULE | ORAL | 0 refills | Status: DC

## 2022-06-05 NOTE — Progress Notes (Signed)
   Established Patient Office Visit  An audio-only tele-health visit was completed today for this patient. I connected with  Lindsey Sampson on 06/05/22 utilizing audio-only technology and verified that I am speaking with the correct person using two identifiers. The patient was located at their home, and I was located at the office of [place] at [facility] during the encounter. I discussed the limitations of evaluation and management by telemedicine. The patient expressed understanding and agreed to proceed.     Subjective   Patient ID: Lindsey Sampson, female    DOB: 1981-03-23  Age: 41 y.o. MRN: [id number]  Chief Complaint  Patient presents with   Depression   Obesity    Obesity: On phentermine '15mg'$ /day, tolerating well but feels that effectiveness has diminished.   Depression/Anxiety: Increased wellbutrin to '300mg'$ /day at last OV. Tolerating well, but feels more irritable, no suicidal ideation.   Menorrhagia: with normal cycle. Referred to OBGYN for evaluation, but requesting a new referral because last office did not have an opening for a few weeks.     Review of Systems  Constitutional:  Negative for malaise/fatigue.  Respiratory:  Negative for cough and shortness of breath.   Cardiovascular:  Negative for chest pain and palpitations.  Psychiatric/Behavioral:  Negative for suicidal ideas. The patient is nervous/anxious.       Objective:     BP 130/81   Ht '5\' 4"'$  (1.626 m)   Wt 174 lb 3.2 oz (79 kg)   BMI 29.90 kg/m  BP Readings from Last 3 Encounters:  06/05/22 130/81  05/08/22 122/78  04/28/22 120/82   Wt Readings from Last 3 Encounters:  06/05/22 174 lb 3.2 oz (79 kg)  05/08/22 174 lb (78.9 kg)  04/24/22 178 lb 3.2 oz (80.8 kg)      Physical Exam Comprehensive physical exam not completed today as office visit was conducted remotely.  Patient sounded well over the phone.  Patient was alert and oriented, and appeared to have appropriate  judgment.   No results found for any visits on 06/05/22.    The 10-year ASCVD risk score (Arnett DK, et al., 2019) is: 4.8%    Assessment & Plan:   Problem List Items Addressed This Visit       Other   Menorrhagia with regular cycle    Referred to [facility].      Relevant Orders   Ambulatory referral to Obstetrics / Gynecology   Moderate episode of recurrent major depressive disorder ([place]) - Primary    Chronic, slightly worse with increase in wellbutrin dose. Will change wellbutrin to '150mg'$ /day, then add lexapro '5mg'$ /day. Patient to f/u in 4-6 weeks to monitor response. Patient warned of s/s of serotonin syndrome and told to proceed to ER if these occur.       Relevant Medications   buPROPion (WELLBUTRIN XL) 150 MG 24 hr tablet   escitalopram (LEXAPRO) 5 MG tablet   Class 1 obesity without serious comorbidity with body mass index (BMI) of 30.0 to 30.9 in adult    Patient to continue focusing on lifestyle management. Prefer to stabilize mood prior to increasing dose phentermine, patient agreeable. Continue on phentermine '15mg'$ /day.      Relevant Medications   phentermine 15 MG capsule    Return in about 4 weeks (around 07/03/2022). Total time on phone was 14 minutes.    Ailene Ards, NP

## 2022-06-05 NOTE — Assessment & Plan Note (Signed)
Referred to [facility].

## 2022-06-05 NOTE — Assessment & Plan Note (Signed)
Patient to continue focusing on lifestyle management. Prefer to stabilize mood prior to increasing dose phentermine, patient agreeable. Continue on phentermine '15mg'$ /day.

## 2022-06-05 NOTE — Assessment & Plan Note (Signed)
Chronic, slightly worse with increase in wellbutrin dose. Will change wellbutrin to '150mg'$ /day, then add lexapro '5mg'$ /day. Patient to f/u in 4-6 weeks to monitor response. Patient warned of s/s of serotonin syndrome and told to proceed to ER if these occur.

## 2022-06-25 ENCOUNTER — Ambulatory Visit (INDEPENDENT_AMBULATORY_CARE_PROVIDER_SITE_OTHER): Payer: 59 | Admitting: Student

## 2022-06-25 DIAGNOSIS — N62 Hypertrophy of breast: Secondary | ICD-10-CM | POA: Diagnosis not present

## 2022-06-25 DIAGNOSIS — M549 Dorsalgia, unspecified: Secondary | ICD-10-CM

## 2022-06-25 NOTE — Progress Notes (Addendum)
   Referring Provider Ailene Ards, NP [place],  [place] [address]   CC: No chief complaint on file.     Lindsey Sampson is an 41 y.o. female.  HPI: Patient is a 41 year old female with history of macromastia.  Patient was seen by Dr. Marla Roe on 03/13/2022 for consult for breast reduction.  Patient expressed interest in surgical intervention.  Physical therapy was ordered for the patient.  Patient also reported that she was a smoker.  Patient had a follow-up visit in the clinic on 04/24/2022.  At this visit, patient reported she is doing well.  She stated that she almost completed all of the physical therapy, and had 1 more session.  She stated that she still was having some back pain and rashes underneath her breast.  Patient states that she was still smoking 1 to 2 cigarettes a day.  She states that she was working on quitting completely.  Plan was for patient to continue to try to stop smoking and to follow-up with how she was doing after 2 months.  Today, patient reports she is doing well.  She states that she has completely finished physical therapy and is still having daily back pain.  She states that she also has intermittent rashes underneath her breasts.  Patient states that she is still interested in pursuing surgical intervention.  Do not see any any  Patient reports that she quit smoking and has not smoked since July 30.  She states that she has not been using any nicotine products whatsoever.  Patient also reports that she has been working on losing weight and is now 170 pounds.  Review of Systems General: Denies recent changes in her health.  Denies any recent fevers, chills or shortness of breath.  Physical Exam  No signs of distress and patient speech. Patient is speaking in full clear sentences.  Assessment/Plan  Symptomatic mammary hypertrophy   Patient is interested in pursuing surgical intervention for bilateral breast reduction. Patient has completed  at least 6 weeks of physical therapy for pain related to macromastia.  I discussed with the patient to continue to not smoke.  Patient expressed understanding.  Discussed with patient we would submit to insurance for authorization, discussed approval could take up to 6 weeks.   I instructed the patient to call if she has any questions or concerns in the meantime.  The patient gave consent to have this visit done by telemedicine / virtual visit, two identifiers were used to identify patient. This is also consent for access the chart and treat the patient via this visit. The patient is located Home.  I, the provider, am at the office.  We spent 7 minutes together for the visit.  Joined by telephone.   Clance Boll 06/25/2022, 1:19 PM

## 2022-06-28 DIAGNOSIS — Z419 Encounter for procedure for purposes other than remedying health state, unspecified: Secondary | ICD-10-CM | POA: Diagnosis not present

## 2022-07-01 DIAGNOSIS — Z01419 Encounter for gynecological examination (general) (routine) without abnormal findings: Secondary | ICD-10-CM | POA: Diagnosis not present

## 2022-07-01 DIAGNOSIS — Z6828 Body mass index (BMI) 28.0-28.9, adult: Secondary | ICD-10-CM | POA: Diagnosis not present

## 2022-07-01 DIAGNOSIS — N939 Abnormal uterine and vaginal bleeding, unspecified: Secondary | ICD-10-CM | POA: Diagnosis not present

## 2022-07-01 LAB — CYTOLOGY - PAP

## 2022-07-03 ENCOUNTER — Telehealth (INDEPENDENT_AMBULATORY_CARE_PROVIDER_SITE_OTHER): Payer: 59 | Admitting: Nurse Practitioner

## 2022-07-03 VITALS — Ht 64.0 in | Wt 168.2 lb

## 2022-07-03 DIAGNOSIS — Z6830 Body mass index (BMI) 30.0-30.9, adult: Secondary | ICD-10-CM

## 2022-07-03 DIAGNOSIS — F331 Major depressive disorder, recurrent, moderate: Secondary | ICD-10-CM

## 2022-07-03 DIAGNOSIS — E669 Obesity, unspecified: Secondary | ICD-10-CM

## 2022-07-03 DIAGNOSIS — R69 Illness, unspecified: Secondary | ICD-10-CM | POA: Diagnosis not present

## 2022-07-03 MED ORDER — PHENTERMINE HCL 30 MG PO CAPS: 30.0000 mg | ORAL_CAPSULE | ORAL | 0 refills | Status: DC

## 2022-07-03 NOTE — Progress Notes (Signed)
Established Patient Office Visit  An audio-only tele-health visit was completed today for this patient. I connected with  [name] on 07/03/22 utilizing audio-only technology and verified that I am speaking with the correct person using two identifiers. The patient was located at their car, and I was located at the office of [place] at [facility] during the encounter. I discussed the limitations of evaluation and management by telemedicine. The patient expressed understanding and agreed to proceed.     Subjective   Patient ID: Lindsey Sampson, female    DOB: 01/13/1981  Age: 41 y.o. MRN: [id number]  Chief Complaint  Patient presents with   Follow-up    About medication follow up      Anxiety/Depression: On Wellbutrin 150 mg daily, added Lexapro 5 mg/day due to concerns with worsening anxiety and irritability.  She had originally tried Wellbutrin 300 daily but this exacerbated irritability.  Today she reports that she is doing very well on current regimen.  She feels anxiety and depression are much improved.  She denies suicidal ideation.  Obesity: Continues on phentermine 15 mg daily, has been on this for approximately 3 months.  Has been losing weight.  Denies chest pain, headache, blurry vision, shortness of breath.    Review of Systems  Eyes:  Negative for blurred vision.  Respiratory:  Negative for shortness of breath.   Cardiovascular:  Negative for chest pain.  Gastrointestinal:  Negative for nausea.  Psychiatric/Behavioral:  Negative for depression, hallucinations and suicidal ideas. The patient is not nervous/anxious.       Objective:     Ht '5\' 4"'$  (1.626 m)   Wt 168 lb 3 oz (76.3 kg)   BMI 28.87 kg/m  BP Readings from Last 3 Encounters:  06/05/22 130/81  05/08/22 122/78  04/28/22 120/82   Wt Readings from Last 3 Encounters:  07/03/22 168 lb 3 oz (76.3 kg)  06/05/22 174 lb 3.2 oz (79 kg)  05/08/22 174 lb (78.9 kg)         07/03/2022   10:03  AM 05/08/2022    8:45 AM 04/02/2022    9:15 AM  PHQ9 SCORE ONLY  PHQ-9 Total Score 0 2 5     Physical Exam Comprehensive physical exam not completed today as office visit was conducted remotely.  Patient sounded well on audio call using HIPAA compliant application through [place].  Patient was alert and oriented, and appeared to have appropriate judgment.   No results found for any visits on 07/03/22.    The 10-year ASCVD risk score (Arnett DK, et al., 2019) is: 4.8%    Assessment & Plan:   Problem List Items Addressed This Visit       Other   Moderate episode of recurrent major depressive disorder (HCC)    Chronic, much improved on Lexapro 5 mg/day and Wellbutrin 150 mg/day.  No symptoms of serotonin syndrome.  No suicidal ideation, PHQ-9 improved.-Continue on current drug regimen and follow-up if anxiety or depression worsen.  Patient reports understanding.      Class 1 obesity without serious comorbidity with body mass index (BMI) of 30.0 to 30.9 in adult - Primary    Chronic, patient has lost 11 pounds since starting phentermine.  Current BMI 28.87, initial BMI 30.7.  Pressure decision-making will increase phentermine to 30 mg by mouth daily, she will continue on this for about 4 more weeks.  At which point she will take a drug holiday and follow-up 4 weeks later  to determine if we need to restart medication.  Patient reports understanding, patient also educated to call if she experiences any chest pain, shortness of breath, fatigue.      Relevant Medications   phentermine 30 MG capsule    Return in about 2 months (around 09/02/2022). Total time spent on audio application approximately 5 minutes.   Ailene Ards, NP

## 2022-07-03 NOTE — Assessment & Plan Note (Signed)
Chronic, patient has lost 11 pounds since starting phentermine.  Current BMI 28.87, initial BMI 30.7.  Pressure decision-making will increase phentermine to 30 mg by mouth daily, she will continue on this for about 4 more weeks.  At which point she will take a drug holiday and follow-up 4 weeks later to determine if we need to restart medication.  Patient reports understanding, patient also educated to call if she experiences any chest pain, shortness of breath, fatigue.

## 2022-07-03 NOTE — Assessment & Plan Note (Signed)
Chronic, much improved on Lexapro 5 mg/day and Wellbutrin 150 mg/day.  No symptoms of serotonin syndrome.  No suicidal ideation, PHQ-9 improved.-Continue on current drug regimen and follow-up if anxiety or depression worsen.  Patient reports understanding.

## 2022-07-09 ENCOUNTER — Ambulatory Visit (HOSPITAL_BASED_OUTPATIENT_CLINIC_OR_DEPARTMENT_OTHER): Payer: Medicaid Other | Admitting: Obstetrics & Gynecology

## 2022-07-29 DIAGNOSIS — Z419 Encounter for procedure for purposes other than remedying health state, unspecified: Secondary | ICD-10-CM | POA: Diagnosis not present

## 2022-08-06 ENCOUNTER — Telehealth: Payer: Self-pay | Admitting: *Deleted

## 2022-08-06 NOTE — Telephone Encounter (Signed)
Auth submitted to Fredderick Severance MCD for CPT 03979 2 units  PENDING: Holland [id number] [facility] - [id number]

## 2022-08-10 DIAGNOSIS — N939 Abnormal uterine and vaginal bleeding, unspecified: Secondary | ICD-10-CM | POA: Diagnosis not present

## 2022-08-17 ENCOUNTER — Other Ambulatory Visit: Payer: Self-pay | Admitting: Nurse Practitioner

## 2022-08-17 DIAGNOSIS — F331 Major depressive disorder, recurrent, moderate: Secondary | ICD-10-CM

## 2022-08-28 DIAGNOSIS — Z419 Encounter for procedure for purposes other than remedying health state, unspecified: Secondary | ICD-10-CM | POA: Diagnosis not present

## 2022-09-03 ENCOUNTER — Encounter: Payer: Self-pay | Admitting: Plastic Surgery

## 2022-09-03 ENCOUNTER — Encounter: Payer: Self-pay | Admitting: Nurse Practitioner

## 2022-09-04 ENCOUNTER — Telehealth (INDEPENDENT_AMBULATORY_CARE_PROVIDER_SITE_OTHER): Payer: 59 | Admitting: Nurse Practitioner

## 2022-09-04 VITALS — BP 130/92 | HR 82 | Ht 64.0 in | Wt 169.0 lb

## 2022-09-04 DIAGNOSIS — R030 Elevated blood-pressure reading, without diagnosis of hypertension: Secondary | ICD-10-CM | POA: Diagnosis not present

## 2022-09-04 DIAGNOSIS — Z6830 Body mass index (BMI) 30.0-30.9, adult: Secondary | ICD-10-CM | POA: Diagnosis not present

## 2022-09-04 DIAGNOSIS — E669 Obesity, unspecified: Secondary | ICD-10-CM

## 2022-09-04 DIAGNOSIS — F331 Major depressive disorder, recurrent, moderate: Secondary | ICD-10-CM | POA: Diagnosis not present

## 2022-09-04 MED ORDER — ESCITALOPRAM OXALATE 5 MG PO TABS: 5.0000 mg | ORAL_TABLET | Freq: Every day | ORAL | 3 refills | Status: DC

## 2022-09-04 NOTE — Assessment & Plan Note (Signed)
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  Recommend she check daily blood pressure at home over the next week or so, and then send me a MyChart message with at home readings

## 2022-09-04 NOTE — Assessment & Plan Note (Signed)
Chronic, initial BMI 30.7.  Current BMI 29.0.  Encouraged to continue focusing on diet and lifestyle management to help assist her to continue to lose weight, she was congratulated on being able to maintain weight loss that she has accomplished thus far.  Specifically she was told to use a calorie counting application on her phone so that she can make sure she is eating between 1100 to 1200 cal/day, she was also encouraged to consider getting a food scale to make sure she is accurately monitoring her portion sizes.  Due to blood pressure being slightly elevated recommend against prescription of phentermine at this time.  Patient will check blood pressure at home over the next week or so and send a list of at home blood pressures to me.  May consider refill at that point.

## 2022-09-04 NOTE — Assessment & Plan Note (Signed)
Chronic, appears well-controlled on current regimen.  Continue Wellbutrin 150 mg daily and Lexapro 5 mg daily, refill Lexapro sent to patient's pharmacy today.

## 2022-09-04 NOTE — Progress Notes (Signed)
Established Patient Office Visit   An audio-only tele-health visit was completed today for this patient. I connected with  Lindsey Sampson on 09/04/22 utilizing audio-only technology and verified that I am speaking with the correct person using two identifiers. The patient was located at their place of employment, and I was located at the office of [facility] at [facility] during the encounter. I discussed the limitations of evaluation and management by telemedicine. The patient expressed understanding and agreed to proceed.     Subjective   Patient ID: Lindsey Sampson, female    DOB: 1980/12/19  Age: 41 y.o. MRN: [id number]  Chief Complaint  Patient presents with   Obesity    Patient arrives for virtual visit for the above.  She has been off of phentermine for approximately 1 month, and would like to discuss possibly restarting it to assist her with weight loss.  She reports her at home weight is currently 23 which is about 1 pound up from last time it was checked about 2 months ago.  She reports that she has been trying to be consistent with her diet and avoiding sugary and fried foods.  Her goal weight is 135.  Her initial BMI when she started trying to lose weight was 3.7, today it is 29.01.    She also continues on Lexapro and Wellbutrin for management of her depression.  She reports tolerating medication well and feels that her mood is stable.    Review of Systems  Respiratory:  Negative for shortness of breath.   Cardiovascular:  Negative for chest pain.  Neurological:  Negative for seizures and loss of consciousness.  Psychiatric/Behavioral:  Negative for depression. The patient is not nervous/anxious.       Objective:     BP (!) 130/92   Pulse 82   Ht '5\' 4"'$  (1.626 m)   Wt 169 lb (76.7 kg)   BMI 29.01 kg/m  BP Readings from Last 3 Encounters:  09/04/22 (!) 130/92  06/05/22 130/81  05/08/22 122/78   Wt Readings from Last 3 Encounters:  09/04/22 169 lb  (76.7 kg)  07/03/22 168 lb 3 oz (76.3 kg)  06/05/22 174 lb 3.2 oz (79 kg)      Physical Exam Comprehensive physical exam not completed today as office visit was conducted remotely.  Patient sounded well over the phone..  Patient was alert and oriented, and appeared to have appropriate judgment.   No results found for any visits on 09/04/22.    The 10-year ASCVD risk score (Arnett DK, et al., 2019) is: 4.8%    Assessment & Plan:   Problem List Items Addressed This Visit       Other   Moderate episode of recurrent major depressive disorder (HCC)    Chronic, appears well-controlled on current regimen.  Continue Wellbutrin 150 mg daily and Lexapro 5 mg daily, refill Lexapro sent to patient's pharmacy today.      Relevant Medications   escitalopram (LEXAPRO) 5 MG tablet   Class 1 obesity without serious comorbidity with body mass index (BMI) of 30.0 to 30.9 in adult - Primary    Chronic, initial BMI 30.7.  Current BMI 29.0.  Encouraged to continue focusing on diet and lifestyle management to help assist her to continue to lose weight, she was congratulated on being able to maintain weight loss that she has accomplished thus far.  Specifically she was told to use a calorie counting application on her phone so that she  can make sure she is eating between 1100 to 1200 cal/day, she was also encouraged to consider getting a food scale to make sure she is accurately monitoring her portion sizes.  Due to blood pressure being slightly elevated recommend against prescription of phentermine at this time.  Patient will check blood pressure at home over the next week or so and send a list of at home blood pressures to me.  May consider refill at that point.      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  Recommend she  check daily blood pressure at home over the next week or so, and then send me a MyChart message with at home readings       Return in about 2 months (around 11/05/2022) for f/u with Judson Roch.  Total time spent with telephone was 11 minutes and 55 seconds.  Ailene Ards, NP

## 2022-09-08 ENCOUNTER — Telehealth: Payer: Self-pay | Admitting: *Deleted

## 2022-09-08 NOTE — Telephone Encounter (Signed)
Contacted [employer] about the denial. They state 870g must be removed from each breast in order to meet medical necessity criteria.

## 2022-09-09 ENCOUNTER — Encounter: Payer: Self-pay | Admitting: Nurse Practitioner

## 2022-09-09 NOTE — Telephone Encounter (Signed)
Pt f/u on BP readings and is wondering if it is ok to move forward with weight loss rx?

## 2022-09-10 ENCOUNTER — Other Ambulatory Visit: Payer: Self-pay | Admitting: Nurse Practitioner

## 2022-09-10 DIAGNOSIS — E669 Obesity, unspecified: Secondary | ICD-10-CM

## 2022-09-10 MED ORDER — PHENTERMINE HCL 15 MG PO CAPS: ORAL_CAPSULE | ORAL | 0 refills | Status: DC

## 2022-09-10 MED ORDER — PHENTERMINE HCL 30 MG PO CAPS: 30.0000 mg | ORAL_CAPSULE | ORAL | 0 refills | Status: DC

## 2022-09-14 NOTE — Telephone Encounter (Signed)
Jonell from [facility] in [place] about Phentermine and said that they needed an underlying condition to fill medication because BMI was under 30. Callback is [phone]. She said they faxed over something Friday about this as well

## 2022-09-18 ENCOUNTER — Other Ambulatory Visit: Payer: Self-pay

## 2022-09-18 ENCOUNTER — Other Ambulatory Visit: Payer: Self-pay | Admitting: Nurse Practitioner

## 2022-09-18 DIAGNOSIS — E669 Obesity, unspecified: Secondary | ICD-10-CM

## 2022-09-18 DIAGNOSIS — F331 Major depressive disorder, recurrent, moderate: Secondary | ICD-10-CM

## 2022-09-18 MED ORDER — PHENTERMINE HCL 15 MG PO CAPS: ORAL_CAPSULE | ORAL | 0 refills | Status: DC

## 2022-09-18 MED ORDER — PHENTERMINE HCL 30 MG PO CAPS: 30.0000 mg | ORAL_CAPSULE | ORAL | 0 refills | Status: DC

## 2022-09-18 NOTE — Telephone Encounter (Signed)
Please refill per pt request

## 2022-09-18 NOTE — Telephone Encounter (Signed)
Med is send to [facility] per pt request

## 2022-09-19 ENCOUNTER — Ambulatory Visit
Admission: EM | Admit: 2022-09-19 | Discharge: 2022-09-19 | Disposition: A | Discharge status: Home / Self Care | Payer: Medicaid Other | Attending: Nurse Practitioner | Admitting: Nurse Practitioner

## 2022-09-19 DIAGNOSIS — R6889 Other general symptoms and signs: Secondary | ICD-10-CM | POA: Diagnosis not present

## 2022-09-19 MED ORDER — PSEUDOEPH-BROMPHEN-DM 30-2-10 MG/5ML PO SYRP: 5.0000 mL | ORAL_SOLUTION | Freq: Four times a day (QID) | ORAL | 0 refills | Status: DC | PRN

## 2022-09-19 MED ORDER — CETIRIZINE HCL 10 MG PO TABS: 10.0000 mg | ORAL_TABLET | Freq: Every day | ORAL | 0 refills | Status: DC

## 2022-09-19 NOTE — ED Provider Notes (Signed)
[facility]    CSN: [id number] Arrival date & time: 09/19/22  0820      History   Chief Complaint Chief Complaint  Patient presents with   Cough    HPI Lindsey Sampson is a 41 y.o. female.   The history is provided by the patient.   Patient presents with a 3-day history of bodyaches, cough, bilateral ear pain, and nasal congestion.  Patient denies fever, chills, sore throat, headache, abdominal pain, nausea, vomiting, or diarrhea.  Patient states she has been taking over-the-counter cough and cold medications with have given her some relief.  She also states that she took a home COVID test which was negative.  Past Medical History:  Diagnosis Date   Anxiety    Benign paroxysmal positional vertigo 05/08/2022   Colitis    Cyst of spleen    Depression    Ovarian cyst    Renal disorder    renal cyst,     Patient Active Problem List   Diagnosis Date Noted   Elevated blood pressure reading 09/04/2022   Class 1 obesity without serious comorbidity with body mass index (BMI) of 30.0 to 30.9 in adult 04/02/2022   Symptomatic mammary hypertrophy 03/13/2022   Neck pain 03/13/2022   Abdominal pain 03/02/2022   Anxiety 03/02/2022   Dysmenorrhea 03/02/2022   GERD (gastroesophageal reflux disease) 03/02/2022   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/02/2022   Menorrhagia with regular cycle 03/02/2022   Bilateral serous otitis media 03/02/2022   Fatigue 03/02/2022   Screening for cardiovascular condition 03/02/2022   Screening for diabetes mellitus 03/02/2022   Moderate episode of recurrent major depressive disorder ([place]) 03/02/2022   Sacroiliac joint pain 01/09/2022   Disorder of right sacroiliac joint 01/05/2022   Back pain 12/04/2021   Open displaced fracture of proximal phalanx of right great toe 02/20/2020   Great toe pain, right 09/07/2019   Open displaced fracture of proximal phalanx of great toe 09/06/2019   Peptic ulcer disease 07/26/2019   Early  satiety 07/26/2019   Liver lesion 07/26/2019   H/O tubal ligation 10/14/2018   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    Past Surgical History:  Procedure Laterality Date   BACK SURGERY     BIOPSY  11/10/2019   Procedure: BIOPSY;  Surgeon: Danie Binder, MD;  [facility];  Service: Endoscopy;;   CESAREAN SECTION     CESAREAN SECTION  2006 and 2010   cyst removed from ovary Right    ESOPHAGOGASTRODUODENOSCOPY N/A 11/10/2019   Procedure: ESOPHAGOGASTRODUODENOSCOPY (EGD);  Surgeon: Danie Binder, MD;  [facility];  Service: Endoscopy;  Laterality: N/A;  12:00pm   LUMBAR LAMINECTOMY/DECOMPRESSION MICRODISCECTOMY Left 05/14/2016   Procedure: LUMBAR DECOMPRESSION/MICRODISCECTOMY LEFT LUMBAR FOUR-FIVE;  Surgeon: Melina Schools, MD;  Location: [place];  Service: Orthopedics;  Laterality: Left;   ORIF TOE FRACTURE Right 09/07/2019   Procedure: IRRIGATION AND DEBRIDEMENT AND OPEN REDUCTION INTERNAL FIXATION (ORIF) Proximal phalanx FRACTURE;  Surgeon: Shona Needles, MD;  Location: [place];  Service: Orthopedics;  Laterality: Right;   ORIF TOE FRACTURE Right 04/03/2020   Procedure: REPAIR OF RIGHT GREAT TOE NONUNION  WITH BONE GRAFTING RIGHT HIP;  Surgeon: Shona Needles, MD;  Location: [place];  Service: Orthopedics;  Laterality: Right;   TUBAL LIGATION      OB History     Gravida  2   Para      Term      Preterm      AB  Living  2      SAB      IAB      Ectopic      Multiple  0   Live Births  2            Home Medications    Prior to Admission medications   Medication Sig Start Date End Date Taking? Authorizing Provider  brompheniramine-pseudoephedrine-DM 30-2-10 MG/5ML syrup Take 5 mLs by mouth 4 (four) times daily as needed. 09/19/22  Yes Paddy Neis-Warren, Alda Lea, NP  cetirizine (ZYRTEC) 10 MG tablet Take 1 tablet (10 mg total) by mouth daily. 09/19/22  Yes Miracle Criado-Warren, Alda Lea, NP  acetaminophen (TYLENOL) 500 MG tablet Take 500 mg by mouth  every 6 (six) hours as needed for mild pain.    [provider]  buPROPion (WELLBUTRIN XL) 150 MG 24 hr tablet Take 1 tablet (150 mg total) by mouth daily. 06/05/22   Ailene Ards, NP  celecoxib (CELEBREX) 200 MG capsule celecoxib 200 mg capsule  TAKE 1 CAPSULE BY MOUTH TWICE DAILY AS NEEDED FOR 15 DAYS    [provider]  escitalopram (LEXAPRO) 5 MG tablet Take 1 tablet (5 mg total) by mouth daily. 09/04/22   Ailene Ards, NP  fluticasone Asencion Islam) 50 MCG/ACT nasal spray Place 2 sprays into both nostrils daily. 03/02/22   Ailene Ards, NP  ondansetron (ZOFRAN) 8 MG tablet Take 1 tablet (8 mg total) by mouth 3 (three) times daily as needed. 05/08/22   Ailene Ards, NP  phentermine 15 MG capsule Take '15mg'$  (1 capsule) by mouth once a day for 7 days. Then increase dose to '30mg'$  by mouth daily. 09/18/22   Ailene Ards, NP  phentermine 30 MG capsule Take 1 capsule (30 mg total) by mouth every morning. 09/23/22   Ailene Ards, NP  clonazePAM (KLONOPIN) 0.5 MG tablet Take 0.5 mg by mouth 3 (three) times daily as needed. For anxiety/sleep. 03/12/16 03/29/19  [provider]    Family History Family History  Problem Relation Age of Onset   Diabetes Mother    Hypertension Mother    High Cholesterol Mother    Hypertension Father    Prostate cancer Father    High Cholesterol Father    Colon polyps Maternal Grandmother    Colon cancer Neg Hx     Social History Social History   Tobacco Use   Smoking status: Light Smoker    Packs/day: 0.50    Types: Cigarettes   Smokeless tobacco: Never   Tobacco comments:    trying to quit   Vaping Use   Vaping Use: Former  Substance Use Topics   Alcohol use: Yes    Comment: rare   Drug use: No     Allergies   Patient has no known allergies.   Review of Systems Review of Systems Per HPI  Physical Exam Triage Vital Signs ED Triage Vitals  Enc Vitals Group     BP 09/19/22 0933 122/80     Pulse Rate 09/19/22 0933 72      Resp 09/19/22 0933 18     Temp 09/19/22 0933 97.7 F (36.5 C)     Temp Source 09/19/22 0933 Oral     SpO2 09/19/22 0933 98 %     Weight --      Height --      Head Circumference --      Peak Flow --      Pain Score 09/19/22 0935 5  Pain Loc --      Pain Edu? --      Excl. in [place]? --    No data found.  Updated Vital Signs BP 122/80 (BP Location: Right Arm)   Pulse 72   Temp 97.7 F (36.5 C) (Oral)   Resp 18   LMP  (Within Weeks) Comment: 3 weeks  SpO2 98%   Visual Acuity Right Eye Distance:   Left Eye Distance:   Bilateral Distance:    Right Eye Near:   Left Eye Near:    Bilateral Near:     Physical Exam Vitals and nursing note reviewed.  Constitutional:      General: She is not in acute distress.    Appearance: Normal appearance.  HENT:     Head: Normocephalic.     Right Ear: Tympanic membrane, ear canal and external ear normal.     Left Ear: Tympanic membrane, ear canal and external ear normal.     Nose: Congestion and rhinorrhea present.     Right Turbinates: Enlarged and swollen.     Left Turbinates: Enlarged and swollen.     Right Sinus: No maxillary sinus tenderness or frontal sinus tenderness.     Left Sinus: No maxillary sinus tenderness or frontal sinus tenderness.     Mouth/Throat:     Lips: Pink.     Mouth: Mucous membranes are moist.     Pharynx: Uvula midline. Posterior oropharyngeal erythema present. No pharyngeal swelling.  Eyes:     Extraocular Movements: Extraocular movements intact.     Conjunctiva/sclera: Conjunctivae normal.     Pupils: Pupils are equal, round, and reactive to light.  Cardiovascular:     Rate and Rhythm: Normal rate and regular rhythm.     Pulses: Normal pulses.     Heart sounds: Normal heart sounds.  Pulmonary:     Effort: Pulmonary effort is normal.     Breath sounds: Normal breath sounds.  Abdominal:     General: Bowel sounds are normal.     Palpations: Abdomen is soft.     Tenderness: There is no abdominal  tenderness.  Musculoskeletal:     Cervical back: Normal range of motion.  Lymphadenopathy:     Cervical: No cervical adenopathy.  Skin:    General: Skin is warm and dry.  Neurological:     General: No focal deficit present.     Mental Status: She is alert and oriented to person, place, and time.  Psychiatric:        Mood and Affect: Mood normal.        Behavior: Behavior normal.      UC Treatments / Results  Labs (all labs ordered are listed, but only abnormal results are displayed) Labs Reviewed - No data to display  EKG   Radiology No results found.  Procedures Procedures (including critical care time)  Medications Ordered in UC Medications - No data to display  Initial Impression / Assessment and Plan / UC Course  I have reviewed the triage vital signs and the nursing notes.  Pertinent labs & imaging results that were available during my care of the patient were reviewed by me and considered in my medical decision making (see chart for details).  The patient is well-appearing, she is in no acute distress, vital signs are stable.  Suspect a viral upper respiratory infection with cough, most likely influenza.  Patient is out of the window to begin Tamiflu, but suspect viral infection due to patient's complaints of  bodyaches, patient was provided symptomatic treatment with Bromfed-DM cough and cetirizine 10 mg for her nasal congestion.  Urgent care recommendations were provided to the patient to include continuing ibuprofen or Tylenol for pain, fever, or general discomfort, use of a humidifier at bedtime, and sleeping elevated on pillows while cough symptoms persist.  Discussed viral etiology with the patient.  Patient verbalizes understanding.  All questions were answered.  Patient is stable for discharge.   Final Clinical Impressions(s) / UC Diagnoses   Final diagnoses:  Flu-like symptoms     Discharge Instructions      Patient declined AVS.     ED  Prescriptions     Medication Sig Dispense Auth. Provider   brompheniramine-pseudoephedrine-DM 30-2-10 MG/5ML syrup Take 5 mLs by mouth 4 (four) times daily as needed. 140 mL [place] Ducre-Warren, Alda Lea, NP   cetirizine (ZYRTEC) 10 MG tablet Take 1 tablet (10 mg total) by mouth daily. 30 tablet [place]-Warren, Alda Lea, NP      PDMP not reviewed this encounter.   Tish Men, NP 09/19/22 1003

## 2022-09-19 NOTE — Discharge Instructions (Signed)
Patient declined AVS 

## 2022-09-19 NOTE — ED Triage Notes (Signed)
Pt reports cough, bilateral ear pain, and nasal congestion x 3 days. OTC cold meds give some relief.   Negative COVID test.

## 2022-09-28 DIAGNOSIS — Z419 Encounter for procedure for purposes other than remedying health state, unspecified: Secondary | ICD-10-CM | POA: Diagnosis not present

## 2022-09-29 ENCOUNTER — Other Ambulatory Visit: Payer: Self-pay | Admitting: Nurse Practitioner

## 2022-09-29 DIAGNOSIS — E669 Obesity, unspecified: Secondary | ICD-10-CM

## 2022-09-29 NOTE — Telephone Encounter (Signed)
Rec'd note stating... Hey the pharmacy has the '30mg'$  on back ordet and nit shre whrn they can get in they told me if i got yall to rewrite a prescription for the '15mg'$  and just take 2 of those a day than they could fill the prescription like that

## 2022-10-01 MED ORDER — PHENTERMINE HCL 15 MG PO CAPS: 30.0000 mg | ORAL_CAPSULE | Freq: Every day | ORAL | 0 refills | Status: DC

## 2022-10-28 ENCOUNTER — Telehealth: Payer: Self-pay | Admitting: *Deleted

## 2022-10-28 NOTE — Telephone Encounter (Signed)
Lorie Apley request submitted to [facility]  [id number] approved 11/23/22 - 01/22/23

## 2022-10-29 ENCOUNTER — Telehealth: Payer: Self-pay | Admitting: Nurse Practitioner

## 2022-10-29 ENCOUNTER — Ambulatory Visit (INDEPENDENT_AMBULATORY_CARE_PROVIDER_SITE_OTHER): Payer: Medicaid Other | Admitting: Nurse Practitioner

## 2022-10-29 DIAGNOSIS — H8110 Benign paroxysmal vertigo, unspecified ear: Secondary | ICD-10-CM | POA: Diagnosis not present

## 2022-10-29 DIAGNOSIS — Z6830 Body mass index (BMI) 30.0-30.9, adult: Secondary | ICD-10-CM

## 2022-10-29 DIAGNOSIS — F331 Major depressive disorder, recurrent, moderate: Secondary | ICD-10-CM | POA: Diagnosis not present

## 2022-10-29 DIAGNOSIS — E669 Obesity, unspecified: Secondary | ICD-10-CM

## 2022-10-29 DIAGNOSIS — Z419 Encounter for procedure for purposes other than remedying health state, unspecified: Secondary | ICD-10-CM | POA: Diagnosis not present

## 2022-10-29 MED ORDER — PHENTERMINE HCL 15 MG PO CAPS: 30.0000 mg | ORAL_CAPSULE | Freq: Every day | ORAL | 0 refills | Status: DC

## 2022-10-29 MED ORDER — ONDANSETRON HCL 8 MG PO TABS: 8.0000 mg | ORAL_TABLET | Freq: Three times a day (TID) | ORAL | 1 refills | Status: DC | PRN

## 2022-10-29 MED ORDER — ESCITALOPRAM OXALATE 5 MG PO TABS: 5.0000 mg | ORAL_TABLET | Freq: Every day | ORAL | 3 refills | Status: DC

## 2022-10-29 MED ORDER — BUPROPION HCL ER (XL) 150 MG PO TB24: 150.0000 mg | ORAL_TABLET | Freq: Every day | ORAL | 1 refills | Status: DC

## 2022-10-29 NOTE — Assessment & Plan Note (Signed)
Chronic, patient to continue taking phentermine 30 mg/day.  She will continue this for approximately number weeks.  Was educated to continue focusing on lifestyle modification as well.  Did discuss that if she does not achieve 5% weight loss, we will need to discontinue medication.  Patient reports understanding.

## 2022-10-29 NOTE — Assessment & Plan Note (Signed)
Zofran 8 mg every 3 hours as needed sent to patient's pharmacy.

## 2022-10-29 NOTE — Telephone Encounter (Signed)
Pharmacy called for clarification on the pt's phentermine 15 MG capsule RX.   Please call [facility] to advise: Phone: [phone]  Fax: [phone]

## 2022-10-29 NOTE — Progress Notes (Signed)
Established Patient Office Visit  Subjective   Patient ID: Lindsey Sampson, female    DOB: March 05, 1981  Age: 42 y.o. MRN: [id number]  Chief Complaint  Patient presents with   Obesity    Patient has today for follow-up of obesity.  She is currently on phentermine reports having taken it for the last 2 to 3 weeks and is tolerating it well.  No chest pain, no shortness of breath, no headache, no new blurry vision.  Also requesting refill of her Lexapro, Wellbutrin, and Zofran.  Reports mood has been stable.  Does have intermittent vertigo which she takes her Zofran for.    Review of Systems  Eyes:  Negative for blurred vision.  Respiratory:  Negative for shortness of breath.   Cardiovascular:  Negative for chest pain.  Neurological:  Negative for headaches.      Objective:     BP 106/78   Pulse 85   Temp 97.8 F (36.6 C) (Temporal)   Ht '5\' 4"'$  (1.626 m)   Wt 169 lb 2 oz (76.7 kg)   SpO2 100%   BMI 29.03 kg/m  BP Readings from Last 3 Encounters:  10/29/22 106/78  09/19/22 122/80  09/04/22 (!) 130/92   Wt Readings from Last 3 Encounters:  10/29/22 169 lb 2 oz (76.7 kg)  09/04/22 169 lb (76.7 kg)  07/03/22 168 lb 3 oz (76.3 kg)      Physical Exam Vitals reviewed.  Constitutional:      General: She is not in acute distress.    Appearance: Normal appearance.  HENT:     Head: Normocephalic and atraumatic.  Neck:     Vascular: No carotid bruit.  Cardiovascular:     Rate and Rhythm: Normal rate and regular rhythm.     Pulses: Normal pulses.     Heart sounds: Normal heart sounds.  Pulmonary:     Effort: Pulmonary effort is normal.     Breath sounds: Normal breath sounds.  Skin:    General: Skin is warm and dry.  Neurological:     General: No focal deficit present.     Mental Status: She is alert and oriented to person, place, and time.  Psychiatric:        Mood and Affect: Mood normal.        Behavior: Behavior normal.        Judgment: Judgment normal.       No results found for any visits on 10/29/22.    The 10-year ASCVD risk score (Arnett DK, et al., 2019) is: 3.3%    Assessment & Plan:   Problem List Items Addressed This Visit       Nervous and Auditory   Benign paroxysmal positional vertigo    Zofran 8 mg every 3 hours as needed sent to patient's pharmacy.      Relevant Medications   ondansetron (ZOFRAN) 8 MG tablet     Other   Moderate episode of recurrent major depressive disorder (HCC)    Chronic, mood stable.  Refill on Wellbutrin and Lexapro sent to patient's pharmacy today.      Relevant Medications   buPROPion (WELLBUTRIN XL) 150 MG 24 hr tablet   escitalopram (LEXAPRO) 5 MG tablet   Class 1 obesity without serious comorbidity with body mass index (BMI) of 30.0 to 30.9 in adult    Chronic, patient to continue taking phentermine 30 mg/day.  She will continue this for approximately number weeks.  Was educated to continue focusing on  lifestyle modification as well.  Did discuss that if she does not achieve 5% weight loss, we will need to discontinue medication.  Patient reports understanding.      Relevant Medications   phentermine 15 MG capsule    Return in about 9 weeks (around 12/31/2022) for Follow-up with Jenilyn Magana.    Ailene Ards, NP

## 2022-10-29 NOTE — Assessment & Plan Note (Signed)
Chronic, mood stable.  Refill on Wellbutrin and Lexapro sent to patient's pharmacy today.

## 2022-11-02 ENCOUNTER — Other Ambulatory Visit: Payer: Self-pay

## 2022-11-02 DIAGNOSIS — E669 Obesity, unspecified: Secondary | ICD-10-CM

## 2022-11-02 MED ORDER — PHENTERMINE HCL 15 MG PO CAPS: 30.0000 mg | ORAL_CAPSULE | Freq: Every day | ORAL | 0 refills | Status: DC

## 2022-11-02 NOTE — Telephone Encounter (Signed)
Left detail message for pt in regards her current pharmacy [facility], who will not fill prescription due to their policy if pt BMI dose not meet their requirement

## 2022-11-02 NOTE — Telephone Encounter (Signed)
Prescription sent to [facility] in [facility]

## 2022-11-02 NOTE — Addendum Note (Signed)
Addended by: Binnie Rail on: 11/02/2022 04:27 PM   Modules accepted: Orders

## 2022-11-02 NOTE — Telephone Encounter (Signed)
You can ask the patient if she wants it sent to a different pharmacy.  A different pharmacy may fill this and I can send it elsewhere.

## 2022-11-02 NOTE — Telephone Encounter (Signed)
Spoke to pharmacist, she stated since pt BMI at the moment is Below 30 and had no underlying health condition like HT, [facility] pharmacy dose not RX medication, it will have to be send to a different pharmacy

## 2022-11-02 NOTE — Telephone Encounter (Signed)
PT calls today following up on this note. PT stated that [id number] [place] [place].  CB: [phone]

## 2022-11-09 ENCOUNTER — Ambulatory Visit (INDEPENDENT_AMBULATORY_CARE_PROVIDER_SITE_OTHER): Payer: Medicaid Other | Admitting: Student

## 2022-11-09 VITALS — BP 119/78 | HR 77 | Temp 98.1°F | Resp 16 | Ht 64.0 in | Wt 169.0 lb

## 2022-11-09 DIAGNOSIS — N62 Hypertrophy of breast: Secondary | ICD-10-CM

## 2022-11-09 MED ORDER — ONDANSETRON HCL 4 MG PO TABS: 4.0000 mg | ORAL_TABLET | Freq: Three times a day (TID) | ORAL | 0 refills | Status: DC | PRN

## 2022-11-09 MED ORDER — CEPHALEXIN 500 MG PO CAPS: 500.0000 mg | ORAL_CAPSULE | Freq: Four times a day (QID) | ORAL | 0 refills | Status: AC

## 2022-11-09 MED ORDER — OXYCODONE HCL 5 MG PO TABS: 5.0000 mg | ORAL_TABLET | Freq: Three times a day (TID) | ORAL | 0 refills | Status: DC | PRN

## 2022-11-09 NOTE — H&P (View-Only) (Signed)
Patient ID: Lindsey Sampson, female    DOB: Feb 16, 1981, 42 y.o.   MRN: [id number]  Chief Complaint  Patient presents with   Pre-op Exam      ICD-10-CM   1. Symptomatic mammary hypertrophy  N62        History of Present Illness: Lindsey Sampson is a 42 y.o.  female  with a history of macromastia.  She presents for preoperative evaluation for upcoming procedure, Bilateral Breast Reduction with liposuction, scheduled for 12/03/2022 with Dr.  Marla Roe  The patient has not had problems with anesthesia.  Patient reports she had a mammogram last March which was negative.  Patient denies any personal family history of breast cancer.  She denies any history of cardiac disease.  Patient reports that she quit smoking 6 months ago and has not smoked since.  Patient denies taking any birth control or hormone replacement.  She denies any history of miscarriages.  She denies any personal or family history of blood clots or clotting diseases.  She denies any recent traumas, infections or surgeries.  She denies any history of heart attack or stroke.  She denies any history of Crohn's disease or ulcerative colitis.  She denies any history of COPD or asthma.  She denies any history of cancer.  She reports that she has had a varicose vein removed in the past but does not have any currently.  Patient denies any recent fevers, chills or changes in her health.  Patient reports she is currently a DD/DDD cup.  She states that she would like to be a C cup.  I discussed with the patient the limitations of surgery and how much tissue can be removed at the time of surgery.  I discussed with the patient that cup size cannot be guaranteed.  Patient expressed understanding.  Summary of Previous Visit: Patient was initially seen by Dr. Marla Roe on 03/13/2022.  At this visit, she reported neck and back pain.  She reported that she noticed relief by holding up her breast manually.  She also reported that her shoulder straps  were causing grooving.  Patient was found to have an STN of 33 cm on the right and 34 cm on the left.  Her BMI was 31.4 kg/m.  Her preoperative bra size was a DD/DDD cup.  Patient reported at this visit that she would like to be a C cup.  Patient at this visit also reported she was a smoker.  She stated that she is working on quitting.  Patient was found to be a good candidate for bilateral breast reduction with liposuction.  Patient most recently had follow-up with our office on 06/25/2022.  At this visit, she reported she was doing well.  She stated that she had completed physical therapy but was still having back pain.  Patient also reported at this visit that she had stopped smoking for about 2 months prior to the visit and had not smoked since.  Patient at this visit reported she was working on losing weight.  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  I discussed with the patient that if she needs any paperwork filled out or note to let us know.  PMH Significant for: Symptomatic macromastia, peptic ulcer disease, GERD, history of tobacco use  Patient states she is no longer taking Celebrex or Klonopin.  Past Medical History: Allergies: No Known Allergies  Current Medications:  Current Outpatient Medications:    acetaminophen (TYLENOL) 500 MG tablet, Take 500 mg by mouth every 6 (six) hours as needed for mild pain., Disp: , Rfl:    buPROPion (WELLBUTRIN XL) 150 MG 24 hr tablet, Take 1 tablet (150 mg total) by mouth daily., Disp: 90 tablet, Rfl: 1   ondansetron (ZOFRAN) 8 MG tablet, Take 1 tablet (8 mg total) by mouth 3 (three) times daily as needed., Disp: 20 tablet, Rfl: 1   phentermine 15 MG capsule, Take 2 capsules (30 mg total) by mouth daily. Take '15mg'$  (1 capsule) by mouth once a day for 7 days. Then increase dose to '30mg'$  by mouth daily.,  Disp: 60 capsule, Rfl: 0   celecoxib (CELEBREX) 200 MG capsule, celecoxib 200 mg capsule  TAKE 1 CAPSULE BY MOUTH TWICE DAILY AS NEEDED FOR 15 DAYS, Disp: , Rfl:    cetirizine (ZYRTEC) 10 MG tablet, Take 1 tablet (10 mg total) by mouth daily., Disp: 30 tablet, Rfl: 0   escitalopram (LEXAPRO) 5 MG tablet, Take 1 tablet (5 mg total) by mouth daily. (Patient not taking: Reported on 11/09/2022), Disp: 90 tablet, Rfl: 3   fluticasone (FLONASE) 50 MCG/ACT nasal spray, Place 2 sprays into both nostrils daily., Disp: 16 g, Rfl: 6  Past Medical Problems: Past Medical History:  Diagnosis Date   Anxiety    Benign paroxysmal positional vertigo 05/08/2022   Colitis    Cyst of spleen    Depression    Ovarian cyst    Renal disorder    renal cyst,     Past Surgical History: Past Surgical History:  Procedure Laterality Date   BACK SURGERY     BIOPSY  11/10/2019   Procedure: BIOPSY;  Surgeon: Danie Binder, MD;  [facility];  Service: Endoscopy;;   CESAREAN SECTION     CESAREAN SECTION  2006 and 2010   cyst removed from ovary Right    ESOPHAGOGASTRODUODENOSCOPY N/A 11/10/2019   Procedure: ESOPHAGOGASTRODUODENOSCOPY (EGD);  Surgeon: Danie Binder, MD;  [facility];  Service: Endoscopy;  Laterality: N/A;  12:00pm   LUMBAR LAMINECTOMY/DECOMPRESSION MICRODISCECTOMY Left 05/14/2016   Procedure: LUMBAR DECOMPRESSION/MICRODISCECTOMY LEFT LUMBAR FOUR-FIVE;  Surgeon: Melina Schools, MD;  Location: [place];  Service: Orthopedics;  Laterality: Left;   ORIF TOE FRACTURE Right 09/07/2019   Procedure: IRRIGATION AND DEBRIDEMENT AND OPEN REDUCTION INTERNAL FIXATION (ORIF) Proximal phalanx FRACTURE;  Surgeon: Shona Needles, MD;  Location: [place];  Service: Orthopedics;  Laterality: Right;   ORIF TOE FRACTURE Right 04/03/2020   Procedure: REPAIR OF RIGHT GREAT TOE NONUNION  WITH BONE GRAFTING RIGHT HIP;  Surgeon: Shona Needles, MD;  Location: [place];  Service: Orthopedics;  Laterality: Right;    TUBAL LIGATION      Social History: Social History   Socioeconomic History   Marital status: Single    Spouse name: Not on file   Number of children: Not on file   Years of education: Not on file   Highest education level: Not on file  Occupational History   Not on file  Tobacco Use   Smoking status: Light Smoker    Packs/day: 0.50    Types: Cigarettes   Smokeless tobacco: Never   Tobacco comments:    trying to quit   Vaping Use   Vaping Use: Former  Substance and Sexual Activity   Alcohol use: Yes    Comment: rare   Drug  use: No   Sexual activity: Yes    Partners: Male    Birth control/protection: Surgical  Other Topics Concern   Not on file  Social History Narrative   Not on file   Social Determinants of Health   Financial Resource Strain: Not on file  Food Insecurity: Not on file  Transportation Needs: Not on file  Physical Activity: Not on file  Stress: Not on file  Social Connections: Not on file  Intimate Partner Violence: Not on file    Family History: Family History  Problem Relation Age of Onset   Diabetes Mother    Hypertension Mother    High Cholesterol Mother    Hypertension Father    Prostate cancer Father    High Cholesterol Father    Colon polyps Maternal Grandmother    Colon cancer Neg Hx     Review of Systems: Denies any recent fevers, chills or changes in her health.  Physical Exam: Vital Signs BP 119/78 (BP Location: Left Arm, Patient Position: Sitting, Cuff Size: Normal)   Pulse 77   Temp 98.1 F (36.7 C) (Oral)   Resp 16   Ht '5\' 4"'$  (1.626 m)   Wt 169 lb (76.7 kg)   SpO2 99%   BMI 29.01 kg/m   Physical Exam  Constitutional:      General: Not in acute distress.    Appearance: Normal appearance. Not ill-appearing.  HENT:     Head: Normocephalic and atraumatic.  Neck:     Musculoskeletal: Normal range of motion.  Cardiovascular:     Rate and Rhythm: Normal rate Pulmonary:     Effort: Pulmonary effort is normal. No  respiratory distress.  Musculoskeletal: Normal range of motion.  Lower extremities: No varicose veins noted on exam  Skin:    General: Skin is warm and dry.     Findings: No erythema or rash.  Neurological:     Mental Status: Alert and oriented to person, place, and time. Mental status is at baseline.  Psychiatric:        Mood and Affect: Mood normal.        Behavior: Behavior normal.    Assessment/Plan: The patient is scheduled for bilateral breast reduction with Dr. Marla Roe.  Risks, benefits, and alternatives of procedure discussed, questions answered and consent obtained.    Smoking Status: Former smoker, quit 6 months ago; Counseling Given?  I discussed with the patient that she should continue to not smoke and that smoking can greatly inhibit her wound healing.  Patient expressed understanding. Last Mammogram: Per chart review, last mammogram was 12/17/2021.; Results: BI-RADS Category 1, negative  Caprini Score: 4; Risk Factors include: Age, BMI greater than 25, and length of planned surgery. Recommendation for mechanical prophylaxis. Encourage early ambulation.   Pictures obtained: '@consult'$   Post-op Rx sent to pharmacy: Oxycodone, Zofran, Keflex  I instructed the patient to hold her phentermine, vitamins and ibuprofen 1 week prior to surgery.  Patient expressed understanding.  Patient was provided with the breast reduction and General Surgical Risk consent document and Pain Medication Agreement prior to their appointment.  They had adequate time to read through the risk consent documents and Pain Medication Agreement. We also discussed them in person together during this preop appointment. All of their questions were answered to their satisfaction.  Recommended calling if they have any further questions.  Risk consent form and Pain Medication Agreement to be scanned into patient's chart.  The risk that can be encountered with breast reduction were  discussed and include the  following but not limited to these:  Breast asymmetry, fluid accumulation, firmness of the breast, inability to breast feed, loss of nipple or areola, skin loss, decrease or no nipple sensation, fat necrosis of the breast tissue, bleeding, infection, healing delay.  There are risks of anesthesia, changes to skin sensation and injury to nerves or blood vessels.  The muscle can be temporarily or permanently injured.  You may have an allergic reaction to tape, suture, glue, blood products which can result in skin discoloration, swelling, pain, skin lesions, poor healing.  Any of these can lead to the need for revisonal surgery or stage procedures.  A reduction has potential to interfere with diagnostic procedures.  Nipple or breast piercing can increase risks of infection.  This procedure is best done when the breast is fully developed.  Changes in the breast will continue to occur over time.  Pregnancy can alter the outcomes of previous breast reduction surgery, weight gain and weigh loss can also effect the long term appearance.     Electronically signed by: Clance Boll, PA-C 11/09/2022 11:58 AM

## 2022-11-09 NOTE — Progress Notes (Signed)
Patient ID: Lindsey Sampson, female    DOB: May 20, 1981, 42 y.o.   MRN: [id number]  Chief Complaint  Patient presents with   Pre-op Exam      ICD-10-CM   1. Symptomatic mammary hypertrophy  N62        History of Present Illness: Lindsey Sampson is a 42 y.o.  female  with a history of macromastia.  She presents for preoperative evaluation for upcoming procedure, Bilateral Breast Reduction with liposuction, scheduled for 12/03/2022 with Dr.  Marla Roe  The patient has not had problems with anesthesia.  Patient reports she had a mammogram last March which was negative.  Patient denies any personal family history of breast cancer.  She denies any history of cardiac disease.  Patient reports that she quit smoking 6 months ago and has not smoked since.  Patient denies taking any birth control or hormone replacement.  She denies any history of miscarriages.  She denies any personal or family history of blood clots or clotting diseases.  She denies any recent traumas, infections or surgeries.  She denies any history of heart attack or stroke.  She denies any history of Crohn's disease or ulcerative colitis.  She denies any history of COPD or asthma.  She denies any history of cancer.  She reports that she has had a varicose vein removed in the past but does not have any currently.  Patient denies any recent fevers, chills or changes in her health.  Patient reports she is currently a DD/DDD cup.  She states that she would like to be a C cup.  I discussed with the patient the limitations of surgery and how much tissue can be removed at the time of surgery.  I discussed with the patient that cup size cannot be guaranteed.  Patient expressed understanding.  Summary of Previous Visit: Patient was initially seen by Dr. Marla Roe on 03/13/2022.  At this visit, she reported neck and back pain.  She reported that she noticed relief by holding up her breast manually.  She also reported that her shoulder straps  were causing grooving.  Patient was found to have an STN of 33 cm on the right and 34 cm on the left.  Her BMI was 31.4 kg/m.  Her preoperative bra size was a DD/DDD cup.  Patient reported at this visit that she would like to be a C cup.  Patient at this visit also reported she was a smoker.  She stated that she is working on quitting.  Patient was found to be a good candidate for bilateral breast reduction with liposuction.  Patient most recently had follow-up with our office on 06/25/2022.  At this visit, she reported she was doing well.  She stated that she had completed physical therapy but was still having back pain.  Patient also reported at this visit that she had stopped smoking for about 2 months prior to the visit and had not smoked since.  Patient at this visit reported she was working on losing weight.  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  I discussed with the patient that if she needs any paperwork filled out or note to let us know.  PMH Significant for: Symptomatic macromastia, peptic ulcer disease, GERD, history of tobacco use  Patient states she is no longer taking Celebrex or Klonopin.  Past Medical History: Allergies: No Known Allergies  Current Medications:  Current Outpatient Medications:    acetaminophen (TYLENOL) 500 MG tablet, Take 500 mg by mouth every 6 (six) hours as needed for mild pain., Disp: , Rfl:    buPROPion (WELLBUTRIN XL) 150 MG 24 hr tablet, Take 1 tablet (150 mg total) by mouth daily., Disp: 90 tablet, Rfl: 1   ondansetron (ZOFRAN) 8 MG tablet, Take 1 tablet (8 mg total) by mouth 3 (three) times daily as needed., Disp: 20 tablet, Rfl: 1   phentermine 15 MG capsule, Take 2 capsules (30 mg total) by mouth daily. Take 75m (1 capsule) by mouth once a day for 7 days. Then increase dose to 342mby mouth daily.,  Disp: 60 capsule, Rfl: 0   celecoxib (CELEBREX) 200 MG capsule, celecoxib 200 mg capsule  TAKE 1 CAPSULE BY MOUTH TWICE DAILY AS NEEDED FOR 15 DAYS, Disp: , Rfl:    cetirizine (ZYRTEC) 10 MG tablet, Take 1 tablet (10 mg total) by mouth daily., Disp: 30 tablet, Rfl: 0   escitalopram (LEXAPRO) 5 MG tablet, Take 1 tablet (5 mg total) by mouth daily. (Patient not taking: Reported on 11/09/2022), Disp: 90 tablet, Rfl: 3   fluticasone (FLONASE) 50 MCG/ACT nasal spray, Place 2 sprays into both nostrils daily., Disp: 16 g, Rfl: 6  Past Medical Problems: Past Medical History:  Diagnosis Date   Anxiety    Benign paroxysmal positional vertigo 05/08/2022   Colitis    Cyst of spleen    Depression    Ovarian cyst    Renal disorder    renal cyst,     Past Surgical History: Past Surgical History:  Procedure Laterality Date   BACK SURGERY     BIOPSY  11/10/2019   Procedure: BIOPSY;  Surgeon: FiDanie BinderMD;  [facility];  Service: Endoscopy;;   CESAREAN SECTION     CESAREAN SECTION  2006 and 2010   cyst removed from ovary Right    ESOPHAGOGASTRODUODENOSCOPY N/A 11/10/2019   Procedure: ESOPHAGOGASTRODUODENOSCOPY (EGD);  Surgeon: FiDanie BinderMD;  [facility];  Service: Endoscopy;  Laterality: N/A;  12:00pm   LUMBAR LAMINECTOMY/DECOMPRESSION MICRODISCECTOMY Left 05/14/2016   Procedure: LUMBAR DECOMPRESSION/MICRODISCECTOMY LEFT LUMBAR FOUR-FIVE;  Surgeon: DaMelina SchoolsMD;  [place];  Laterality: Left;   ORIF TOE FRACTURE Right 09/07/2019   Procedure: IRRIGATION AND DEBRIDEMENT AND OPEN REDUCTION INTERNAL FIXATION (ORIF) Proximal phalanx FRACTURE;  Surgeon: HaShona NeedlesMD;  [place] [facility]: [facility];  Laterality: Right;   ORIF TOE FRACTURE Right 04/03/2020   Procedure: REPAIR OF RIGHT GREAT TOE NONUNION  WITH BONE GRAFTING RIGHT HIP;  Surgeon: HaShona NeedlesMD;  [place] Service: [place];  Laterality: Right;    TUBAL LIGATION      Social History: Social History   Socioeconomic History   Marital status: Single    Spouse name: Not on file   Number of children: Not on file   Years of education: Not on file   Highest education level: Not on file  Occupational History   Not on file  Tobacco Use   Smoking status: Light Smoker    Packs/day: 0.50    Types: Cigarettes   Smokeless tobacco: Never   Tobacco comments:    trying to quit   Vaping Use   Vaping Use: Former  Substance and Sexual Activity   Alcohol use: Yes    Comment: rare   Drug  use: No   Sexual activity: Yes    Partners: Male    Birth control/protection: Surgical  Other Topics Concern   Not on file  Social History Narrative   Not on file   Social Determinants of Health   Financial Resource Strain: Not on file  Food Insecurity: Not on file  Transportation Needs: Not on file  Physical Activity: Not on file  Stress: Not on file  Social Connections: Not on file  Intimate Partner Violence: Not on file    Family History: Family History  Problem Relation Age of Onset   Diabetes Mother    Hypertension Mother    High Cholesterol Mother    Hypertension Father    Prostate cancer Father    High Cholesterol Father    Colon polyps Maternal Grandmother    Colon cancer Neg Hx     Review of Systems: Denies any recent fevers, chills or changes in her health.  Physical Exam: Vital Signs BP 119/78 (BP Location: Left Arm, Patient Position: Sitting, Cuff Size: Normal)   Pulse 77   Temp 98.1 F (36.7 C) (Oral)   Resp 16   Ht 5' 4"$  (1.626 m)   Wt 169 lb (76.7 kg)   SpO2 99%   BMI 29.01 kg/m   Physical Exam  Constitutional:      General: Not in acute distress.    Appearance: Normal appearance. Not ill-appearing.  HENT:     Head: Normocephalic and atraumatic.  Neck:     Musculoskeletal: Normal range of motion.  Cardiovascular:     Rate and Rhythm: Normal rate Pulmonary:     Effort: Pulmonary effort is normal. No  respiratory distress.  Musculoskeletal: Normal range of motion.  Lower extremities: No varicose veins noted on exam  Skin:    General: Skin is warm and dry.     Findings: No erythema or rash.  Neurological:     Mental Status: Alert and oriented to person, place, and time. Mental status is at baseline.  Psychiatric:        Mood and Affect: Mood normal.        Behavior: Behavior normal.    Assessment/Plan: The patient is scheduled for bilateral breast reduction with Dr. Marla Roe.  Risks, benefits, and alternatives of procedure discussed, questions answered and consent obtained.    Smoking Status: Former smoker, quit 6 months ago; Counseling Given?  I discussed with the patient that she should continue to not smoke and that smoking can greatly inhibit her wound healing.  Patient expressed understanding. Last Mammogram: Per chart review, last mammogram was 12/17/2021.; Results: BI-RADS Category 1, negative  Caprini Score: 4; Risk Factors include: Age, BMI greater than 25, and length of planned surgery. Recommendation for mechanical prophylaxis. Encourage early ambulation.   Pictures obtained: @consult$   Post-op Rx sent to pharmacy: Oxycodone, Zofran, Keflex  I instructed the patient to hold her phentermine, vitamins and ibuprofen 1 week prior to surgery.  Patient expressed understanding.  Patient was provided with the breast reduction and General Surgical Risk consent document and Pain Medication Agreement prior to their appointment.  They had adequate time to read through the risk consent documents and Pain Medication Agreement. We also discussed them in person together during this preop appointment. All of their questions were answered to their satisfaction.  Recommended calling if they have any further questions.  Risk consent form and Pain Medication Agreement to be scanned into patient's chart.  The risk that can be encountered with breast reduction were  discussed and include the  following but not limited to these:  Breast asymmetry, fluid accumulation, firmness of the breast, inability to breast feed, loss of nipple or areola, skin loss, decrease or no nipple sensation, fat necrosis of the breast tissue, bleeding, infection, healing delay.  There are risks of anesthesia, changes to skin sensation and injury to nerves or blood vessels.  The muscle can be temporarily or permanently injured.  You may have an allergic reaction to tape, suture, glue, blood products which can result in skin discoloration, swelling, pain, skin lesions, poor healing.  Any of these can lead to the need for revisonal surgery or stage procedures.  A reduction has potential to interfere with diagnostic procedures.  Nipple or breast piercing can increase risks of infection.  This procedure is best done when the breast is fully developed.  Changes in the breast will continue to occur over time.  Pregnancy can alter the outcomes of previous breast reduction surgery, weight gain and weigh loss can also effect the long term appearance.     Electronically signed by: Clance Boll, PA-C 11/09/2022 11:58 AM

## 2022-11-19 ENCOUNTER — Other Ambulatory Visit: Payer: Self-pay | Admitting: Nurse Practitioner

## 2022-11-19 DIAGNOSIS — E669 Obesity, unspecified: Secondary | ICD-10-CM

## 2022-11-19 MED ORDER — PHENTERMINE HCL 15 MG PO CAPS: 30.0000 mg | ORAL_CAPSULE | Freq: Every day | ORAL | 0 refills | Status: DC

## 2022-11-25 ENCOUNTER — Encounter (HOSPITAL_BASED_OUTPATIENT_CLINIC_OR_DEPARTMENT_OTHER): Payer: Self-pay | Admitting: Plastic Surgery

## 2022-11-27 DIAGNOSIS — Z419 Encounter for procedure for purposes other than remedying health state, unspecified: Secondary | ICD-10-CM | POA: Diagnosis not present

## 2022-12-02 NOTE — Anesthesia Preprocedure Evaluation (Signed)
Anesthesia Evaluation  Patient identified by MRN, date of birth, ID band Patient awake    Reviewed: Allergy & Precautions, NPO status , Patient's Chart, lab work & pertinent test results  History of Anesthesia Complications Negative for: history of anesthetic complications  Airway Mallampati: III  TM Distance: >3 FB Neck ROM: Full    Dental  (+) Dental Advisory Given,    Pulmonary neg shortness of breath, neg sleep apnea, neg COPD, neg recent URI, former smoker   Pulmonary exam normal breath sounds clear to auscultation       Cardiovascular negative cardio ROS  Rhythm:Regular Rate:Normal     Neuro/Psych neg Seizures PSYCHIATRIC DISORDERS Anxiety Depression    BPPV  Neuromuscular disease (lumbar disc herniation)    GI/Hepatic PUD,GERD  ,,Liver lesion   Endo/Other  negative endocrine ROS    Renal/GU Renal disease (renal cyst)     Musculoskeletal   Abdominal   Peds  Hematology negative hematology ROS (+)   Anesthesia Other Findings Last phentermine: 1 week ago  Reproductive/Obstetrics                             Anesthesia Physical Anesthesia Plan  ASA: 2  Anesthesia Plan: General   Post-op Pain Management: Tylenol PO (pre-op)*   Induction: Intravenous  PONV Risk Score and Plan: 3 and Ondansetron, Dexamethasone and Treatment may vary due to age or medical condition  Airway Management Planned: Oral ETT  Additional Equipment:   Intra-op Plan:   Post-operative Plan: Extubation in OR  Informed Consent: I have reviewed the patients History and Physical, chart, labs and discussed the procedure including the risks, benefits and alternatives for the proposed anesthesia with the patient or authorized representative who has indicated his/her understanding and acceptance.     Dental advisory given  Plan Discussed with: CRNA and Anesthesiologist  Anesthesia Plan Comments: (Risks of  general anesthesia discussed including, but not limited to, sore throat, hoarse voice, chipped/damaged teeth, injury to vocal cords, nausea and vomiting, allergic reactions, lung infection, heart attack, stroke, and death. All questions answered. )        Anesthesia Quick Evaluation

## 2022-12-03 ENCOUNTER — Encounter (HOSPITAL_BASED_OUTPATIENT_CLINIC_OR_DEPARTMENT_OTHER): Payer: Self-pay | Admitting: Plastic Surgery

## 2022-12-03 ENCOUNTER — Ambulatory Visit (HOSPITAL_BASED_OUTPATIENT_CLINIC_OR_DEPARTMENT_OTHER)
Admission: RE | Admit: 2022-12-03 | Discharge: 2022-12-03 | Disposition: A | Discharge status: Home / Self Care | Payer: Medicaid Other | Attending: Plastic Surgery | Admitting: Plastic Surgery

## 2022-12-03 ENCOUNTER — Encounter (HOSPITAL_BASED_OUTPATIENT_CLINIC_OR_DEPARTMENT_OTHER)
Admission: RE | Disposition: A | Discharge status: Home / Self Care | Payer: Self-pay | Source: Home / Self Care | Attending: Plastic Surgery

## 2022-12-03 ENCOUNTER — Ambulatory Visit (HOSPITAL_BASED_OUTPATIENT_CLINIC_OR_DEPARTMENT_OTHER): Payer: Medicaid Other | Admitting: Anesthesiology

## 2022-12-03 ENCOUNTER — Other Ambulatory Visit: Payer: Self-pay

## 2022-12-03 DIAGNOSIS — N62 Hypertrophy of breast: Secondary | ICD-10-CM | POA: Diagnosis not present

## 2022-12-03 DIAGNOSIS — F32A Depression, unspecified: Secondary | ICD-10-CM | POA: Diagnosis not present

## 2022-12-03 DIAGNOSIS — F419 Anxiety disorder, unspecified: Secondary | ICD-10-CM | POA: Insufficient documentation

## 2022-12-03 DIAGNOSIS — Z419 Encounter for procedure for purposes other than remedying health state, unspecified: Secondary | ICD-10-CM

## 2022-12-03 DIAGNOSIS — K219 Gastro-esophageal reflux disease without esophagitis: Secondary | ICD-10-CM | POA: Insufficient documentation

## 2022-12-03 DIAGNOSIS — Z87891 Personal history of nicotine dependence: Secondary | ICD-10-CM | POA: Diagnosis not present

## 2022-12-03 DIAGNOSIS — G709 Myoneural disorder, unspecified: Secondary | ICD-10-CM | POA: Insufficient documentation

## 2022-12-03 HISTORY — PX: BREAST REDUCTION SURGERY: SHX8

## 2022-12-03 HISTORY — PX: REDUCTION MAMMAPLASTY: SUR839

## 2022-12-03 LAB — POCT PREGNANCY, URINE: Preg Test, Ur: NEGATIVE

## 2022-12-03 SURGERY — BREAST REDUCTION WITH LIPOSUCTION
Anesthesia: General | Site: Breast | Laterality: Bilateral

## 2022-12-03 MED ORDER — FENTANYL CITRATE (PF) 100 MCG/2ML IJ SOLN: 25.0000 ug | INTRAMUSCULAR | Status: DC | PRN

## 2022-12-03 MED ORDER — SODIUM CHLORIDE 0.9% FLUSH: 3.0000 mL | INTRAVENOUS | Status: DC | PRN

## 2022-12-03 MED ORDER — MIDAZOLAM HCL 5 MG/5ML IJ SOLN
INTRAMUSCULAR | Status: DC | PRN
  Administered 2022-12-03: 2 mg via INTRAVENOUS

## 2022-12-03 MED ORDER — ONDANSETRON HCL 4 MG/2ML IJ SOLN
INTRAMUSCULAR | Status: AC
  Filled 2022-12-03: qty 2

## 2022-12-03 MED ORDER — OXYCODONE HCL 5 MG PO TABS
5.0000 mg | ORAL_TABLET | Freq: Once | ORAL | Status: AC | PRN
  Administered 2022-12-03: 5 mg via ORAL

## 2022-12-03 MED ORDER — LIDOCAINE 2% (20 MG/ML) 5 ML SYRINGE
INTRAMUSCULAR | Status: AC
  Filled 2022-12-03: qty 5

## 2022-12-03 MED ORDER — ROCURONIUM BROMIDE 100 MG/10ML IV SOLN
INTRAVENOUS | Status: DC | PRN
  Administered 2022-12-03: 50 mg via INTRAVENOUS

## 2022-12-03 MED ORDER — OXYCODONE HCL 5 MG PO TABS: 5.0000 mg | ORAL_TABLET | ORAL | Status: DC | PRN

## 2022-12-03 MED ORDER — FENTANYL CITRATE (PF) 100 MCG/2ML IJ SOLN
INTRAMUSCULAR | Status: AC
  Filled 2022-12-03: qty 2

## 2022-12-03 MED ORDER — ACETAMINOPHEN 325 MG RE SUPP: 650.0000 mg | RECTAL | Status: DC | PRN

## 2022-12-03 MED ORDER — FENTANYL CITRATE (PF) 100 MCG/2ML IJ SOLN
25.0000 ug | INTRAMUSCULAR | Status: DC | PRN
  Administered 2022-12-03 (×3): 50 ug via INTRAVENOUS

## 2022-12-03 MED ORDER — LIDOCAINE HCL 1 % IJ SOLN
INTRAVENOUS | Status: DC | PRN
  Administered 2022-12-03: 400 mL

## 2022-12-03 MED ORDER — SUGAMMADEX SODIUM 200 MG/2ML IV SOLN
INTRAVENOUS | Status: DC | PRN
  Administered 2022-12-03: 200 mg via INTRAVENOUS

## 2022-12-03 MED ORDER — OXYCODONE HCL 5 MG PO TABS
ORAL_TABLET | ORAL | Status: AC
  Filled 2022-12-03: qty 1

## 2022-12-03 MED ORDER — CEFAZOLIN SODIUM-DEXTROSE 2-4 GM/100ML-% IV SOLN
INTRAVENOUS | Status: AC
  Filled 2022-12-03: qty 100

## 2022-12-03 MED ORDER — KETOROLAC TROMETHAMINE 30 MG/ML IJ SOLN
INTRAMUSCULAR | Status: AC
  Filled 2022-12-03: qty 1

## 2022-12-03 MED ORDER — LIDOCAINE-EPINEPHRINE 1 %-1:100000 IJ SOLN
INTRAMUSCULAR | Status: DC | PRN
  Administered 2022-12-03: 40 mL via INTRAMUSCULAR

## 2022-12-03 MED ORDER — FENTANYL CITRATE (PF) 100 MCG/2ML IJ SOLN
INTRAMUSCULAR | Status: DC | PRN
  Administered 2022-12-03 (×4): 50 ug via INTRAVENOUS

## 2022-12-03 MED ORDER — PHENYLEPHRINE 80 MCG/ML (10ML) SYRINGE FOR IV PUSH (FOR BLOOD PRESSURE SUPPORT)
PREFILLED_SYRINGE | INTRAVENOUS | Status: AC
  Filled 2022-12-03: qty 10

## 2022-12-03 MED ORDER — DEXAMETHASONE SODIUM PHOSPHATE 4 MG/ML IJ SOLN
INTRAMUSCULAR | Status: DC | PRN
  Administered 2022-12-03: 10 mg via INTRAVENOUS

## 2022-12-03 MED ORDER — AMISULPRIDE (ANTIEMETIC) 5 MG/2ML IV SOLN: 10.0000 mg | Freq: Once | INTRAVENOUS | Status: DC | PRN

## 2022-12-03 MED ORDER — ACETAMINOPHEN 500 MG PO TABS
1000.0000 mg | ORAL_TABLET | Freq: Once | ORAL | Status: AC
  Administered 2022-12-03: 1000 mg via ORAL

## 2022-12-03 MED ORDER — DIPHENHYDRAMINE HCL 25 MG PO CAPS
ORAL_CAPSULE | ORAL | Status: AC
  Filled 2022-12-03: qty 1

## 2022-12-03 MED ORDER — OXYCODONE HCL 5 MG/5ML PO SOLN: 5.0000 mg | Freq: Once | ORAL | Status: AC | PRN

## 2022-12-03 MED ORDER — SODIUM CHLORIDE 0.9 % IV SOLN
INTRAVENOUS | Status: DC | PRN
  Administered 2022-12-03: 40 mL

## 2022-12-03 MED ORDER — LIDOCAINE HCL (CARDIAC) PF 100 MG/5ML IV SOSY
PREFILLED_SYRINGE | INTRAVENOUS | Status: DC | PRN
  Administered 2022-12-03: 80 mg via INTRAVENOUS

## 2022-12-03 MED ORDER — ACETAMINOPHEN 325 MG PO TABS: 650.0000 mg | ORAL_TABLET | ORAL | Status: DC | PRN

## 2022-12-03 MED ORDER — SODIUM CHLORIDE 0.9 % IV SOLN: 250.0000 mL | INTRAVENOUS | Status: DC | PRN

## 2022-12-03 MED ORDER — DIPHENHYDRAMINE HCL 25 MG PO CAPS
25.0000 mg | ORAL_CAPSULE | Freq: Once | ORAL | Status: AC
  Administered 2022-12-03: 25 mg via ORAL

## 2022-12-03 MED ORDER — PROMETHAZINE HCL 25 MG/ML IJ SOLN: 6.2500 mg | INTRAMUSCULAR | Status: DC | PRN

## 2022-12-03 MED ORDER — SODIUM CHLORIDE 0.9% FLUSH: 3.0000 mL | Freq: Two times a day (BID) | INTRAVENOUS | Status: DC

## 2022-12-03 MED ORDER — PROPOFOL 10 MG/ML IV BOLUS
INTRAVENOUS | Status: DC | PRN
  Administered 2022-12-03: 150 mg via INTRAVENOUS

## 2022-12-03 MED ORDER — ACETAMINOPHEN 500 MG PO TABS
ORAL_TABLET | ORAL | Status: AC
  Filled 2022-12-03: qty 2

## 2022-12-03 MED ORDER — PROPOFOL 10 MG/ML IV BOLUS
INTRAVENOUS | Status: AC
  Filled 2022-12-03: qty 20

## 2022-12-03 MED ORDER — ONDANSETRON HCL 4 MG/2ML IJ SOLN
INTRAMUSCULAR | Status: DC | PRN
  Administered 2022-12-03: 4 mg via INTRAVENOUS

## 2022-12-03 MED ORDER — MIDAZOLAM HCL 2 MG/2ML IJ SOLN
INTRAMUSCULAR | Status: AC
  Filled 2022-12-03: qty 2

## 2022-12-03 MED ORDER — DEXAMETHASONE SODIUM PHOSPHATE 10 MG/ML IJ SOLN
INTRAMUSCULAR | Status: AC
  Filled 2022-12-03: qty 1

## 2022-12-03 MED ORDER — LACTATED RINGERS IV SOLN: INTRAVENOUS | Status: DC

## 2022-12-03 MED ORDER — KETOROLAC TROMETHAMINE 30 MG/ML IJ SOLN
30.0000 mg | Freq: Once | INTRAMUSCULAR | Status: AC
  Administered 2022-12-03: 30 mg via INTRAVENOUS

## 2022-12-03 MED ORDER — CHLORHEXIDINE GLUCONATE CLOTH 2 % EX PADS: 6.0000 | MEDICATED_PAD | Freq: Once | CUTANEOUS | Status: DC

## 2022-12-03 MED ORDER — CEFAZOLIN SODIUM-DEXTROSE 2-4 GM/100ML-% IV SOLN
2.0000 g | INTRAVENOUS | Status: AC
  Administered 2022-12-03: 2 g via INTRAVENOUS

## 2022-12-03 SURGICAL SUPPLY — 64 items
ADH SKN CLS APL DERMABOND .7 (GAUZE/BANDAGES/DRESSINGS) ×2
AGENT HMST PWDR BTL CLGN 5GM (Miscellaneous) ×1 IMPLANT
BAG DECANTER FOR FLEXI CONT (MISCELLANEOUS) ×1 IMPLANT
BINDER BREAST LRG (GAUZE/BANDAGES/DRESSINGS) IMPLANT
BINDER BREAST MEDIUM (GAUZE/BANDAGES/DRESSINGS) IMPLANT
BINDER BREAST XLRG (GAUZE/BANDAGES/DRESSINGS) IMPLANT
BINDER BREAST XXLRG (GAUZE/BANDAGES/DRESSINGS) IMPLANT
BIOPATCH RED 1 DISK 7.0 (GAUZE/BANDAGES/DRESSINGS) IMPLANT
BLADE HEX COATED 2.75 (ELECTRODE) IMPLANT
BLADE KNIFE PERSONA 10 (BLADE) ×2 IMPLANT
BLADE SURG 15 STRL LF DISP TIS (BLADE) ×1 IMPLANT
BLADE SURG 15 STRL SS (BLADE) ×1
CANISTER SUCT 1200ML W/VALVE (MISCELLANEOUS) ×1 IMPLANT
COLLAGEN CELLERATERX 5 GRAM (Miscellaneous) IMPLANT
COVER BACK TABLE 60X90IN (DRAPES) ×1 IMPLANT
COVER MAYO STAND STRL (DRAPES) ×1 IMPLANT
DERMABOND ADVANCED .7 DNX12 (GAUZE/BANDAGES/DRESSINGS) ×2 IMPLANT
DRAIN CHANNEL 19F RND (DRAIN) IMPLANT
DRAPE LAPAROSCOPIC ABDOMINAL (DRAPES) ×1 IMPLANT
DRSG MEPILEX POST OP 4X8 (GAUZE/BANDAGES/DRESSINGS) ×2 IMPLANT
ELECT BLADE 4.0 EZ CLEAN MEGAD (MISCELLANEOUS) ×1
ELECT REM PT RETURN 9FT ADLT (ELECTROSURGICAL) ×1
ELECTRODE BLDE 4.0 EZ CLN MEGD (MISCELLANEOUS) ×1 IMPLANT
ELECTRODE REM PT RTRN 9FT ADLT (ELECTROSURGICAL) ×1 IMPLANT
EVACUATOR SILICONE 100CC (DRAIN) IMPLANT
GAUZE PAD ABD 8X10 STRL (GAUZE/BANDAGES/DRESSINGS) ×2 IMPLANT
GLOVE BIO SURGEON STRL SZ 6.5 (GLOVE) ×3 IMPLANT
GLOVE BIO SURGEON STRL SZ7.5 (GLOVE) ×1 IMPLANT
GOWN STRL REUS W/ TWL LRG LVL3 (GOWN DISPOSABLE) ×2 IMPLANT
GOWN STRL REUS W/ TWL XL LVL3 (GOWN DISPOSABLE) ×1 IMPLANT
GOWN STRL REUS W/TWL LRG LVL3 (GOWN DISPOSABLE) ×2
GOWN STRL REUS W/TWL XL LVL3 (GOWN DISPOSABLE) ×1
NDL FILTER BLUNT 18X1 1/2 (NEEDLE) IMPLANT
NDL HYPO 25X1 1.5 SAFETY (NEEDLE) ×1 IMPLANT
NEEDLE FILTER BLUNT 18X1 1/2 (NEEDLE) IMPLANT
NEEDLE HYPO 25X1 1.5 SAFETY (NEEDLE) ×2 IMPLANT
NS IRRIG 1000ML POUR BTL (IV SOLUTION) ×1 IMPLANT
PACK BASIN DAY SURGERY FS (CUSTOM PROCEDURE TRAY) ×1 IMPLANT
PAD ALCOHOL SWAB (MISCELLANEOUS) IMPLANT
PAD FOAM SILICONE BACKED (GAUZE/BANDAGES/DRESSINGS) IMPLANT
PENCIL SMOKE EVACUATOR (MISCELLANEOUS) ×1 IMPLANT
PIN SAFETY STERILE (MISCELLANEOUS) IMPLANT
SLEEVE SCD COMPRESS KNEE MED (STOCKING) ×1 IMPLANT
SPIKE FLUID TRANSFER (MISCELLANEOUS) IMPLANT
SPONGE T-LAP 18X18 ~~LOC~~+RFID (SPONGE) ×2 IMPLANT
STRIP SUTURE WOUND CLOSURE 1/2 (MISCELLANEOUS) ×2 IMPLANT
SUT MNCRL AB 4-0 PS2 18 (SUTURE) ×4 IMPLANT
SUT MON AB 3-0 SH 27 (SUTURE) ×7
SUT MON AB 3-0 SH27 (SUTURE) ×4 IMPLANT
SUT MON AB 5-0 PS2 18 (SUTURE) IMPLANT
SUT PDS 3-0 CT2 (SUTURE) ×4
SUT PDS II 3-0 CT2 27 ABS (SUTURE) ×4 IMPLANT
SUT SILK 3 0 PS 1 (SUTURE) IMPLANT
SYR 50ML LL SCALE MARK (SYRINGE) IMPLANT
SYR BULB IRRIG 60ML STRL (SYRINGE) ×1 IMPLANT
SYR CONTROL 10ML LL (SYRINGE) ×1 IMPLANT
TAPE MEASURE VINYL STERILE (MISCELLANEOUS) IMPLANT
TOWEL GREEN STERILE FF (TOWEL DISPOSABLE) ×2 IMPLANT
TRAY DSU PREP LF (CUSTOM PROCEDURE TRAY) ×1 IMPLANT
TUBE CONNECTING 20X1/4 (TUBING) ×1 IMPLANT
TUBING INFILTRATION IT-10001 (TUBING) IMPLANT
TUBING SET GRADUATE ASPIR 12FT (MISCELLANEOUS) IMPLANT
UNDERPAD 30X36 HEAVY ABSORB (UNDERPADS AND DIAPERS) ×2 IMPLANT
YANKAUER SUCT BULB TIP NO VENT (SUCTIONS) ×1 IMPLANT

## 2022-12-03 NOTE — Interval H&P Note (Signed)
History and Physical Interval Note:  12/03/2022 1:27 PM  Lindsey Sampson  has presented today for surgery, with the diagnosis of macromastia.  The various methods of treatment have been discussed with the patient and family. After consideration of risks, benefits and other options for treatment, the patient has consented to  Procedure(s): BREAST REDUCTION WITH LIPOSUCTION (Bilateral) as a surgical intervention.  The patient's history has been reviewed, patient examined, no change in status, stable for surgery.  I have reviewed the patient's chart and labs.  Questions were answered to the patient's satisfaction.     Loel Lofty Karolyn Messing

## 2022-12-03 NOTE — Op Note (Signed)
Breast Reduction Op note:    DATE OF PROCEDURE: 12/03/2022  LOCATION: [place]  SURGEON: Lyndee Leo Sanger Gottfried Standish, DO  ASSISTANT: Lenn Sink, PA  PREOPERATIVE DIAGNOSIS 1. Macromastia 2. Neck Pain 3. Back Pain  POSTOPERATIVE DIAGNOSIS 1. Macromastia 2. Neck Pain 3. Back Pain  PROCEDURES 1. Bilateral breast reduction.  Right reduction 550 g, Left reduction [id number] g  COMPLICATIONS: None.  DRAINS: none  INDICATIONS FOR PROCEDURE Lindsey Sampson is a 42 y.o. year-old female born on May 19, 1981,with a history of symptomatic macromastia with concominant back pain, neck pain, shoulder grooving from her bra.   MRN: [id number]  CONSENT Informed consent was obtained directly from the patient. The risks, benefits and alternatives were fully discussed. Specific risks including but not limited to bleeding, infection, hematoma, seroma, scarring, pain, nipple necrosis, asymmetry, poor cosmetic results, and need for further surgery were discussed. The patient's questions were answered.  DESCRIPTION OF PROCEDURE  Patient was brought into the operating room and rested on the operating room table in the supine position.  SCDs were placed and appropriate padding was performed.  Antibiotics were given. The patient underwent general anesthesia and the chest was prepped and draped in a sterile fashion.  A timeout was performed and all information was confirmed to be correct by those in the room.  Right side: Preoperative markings were confirmed.  Incision lines were injected with local containing epinephrine.  After waiting for vasoconstriction, the marked lines were incised with a #15 blade.  A Wise-pattern superomedial breast reduction was performed by de-epithelializing the pedicle, using bovie to create the superomedial pedicle, and removing breast tissue from the superiolateral and inferior portions of the breast.  Care was taken to not undermine the breast pedicle. Hemostasis  was achieved.  The nipple was gently rotated into position and the soft tissue closed with 4-0 Monocryl.   The pocket was irrigated and hemostasis confirmed.  The deep tissues were approximated with 3-0 PDS sutures.  The skin was closed with deep dermal 3-0 Monocryl and subcuticular 4-0 Monocryl sutures.  The nipple and skin flaps had good capillary refill at the end of the procedure.    Left side: Preoperative markings were confirmed.  Incision lines were injected with local containing epinephrine.  After waiting for vasoconstriction, the marked lines were incised with a #15 blade.  A Wise-pattern superomedial breast reduction was performed by de-epithelializing the pedicle, using bovie to create the superomedial pedicle, and removing breast tissue from the superiolateral and inferior portions of the breast.  Care was taken to not undermine the breast pedicle. Hemostasis was achieved.  The nipple was gently rotated into position and the soft tissue was closed with 4-0 Monocryl.  The patient was sat upright and size and shape symmetry was confirmed.  The pocket was irrigated and hemostasis confirmed.  The deep tissues were approximated with 3-0 PDS sutures. Experel and Cellerate were placed in each pocket. The skin was closed with deep dermal 3-0 Monocryl and subcuticular 4-0 Monocryl sutures.  Dermabond was applied.  A breast binder and ABDs were placed.  The nipple and skin flaps had good capillary refill at the end of the procedure.  The patient tolerated the procedure well. The patient was allowed to wake from anesthesia and taken to the recovery room in satisfactory condition.  The advanced practice practitioner (APP) assisted throughout the case.  The APP was essential in retraction and counter traction when needed to make the case progress smoothly.  This retraction and assistance  made it possible to see the tissue plans for the procedure.  The assistance was needed for blood control, tissue  re-approximation and assisted with closure of the incision site.

## 2022-12-03 NOTE — Anesthesia Procedure Notes (Signed)
Procedure Name: Intubation Date/Time: 12/03/2022 2:22 PM  Performed by: Tawni Millers, CRNAPre-anesthesia Checklist: Patient identified, Emergency Drugs available, Suction available and Patient being monitored Patient Re-evaluated:Patient Re-evaluated prior to induction Oxygen Delivery Method: Circle system utilized Preoxygenation: Pre-oxygenation with 100% oxygen Induction Type: IV induction Ventilation: Mask ventilation without difficulty Laryngoscope Size: Mac and 4 Grade View: Grade II Tube type: Oral Tube size: 7.0 mm Number of attempts: 1 Airway Equipment and Method: Stylet and Oral airway Placement Confirmation: ETT inserted through vocal cords under direct vision, positive ETCO2 and breath sounds checked- equal and bilateral Tube secured with: Tape Dental Injury: Teeth and Oropharynx as per pre-operative assessment

## 2022-12-03 NOTE — Transfer of Care (Signed)
Immediate Anesthesia Transfer of Care Note  Patient: Lindsey Sampson  Procedure(s) Performed: BREAST REDUCTION WITH LIPOSUCTION (Bilateral: Breast)  Patient Location: PACU  Anesthesia Type:General  Level of Consciousness: awake  Airway & Oxygen Therapy: Patient Spontanous Breathing and Patient connected to face mask oxygen  Post-op Assessment: Report given to RN and Post -op Vital signs reviewed and stable  Post vital signs: Reviewed and stable  Last Vitals:  Vitals Value Taken Time  BP 128/83 12/03/22 1622  Temp    Pulse 104 12/03/22 1623  Resp 14 12/03/22 1623  SpO2 100 % 12/03/22 1623  Vitals shown include unvalidated device data.  Last Pain:  Vitals:   12/03/22 1051  TempSrc: Oral  PainSc: 0-No pain      Patients Stated Pain Goal: 3 ([id number])  Complications: No notable events documented.

## 2022-12-03 NOTE — Anesthesia Postprocedure Evaluation (Signed)
Anesthesia Post Note  Patient: Lindsey Sampson  Procedure(s) Performed: BREAST REDUCTION WITH LIPOSUCTION (Bilateral: Breast)     Patient location during evaluation: PACU Anesthesia Type: General Level of consciousness: awake Pain management: pain level controlled Vital Signs Assessment: post-procedure vital signs reviewed and stable Respiratory status: spontaneous breathing, nonlabored ventilation and respiratory function stable Cardiovascular status: blood pressure returned to baseline and stable Postop Assessment: no apparent nausea or vomiting Anesthetic complications: no   No notable events documented.  Last Vitals:  Vitals:   12/03/22 1630 12/03/22 1645  BP: 129/75 118/70  Pulse: 100 100  Resp: 17 (!) 22  Temp:    SpO2: 100% 99%    Last Pain:  Vitals:   12/03/22 1630  TempSrc:   PainSc: 7                  Nilda Simmer

## 2022-12-03 NOTE — Discharge Instructions (Addendum)
INSTRUCTIONS FOR AFTER BREAST SURGERY   You will likely have some questions about what to expect following your operation.  The following information will help you and your family understand what to expect when you are discharged from the hospital.  It is important to follow these guidelines to help ensure a smooth recovery and reduce complication.  Postoperative instructions include information on: diet, wound care, medications and physical activity.  AFTER SURGERY Expect to go home after the procedure.  In some cases, you may need to spend one night in the hospital for observation.  DIET Breast surgery does not require a specific diet.  However, the healthier you eat the better your body will heal. It is important to increasing your protein intake.  This means limiting the foods with sugar and carbohydrates.  Focus on vegetables and some meat.  If you have liposuction during your procedure be sure to drink water.  If your urine is bright yellow, then it is concentrated, and you need to drink more water.  As a general rule after surgery, you should have 8 ounces of water every hour while awake.  If you find you are persistently nauseated or unable to take in liquids let us know.  NO TOBACCO USE or EXPOSURE.  This will slow your healing process and lead to a wound.  WOUND CARE Leave the binder on for 3 days . Use fragrance free soap like Dial, Dove or [place].   After 3 days you can remove the binder to shower. Once dry apply binder or sports bra. If you have liposuction you will have a soft and spongy dressing (Lipofoam) that helps prevent creases in your skin.  Remove before you shower and then replace it.  It is also available on [employer]. If you have steri-strips / tape directly attached to your skin leave them in place. It is OK to get these wet.   No baths, pools or hot tubs for four weeks. We close your incision to leave the smallest and best-looking scar. No ointment or creams on your incisions  for four weeks.  No Neosporin (Too many skin reactions).  A few weeks after surgery you can use Mederma and start massaging the scar. We ask you to wear your binder or sports bra for the first 6 weeks around the clock, including while sleeping. This provides added comfort and helps reduce the fluid accumulation at the surgery site. NO Ice or heating pads to the operative site.  You have a very high risk of a BURN before you feel the temperature change.  ACTIVITY No heavy lifting until cleared by the doctor.  This usually means no more than a half-gallon of milk.  It is OK to walk and climb stairs. Moving your legs is very important to decrease your risk of a blood clot.  It will also help keep you from getting deconditioned.  Every 1 to 2 hours get up and walk for 5 minutes. This will help with a quicker recovery back to normal.  Let pain be your guide so you don't do too much.  This time is for you to recover.  You will be more comfortable if you sleep and rest with your head elevated either with a few pillows under you or in a recliner.  No stomach sleeping for a three months.  WORK Everyone returns to work at different times. As a rough guide, most people take at least 1 - 2 weeks off prior to returning to work. If  you need documentation for your job, give the forms to the front staff at the clinic.  DRIVING Arrange for someone to bring you home from the hospital after your surgery.  You may be able to drive a few days after surgery but not while taking any narcotics or valium.  BOWEL MOVEMENTS Constipation can occur after anesthesia and while taking pain medication.  It is important to stay ahead for your comfort.  We recommend taking Milk of Magnesia (2 tablespoons; twice a day) while taking the pain pills.  MEDICATIONS You may be prescribed should start after surgery At your preoperative visit for you history and physical you may have been given the following medications: An antibiotic: Start  this medication when you get home and take according to the instructions on the bottle. Zofran 4 mg:  This is to treat nausea and vomiting.  You can take this every 6 hours as needed and only if needed. Valium 2 mg for breast cancer patients: This is for muscle tightness if you have an implant or expander. This will help relax your muscle which also helps with pain control.  This can be taken every 12 hours as needed. Don't drive after taking this medication. Norco (hydrocodone/acetaminophen) 5/325 mg:  This is only to be used after you have taken the Motrin or the Tylenol. Every 8 hours as needed.   Over the counter Medication to take: Ibuprofen (Motrin) 600 mg:  Take this every 6 hours.  If you have additional pain then take 500 mg of the Tylenol every 8 hours.  Only take the Norco after you have tried these two. MiraLAX or Milk of Magnesia: Take this according to the bottle if you take the [place] Call your surgeon's office if any of the following occur: Fever 101 degrees F or greater Excessive bleeding or fluid from the incision site. Pain that increases over time without aid from the medications Redness, warmth, or pus draining from incision sites Persistent nausea or inability to take in liquids Severe misshapen area that underwent the operation.   Post Anesthesia Home Care Instructions  Activity: Get plenty of rest for the remainder of the day. A responsible individual must stay with you for 24 hours following the procedure.  For the next 24 hours, DO NOT: -Drive a car -Paediatric nurse -Drink alcoholic beverages -Take any medication unless instructed by your physician -Make any legal decisions or sign important papers.  Meals: Start with liquid foods such as gelatin or soup. Progress to regular foods as tolerated. Avoid greasy, spicy, heavy foods. If nausea and/or vomiting occur, drink only clear liquids until the nausea and/or vomiting subsides. Call your  physician if vomiting continues.  Special Instructions/Symptoms: Your throat may feel dry or sore from the anesthesia or the breathing tube placed in your throat during surgery. If this causes discomfort, gargle with warm salt water. The discomfort should disappear within 24 hours.  If you had a scopolamine patch placed behind your ear for the management of post- operative nausea and/or vomiting:  1. The medication in the patch is effective for 72 hours, after which it should be removed.  Wrap patch in a tissue and discard in the trash. Wash hands thoroughly with soap and water. 2. You may remove the patch earlier than 72 hours if you experience unpleasant side effects which may include dry mouth, dizziness or visual disturbances. 3. Avoid touching the patch. Wash your hands with soap and water after contact with the  patch.    Do not take Ibuprofen/NSAIDS before 1am 12/04/22.  You had '5mg'$  of Oxycodone at 5:30pm.

## 2022-12-04 ENCOUNTER — Ambulatory Visit (INDEPENDENT_AMBULATORY_CARE_PROVIDER_SITE_OTHER): Payer: Medicaid Other | Admitting: Physician Assistant

## 2022-12-04 ENCOUNTER — Encounter: Payer: Medicaid Other | Admitting: Plastic Surgery

## 2022-12-04 ENCOUNTER — Encounter (HOSPITAL_BASED_OUTPATIENT_CLINIC_OR_DEPARTMENT_OTHER): Payer: Self-pay | Admitting: Plastic Surgery

## 2022-12-04 DIAGNOSIS — N62 Hypertrophy of breast: Secondary | ICD-10-CM

## 2022-12-04 NOTE — Progress Notes (Signed)
This is a a 42 year old female status post bilateral breast reduction yesterday 12/03/2022 by Dr. Marla Roe. The patient is located in [place], I am currently at our office in [place].  The patient notes she is having some soreness but no severe pain, no fevers, no issues to note today.  Exam:  Phone encounter, patient speaking in full sentences no acute distress.  Overall the patient is doing very well from a postoperative standpoint, she has a scheduled follow-up on 12/11/2022 with Dr. Marla Roe.  She was given strict return precautions, she verbalized understanding and agreement to today's plan had no further questions or concerns.

## 2022-12-07 ENCOUNTER — Other Ambulatory Visit: Payer: Self-pay

## 2022-12-07 LAB — SURGICAL PATHOLOGY

## 2022-12-11 ENCOUNTER — Ambulatory Visit (INDEPENDENT_AMBULATORY_CARE_PROVIDER_SITE_OTHER): Payer: Medicaid Other | Admitting: Plastic Surgery

## 2022-12-11 ENCOUNTER — Encounter: Payer: Self-pay | Admitting: Plastic Surgery

## 2022-12-11 VITALS — BP 126/82 | HR 96 | Ht 64.0 in | Wt 168.0 lb

## 2022-12-11 DIAGNOSIS — N62 Hypertrophy of breast: Secondary | ICD-10-CM

## 2022-12-11 MED ORDER — KETOROLAC TROMETHAMINE 10 MG PO TABS: 10.0000 mg | ORAL_TABLET | Freq: Two times a day (BID) | ORAL | 0 refills | Status: DC | PRN

## 2022-12-11 NOTE — Progress Notes (Signed)
   Subjective:    Patient ID: Lindsey Sampson, female    DOB: 08-21-1981, 42 y.o.   MRN: [id number]  The patient is a 42 year old female here for follow-up after undergoing bilateral breast reduction on 12/03/22.  She had over 500 g removed from each breast.  She has some swelling and bruising as expected. Still having some pain at night.      Review of Systems  Constitutional:  Positive for activity change.  Eyes: Negative.   Respiratory: Negative.    Cardiovascular: Negative.   Gastrointestinal: Negative.   Endocrine: Negative.        Objective:   Physical Exam Vitals reviewed.  Constitutional:      Appearance: Normal appearance.  Cardiovascular:     Rate and Rhythm: Normal rate.     Pulses: Normal pulses.  Skin:    Capillary Refill: Capillary refill takes less than 2 seconds.  Neurological:     Mental Status: She is alert and oriented to person, place, and time.  Psychiatric:        Mood and Affect: Mood normal.        Behavior: Behavior normal.        Thought Content: Thought content normal.        Judgment: Judgment normal.        Assessment & Plan:     ICD-10-CM   1. Symptomatic mammary hypertrophy  N62       Continue with the binder or sports bra.  No heavy lifting.  Follow up in 2 weeks. Will send in Toradol.  Pictures were obtained of the patient and placed in the chart with the patient's or guardian's permission.

## 2022-12-15 ENCOUNTER — Encounter: Payer: Medicaid Other | Admitting: Surgical

## 2022-12-17 ENCOUNTER — Encounter: Payer: Self-pay | Admitting: Plastic Surgery

## 2022-12-17 NOTE — Progress Notes (Signed)
Patient is a 42 year old female here for follow-up after bilateral breast reduction with liposuction with Dr. Marla Roe on 12/03/2022.  She is just over 2 weeks postop  She is presents today with concerns about bleeding, increased pain and redness to her breasts  Patient reports that few days ago she noticed some drainage from her breast that appeared bloody.  She reports that she has also noticed some tenderness to bilateral breasts.  She reports that the bruising has significantly improved.  She did try to use some of the Toradol but reports this caused a lot of abdominal pain and she therefore stopped it.  She has been using Tylenol but no ibuprofen.  Chaperone present on exam On exam bilateral NAC's are viable, bilateral breast incisions are intact and healing well.  She does have some irritation along the inframammary folds of bilateral breast, particularly the medial aspect of the right breast.  There is no cellulitic changes or subcutaneous fluid collections noted palpation.  Ecchymosis is mostly resolved at this point.  She has some tenderness with palpation, particularly the medial right breast and lateral left breast.  A/P:  Recommend holding the Toradol due to the stomach pain she is having from it.  Abdominal pain has resolved after stopping the medication.  She is not having any infectious symptoms.  I do not see any overt signs of infection on exam today.  Recommend continue to monitor closely.  I recommend using Tylenol and ibuprofen for pain control at this point, it would not feel comfortable prescribing narcotics at this time.  Patient was agreeable to this.  Pictures were taken and placed in her chart with her permission.  Will plan to see her back in 1 week she knows to call if symptoms worsen or change.  We discussed return precautions.

## 2022-12-18 ENCOUNTER — Ambulatory Visit (INDEPENDENT_AMBULATORY_CARE_PROVIDER_SITE_OTHER): Payer: Medicaid Other | Admitting: Surgical

## 2022-12-18 VITALS — BP 111/79 | HR 85

## 2022-12-18 DIAGNOSIS — N62 Hypertrophy of breast: Secondary | ICD-10-CM

## 2022-12-25 ENCOUNTER — Ambulatory Visit (INDEPENDENT_AMBULATORY_CARE_PROVIDER_SITE_OTHER): Payer: Medicaid Other | Admitting: Surgical

## 2022-12-25 ENCOUNTER — Encounter: Payer: Medicaid Other | Admitting: Surgical

## 2022-12-25 DIAGNOSIS — N62 Hypertrophy of breast: Secondary | ICD-10-CM

## 2022-12-25 MED ORDER — TRIAMCINOLONE ACETONIDE 0.025 % EX OINT: 1.0000 | TOPICAL_OINTMENT | Freq: Two times a day (BID) | CUTANEOUS | 0 refills | Status: DC

## 2022-12-25 NOTE — Progress Notes (Signed)
Patient is a 42 year old female here for follow-up after bilateral breast reduction with liposuction with Dr. Marla Roe on 12/03/2022.  She is 3 weeks postop.  She reports overall she is doing well, reports however she has been having some itching of her bilateral breasts.  She is not having any infectious symptoms.  Chaperone present on exam On exam bilateral NAC are viable, bilateral breast incisions are overall intact and healing well.  She does have a small wound at the T-junction of the left breast that is approximately 2 x 2 mm.  There is no surrounding cellulitic changes.  She does have some erythema of bilateral breasts along the inframammary folds, appears irritated in appearance.  She also has a maculopapular rash above the bilateral nipple areolar complexes.  No subcutaneous fluid collections noted.  A/P:  Patient is overall doing well after bilateral breast reduction surgery, she does have a rash of bilateral breasts as well as some possible intertrigo within the inframammary fold.  It is difficult to determine if she is having intertriginous rash or a maculopapular rash within the breast fold.  We discussed that overall I do not see any signs infection or concern on exam, however for the rash on the upper part of her breast above the nipple areola, recommend using a steroid.  I will send this to her pharmacy.  We discussed that the folds of her breast also have redness that appears to be a rash versus intertrigo, unable to definitively determine.  We discussed using Vaseline and gauze in this area to keep it dry.  We explicitly discussed to not use the steroid cream in this area as it may cause worsening of the symptoms if it is intertrigo.  I would like to see the patient back in 2 weeks for reevaluation.  I do not see any signs of infection on exam.  We discussed calling if her symptoms worsen or change.

## 2022-12-29 ENCOUNTER — Encounter: Payer: Medicaid Other | Admitting: Surgical

## 2022-12-31 ENCOUNTER — Ambulatory Visit (INDEPENDENT_AMBULATORY_CARE_PROVIDER_SITE_OTHER): Payer: Medicaid Other | Admitting: Nurse Practitioner

## 2022-12-31 VITALS — BP 116/78 | HR 80 | Temp 97.8°F | Ht 64.0 in | Wt 169.0 lb

## 2022-12-31 DIAGNOSIS — E669 Obesity, unspecified: Secondary | ICD-10-CM | POA: Diagnosis not present

## 2022-12-31 DIAGNOSIS — F419 Anxiety disorder, unspecified: Secondary | ICD-10-CM | POA: Diagnosis not present

## 2022-12-31 DIAGNOSIS — Z6830 Body mass index (BMI) 30.0-30.9, adult: Secondary | ICD-10-CM | POA: Diagnosis not present

## 2022-12-31 MED ORDER — PHENTERMINE HCL 15 MG PO CAPS: 30.0000 mg | ORAL_CAPSULE | Freq: Every day | ORAL | 0 refills | Status: DC

## 2022-12-31 NOTE — Assessment & Plan Note (Addendum)
Chronic, weight up 1 pound since last office visit.  I am agreeable to restarting phentermine for 12 weeks.  Patient to start 15 mg daily x 1 week and then increase to 30 mg daily.  Also continue to focus on lifestyle modification with calorie deficit, low glycemic index diet, and exercise 150 minutes/week as long as she has been cleared by surgeon to do so.  Patient to follow-up in 1 month or sooner as needed.  Patient has had tubal ligation.

## 2022-12-31 NOTE — Progress Notes (Signed)
Established Patient Office Visit  Subjective   Patient ID: Lindsey Sampson, female    DOB: [birth year]  Age: 42 y.o. MRN: [id number]  Chief Complaint  Patient presents with   Medical Management of Chronic Issues    Follow up, no new concern    Obesity: Reports has been off of phentermine for 4 to 5 weeks as she had to stop it prior to undergoing breast reduction surgery.  Reports that she is healing well from surgery and feels good overall.  Denies any chest pain or shortness of breath today.  Anxiety: Continues on Lexapro Wellbutrin, reports mood stable.    Review of Systems  Respiratory:  Negative for shortness of breath.   Cardiovascular:  Negative for chest pain and palpitations.  Neurological:  Negative for dizziness and headaches.  Psychiatric/Behavioral:  Negative for depression and suicidal ideas.       Objective:     BP 116/78   Pulse 80   Temp 97.8 F (36.6 C) (Temporal)   Ht 5\' 4"  (1.626 m)   Wt 169 lb (76.7 kg)   LMP 12/13/2022 (Approximate)   SpO2 99%   BMI 29.01 kg/m  BP Readings from Last 3 Encounters:  12/31/22 116/78  12/18/22 111/79  12/11/22 126/82   Wt Readings from Last 3 Encounters:  12/31/22 169 lb (76.7 kg)  12/11/22 168 lb (76.2 kg)  12/03/22 165 lb 2 oz (74.9 kg)      Physical Exam Vitals reviewed.  Constitutional:      General: She is not in acute distress.    Appearance: Normal appearance.  HENT:     Head: Normocephalic and atraumatic.  Neck:     Vascular: No carotid bruit.  Cardiovascular:     Rate and Rhythm: Normal rate and regular rhythm.     Pulses: Normal pulses.     Heart sounds: Normal heart sounds.  Pulmonary:     Effort: Pulmonary effort is normal.     Breath sounds: Normal breath sounds.  Skin:    General: Skin is warm and dry.  Neurological:     General: No focal deficit present.     Mental Status: She is alert and oriented to person, place, and time.  Psychiatric:        Mood and Affect: Mood normal.         Behavior: Behavior normal.        Judgment: Judgment normal.      No results found for any visits on 12/31/22.    The 10-year ASCVD risk score (Arnett DK, et al., 2019) is: 3.9%    Assessment & Plan:   Problem List Items Addressed This Visit       Other   Anxiety - Primary    Chronic, well-controlled on Lexapro 5 mg daily and Wellbutrin 150 mg daily.  Continue medication as currently prescribed.  Patient has history of tubal ligation.      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  I am agreeable to restarting phentermine for 12 weeks.  Patient to start 15 mg daily x 1 week and then increase to 30 mg daily.  Also continue to focus on lifestyle modification with calorie deficit, low glycemic index diet, and exercise 150 minutes/week as long as she has been cleared by surgeon to do so.  Patient to follow-up in 1 month or sooner  as needed.  Patient has had tubal ligation.      Relevant Medications   phentermine 15 MG capsule    Return in about 1 month (around 01/30/2023) for F/U with Judson Roch, may be virtual.    Ailene Ards, NP

## 2022-12-31 NOTE — Assessment & Plan Note (Signed)
Chronic, well-controlled on Lexapro 5 mg daily and Wellbutrin 150 mg daily.  Continue medication as currently prescribed.  Patient has history of tubal ligation.

## 2023-01-01 ENCOUNTER — Encounter: Payer: Medicaid Other | Admitting: Surgical

## 2023-01-08 ENCOUNTER — Ambulatory Visit (INDEPENDENT_AMBULATORY_CARE_PROVIDER_SITE_OTHER): Payer: Medicaid Other | Admitting: Surgical

## 2023-01-08 ENCOUNTER — Encounter: Payer: Self-pay | Admitting: Plastic Surgery

## 2023-01-08 DIAGNOSIS — N62 Hypertrophy of breast: Secondary | ICD-10-CM

## 2023-01-08 DIAGNOSIS — Z9889 Other specified postprocedural states: Secondary | ICD-10-CM

## 2023-01-08 NOTE — Progress Notes (Signed)
Patient is a 42 year old female here for follow-up after bilateral breast reduction with liposuction on 12/03/2022.  She is 5 weeks postop.  At her last appointment it appeared she had a maculopapular rash, it was recommended to begin using steroid cream on this area.  Recommended avoiding using it within the inframammary fold.  She is here today for reevaluation.  She reports that the rash of her upper breasts is much better.  She does continue to have a wound of the left T-junction.  She is not having any infectious symptoms.  She has returned to work and this is going well.  She is no longer taking any narcotics or pain medication.  Chaperone present on exam On exam bilateral NAC are viable, bilateral breast incisions are overall healing well.  She does have a small wound of the left T-junction that is approximately 5 x 5 mm.  There is no surrounding erythema or cellulitic changes noted. No subcutaneous fluid collection noted of either breast.  No rash noted.    She does have some mild redness surrounding the bilateral NAC, but this does not appear infectious in etiology, appears more related to pressure from compression garment.  A/P:  Recommend 1 more week of restrictions including continuous compressive garment 24/7, avoiding heavy lifting greater than 20 pounds.  After 1 more week she can increase activity and lifting greater than 20 pounds as tolerated.  Recommend Vaseline and gauze to left breast T-junction wound daily.  We discussed this may heal over the next few weeks.  I would like to see her back in 2 to 3 weeks for reevaluation.  She can return to work, will provide her with letter today.  She is a [employer].

## 2023-01-13 ENCOUNTER — Telehealth: Payer: Self-pay

## 2023-01-13 NOTE — Telephone Encounter (Signed)
-----   Message from Ferdie Ping, CMA sent at 12/31/2022  8:58 AM EDT ----- Regarding: Pap smear

## 2023-01-25 NOTE — Telephone Encounter (Signed)
Request send

## 2023-01-28 NOTE — Progress Notes (Signed)
Patient is a 42 year old female here for follow-up after bilateral breast reduction with liposuction on 12/03/2022.  She is 8 weeks postop.  She did have a wound of the left breast T-junction, recommended to apply Vaseline to this area.  She reports today that the area is well-healed.  She reports she is overall doing well.  She is not having any issues at this time.  She is very healed with her recovery.  She has returned to work without any issues.  Chaperone present on exam On exam bilateral NAC's are viable, bilateral breast incisions are intact and well-healed.  There is no erythema or cellulitic changes noted.  No subcutaneous fluid collection noted of either breast.  Bilateral breasts are symmetric  A/P:  Patient is doing well after breast reduction, no restrictions at this time.  All of her questions were answered to her content.  Pictures were taken and placed in her chart with her permission.  Follow-up as needed.

## 2023-01-29 ENCOUNTER — Encounter: Payer: Self-pay | Admitting: Surgical

## 2023-01-29 ENCOUNTER — Ambulatory Visit (INDEPENDENT_AMBULATORY_CARE_PROVIDER_SITE_OTHER): Payer: Medicaid Other | Admitting: Surgical

## 2023-01-29 VITALS — BP 131/83 | HR 86 | Ht 64.0 in | Wt 165.0 lb

## 2023-01-29 DIAGNOSIS — N62 Hypertrophy of breast: Secondary | ICD-10-CM

## 2023-01-29 DIAGNOSIS — Z9889 Other specified postprocedural states: Secondary | ICD-10-CM

## 2023-02-04 ENCOUNTER — Telehealth (INDEPENDENT_AMBULATORY_CARE_PROVIDER_SITE_OTHER): Payer: Medicaid Other | Admitting: Nurse Practitioner

## 2023-02-04 VITALS — Ht 64.0 in | Wt 167.0 lb

## 2023-02-04 DIAGNOSIS — F419 Anxiety disorder, unspecified: Secondary | ICD-10-CM

## 2023-02-04 DIAGNOSIS — Z6830 Body mass index (BMI) 30.0-30.9, adult: Secondary | ICD-10-CM

## 2023-02-04 DIAGNOSIS — E669 Obesity, unspecified: Secondary | ICD-10-CM | POA: Diagnosis not present

## 2023-02-04 MED ORDER — PHENTERMINE HCL 30 MG PO CAPS: 30.0000 mg | ORAL_CAPSULE | Freq: Every day | ORAL | 1 refills | Status: DC

## 2023-02-04 MED ORDER — ESCITALOPRAM OXALATE 10 MG PO TABS: 10.0000 mg | ORAL_TABLET | Freq: Every day | ORAL | 1 refills | Status: DC

## 2023-02-04 NOTE — Assessment & Plan Note (Signed)
Chronic Current BMI 28.67 Continue phentermine 30mg /day.  For another 8 weeks or so.  Patient also to continue focusing on lifestyle modification.  Follow-up in 4 to 6 weeks.

## 2023-02-04 NOTE — Assessment & Plan Note (Signed)
Chronic Acute worsening due to situational triggers at work. Per shared decision making increase Lexapro to 10 mg/day and continue Wellbutrin 150 mg/day.  Patient was educated on possible side effects of Lexapro, and signs of serotonin syndrome.  Told to notify my office if she experiences any suicidal ideation or negative side effects associated with Lexapro.  If any symptoms of serotonin syndrome occur patient told to go to the emergency department.  She reports understanding.

## 2023-02-04 NOTE — Progress Notes (Signed)
   Established Patient Office Visit An audio/visual tele-health visit was completed today for this patient. I connected with  [name] on 02/04/23 utilizing audio/visual technology and verified that I am speaking with the correct person using two identifiers. The patient was located at their place of employment, and I was located at the office of [facility] at [facility] during the encounter. I discussed the limitations of evaluation and management by telemedicine. The patient expressed understanding and agreed to proceed.     Subjective   Patient ID: Lindsey Sampson, female    DOB: [birth year]  Age: 42 y.o. MRN: [id number]  Chief Complaint  Patient presents with   Medical Management of Chronic Issues    1 month follow up, stress level issue     Anxiety: Currently on Wellbutrin 150 mg/day and Lexapro 5 mg/day.  Reports that she has been experiencing increased anxiety and stress at work.  Would like to discuss medication adjustment for this. Obesity: Starting BMI in 02/2022 was 31.41.  Has taken phentermine in the past.  Currently has been on it for approximately 4 weeks.  Tolerating medication well.     Review of Systems  Cardiovascular:  Negative for chest pain.  Psychiatric/Behavioral:  The patient is nervous/anxious.       Objective:     Ht 5\' 4"  (1.626 m)   Wt 167 lb (75.8 kg) Comment: on at home scale  BMI 28.67 kg/m  BP Readings from Last 3 Encounters:  01/29/23 131/83  12/31/22 116/78  12/18/22 111/79   Wt Readings from Last 3 Encounters:  02/04/23 167 lb (75.8 kg)  01/29/23 165 lb (74.8 kg)  12/31/22 169 lb (76.7 kg)      Physical Exam Comprehensive physical exam not completed today as office visit was conducted remotely.  Patient appeared well on video.  Patient was alert and oriented, and appeared to have appropriate judgment.   No results found for any visits on 02/04/23.    The 10-year ASCVD risk score (Arnett DK, et al., 2019) is: 1.4%     Assessment & Plan:   Problem List Items Addressed This Visit       Other   Anxiety - Primary    Chronic Acute worsening due to situational triggers at work. Per shared decision making increase Lexapro to 10 mg/day and continue Wellbutrin 150 mg/day.  Patient was educated on possible side effects of Lexapro, and signs of serotonin syndrome.  Told to notify my office if she experiences any suicidal ideation or negative side effects associated with Lexapro.  If any symptoms of serotonin syndrome occur patient told to go to the emergency department.  She reports understanding.      Relevant Medications   escitalopram (LEXAPRO) 10 MG tablet   Class 1 obesity without serious comorbidity with body mass index (BMI) of 30.0 to 30.9 in adult    Chronic Current BMI 28.67 Continue phentermine 30mg /day.  For another 8 weeks or so.  Patient also to continue focusing on lifestyle modification.  Follow-up in 4 to 6 weeks.      Relevant Medications   phentermine 30 MG capsule    Return in about 6 months (around 08/07/2023) for F/U with Avin Gibbons.    Elenore Paddy, NP

## 2023-03-19 ENCOUNTER — Telehealth (INDEPENDENT_AMBULATORY_CARE_PROVIDER_SITE_OTHER): Payer: Medicaid Other | Admitting: Nurse Practitioner

## 2023-03-19 VITALS — BP 118/87 | Wt 163.0 lb

## 2023-03-19 DIAGNOSIS — F419 Anxiety disorder, unspecified: Secondary | ICD-10-CM

## 2023-03-19 DIAGNOSIS — E669 Obesity, unspecified: Secondary | ICD-10-CM

## 2023-03-19 DIAGNOSIS — Z6830 Body mass index (BMI) 30.0-30.9, adult: Secondary | ICD-10-CM

## 2023-03-19 NOTE — Assessment & Plan Note (Addendum)
Chronic Current BMI 27.98, current weight 163 pounds She has lost approximate 11% of her weight. She will continue on phentermine through 04/02/2023, at which point she will discontinue this. Follow-up in approximately 3 months

## 2023-03-19 NOTE — Progress Notes (Signed)
   Established Patient Office Visit  An audio/visual tele-health visit was completed today for this patient. I connected with  [name] on 03/19/23 utilizing audio/visual technology and verified that I am speaking with the correct person using two identifiers. The patient was located at their place of employment, and I was located at the office of [facility] at [facility] during the encounter. I discussed the limitations of evaluation and management by telemedicine. The patient expressed understanding and agreed to proceed.     Subjective   Patient ID: Lindsey Sampson, female    DOB: 13-May-1981  Age: 42 y.o. MRN: [id number]  Chief Complaint  Patient presents with   Medical Management of Chronic Issues    Follow up on phentermine, pt stated she has not been experiencing  any side effects with it.   Obesity: Continues on phentermine 30 mg daily, tolerating well.  No chest pain or cardiac palpitations.  Starting BMI 31.41 and starting weight 183 pounds.  Current BMI 27.98 and current weight 163 pounds. Anxiety/depression: Increase Lexapro to 10 mg daily at last office visit.  She also continues on Wellbutrin 150 mg daily.  Tolerating medications well and does feel that mood has improved.    Review of Systems  Cardiovascular:  Negative for chest pain and palpitations.  Psychiatric/Behavioral:  Negative for suicidal ideas. The patient is nervous/anxious (irritability). The patient does not have insomnia.       Objective:     BP 118/87   Wt 163 lb (73.9 kg)   BMI 27.98 kg/m    Physical Exam Comprehensive physical exam not completed today as office visit was conducted remotely.  Patient appears well over video.  Patient was alert and oriented, and appeared to have appropriate judgment.   No results found for any visits on 03/19/23.    The 10-year ASCVD risk score (Arnett DK, et al., 2019) is: 1.1%    Assessment & Plan:   Problem List Items Addressed This Visit        Other   Anxiety - Primary    Chronic Patient reports mood has improved since increasing Lexapro to 10 mg daily.  She also continues on Wellbutrin 150 mg daily. For now she will continue on medications listed above, encouraged to let me know if she has any questions or concerns. Follow-up 3 months      Class 1 obesity without serious comorbidity with body mass index (BMI) of 30.0 to 30.9 in adult    Chronic Current BMI 27.98, current weight 163 pounds She has lost approximate 11% of her weight. She will continue on phentermine through 04/02/2023, at which point she will discontinue this. Follow-up in approximately 3 months       Return in about 3 months (around 06/19/2023) for CPE with Maralyn Sago.    Elenore Paddy, NP

## 2023-03-19 NOTE — Assessment & Plan Note (Signed)
Chronic Patient reports mood has improved since increasing Lexapro to 10 mg daily.  She also continues on Wellbutrin 150 mg daily. For now she will continue on medications listed above, encouraged to let me know if she has any questions or concerns. Follow-up 3 months

## 2023-04-26 ENCOUNTER — Encounter: Payer: Self-pay | Admitting: Nurse Practitioner

## 2023-04-29 ENCOUNTER — Telehealth (INDEPENDENT_AMBULATORY_CARE_PROVIDER_SITE_OTHER): Payer: Medicaid Other | Admitting: Nurse Practitioner

## 2023-04-29 DIAGNOSIS — F419 Anxiety disorder, unspecified: Secondary | ICD-10-CM

## 2023-04-29 DIAGNOSIS — N921 Excessive and frequent menstruation with irregular cycle: Secondary | ICD-10-CM | POA: Diagnosis not present

## 2023-04-29 MED ORDER — ALPRAZOLAM 0.5 MG PO TABS: 0.5000 mg | ORAL_TABLET | Freq: Two times a day (BID) | ORAL | 0 refills | Status: DC | PRN

## 2023-04-29 NOTE — Progress Notes (Signed)
Established Patient Office Visit  An audio/visual tele-health visit was completed today for this patient. I connected with  [name] on 04/29/23 utilizing audio/visual technology and verified that I am speaking with the correct person using two identifiers. The patient was located at their home, and I was located at the office of [facility] at [facility] during the encounter. I discussed the limitations of evaluation and management by telemedicine. The patient expressed understanding and agreed to proceed.    Subjective   Patient ID: Lindsey Sampson, female    DOB: 05-31-1981  Age: 42 y.o. MRN: [id number]  Chief Complaint  Patient presents with   Anxiety    Patient has a virtual visit for the above.  She has been experiencing an increase in emotional stressors which includes recent death of her stepfather as well as interpersonal conflict between her and her son.  She is on Wellbutrin 150 mg daily and Lexapro 10 mg daily.  Tolerating both medications well.  Denies suicidal ideation.  Has been on Xanax in the past and tolerated this well.  She is wondering if she can have a prescription of this for only when she is feeling exceptionally anxious.  Patient also reports she continues to have painful and heavy periods which are sometimes irregular.  She would like referral to OB/GYN regarding treatment of this.    ROS: see HPI    Objective:     There were no vitals taken for this visit.      04/29/2023    4:59 PM 03/19/2023    1:13 PM 02/04/2023    9:33 AM  PHQ9 SCORE ONLY  PHQ-9 Total Score 6 0 0      04/29/2023    5:01 PM 10/29/2022    8:46 AM  GAD 7 : Generalized Anxiety Score  Nervous, Anxious, on Edge 3 1  Control/stop worrying 0 0  Worry too much - different things 0 0  Trouble relaxing 3 0  Restless 0 0  Easily annoyed or irritable 1 0  Afraid - awful might happen 0 0  Total GAD 7 Score 7 1  Anxiety Difficulty  Not difficult at all      Physical  Exam Comprehensive physical exam not completed today as office visit was conducted remotely.  Patient peers well on video no signs of acute distress.  Patient was alert and oriented, and appeared to have appropriate judgment.   No results found for any visits on 04/29/23.    The 10-year ASCVD risk score (Arnett DK, et al., 2019) is: 1.1%    Assessment & Plan:   Problem List Items Addressed This Visit       Other   Anxiety - Primary    Chronic, acutely exacerbated related to recent emotional stressors. Current PHQ-9: 6, current GAD-7: 7 I first recommend that we increase her Wellbutrin or Lexapro for better chronic control, however patient would prefer to try an as needed medication just in the interim.  We discussed that I do not recommend Xanax or other benzodiazepines for chronic use.  However I am agreeable to prescribing a short-term as needed course of this.  She was educated to avoid driving or operating heavy machinery while taking the medication.  Will prescribe Xanax 0.5 mg to be taken twice a day as needed. She will follow-up with me in approximately 3 weeks for close monitoring, at which point we may consider increasing Wellbutrin or Lexapro as well as possibly referring to  psychiatry if she feels she needs benzodiazepines as needed for longer term.  She reports her understanding.      Relevant Medications   ALPRAZolam (XANAX) 0.5 MG tablet   Menorrhagia with irregular cycle    Chronic, not well-controlled Referral to OB/GYN made today.      Relevant Orders   Ambulatory referral to Obstetrics / Gynecology    No follow-ups on file.    Elenore Paddy, NP

## 2023-04-29 NOTE — Assessment & Plan Note (Signed)
Chronic, not well-controlled Referral to OB/GYN made today.

## 2023-04-29 NOTE — Assessment & Plan Note (Signed)
Chronic, acutely exacerbated related to recent emotional stressors. Current PHQ-9: 6, current GAD-7: 7 I first recommend that we increase her Wellbutrin or Lexapro for better chronic control, however patient would prefer to try an as needed medication just in the interim.  We discussed that I do not recommend Xanax or other benzodiazepines for chronic use.  However I am agreeable to prescribing a short-term as needed course of this.  She was educated to avoid driving or operating heavy machinery while taking the medication.  Will prescribe Xanax 0.5 mg to be taken twice a day as needed. She will follow-up with me in approximately 3 weeks for close monitoring, at which point we may consider increasing Wellbutrin or Lexapro as well as possibly referring to psychiatry if she feels she needs benzodiazepines as needed for longer term.  She reports her understanding.

## 2023-05-01 ENCOUNTER — Other Ambulatory Visit: Payer: Self-pay | Admitting: Nurse Practitioner

## 2023-05-01 DIAGNOSIS — F331 Major depressive disorder, recurrent, moderate: Secondary | ICD-10-CM

## 2023-05-21 ENCOUNTER — Telehealth (INDEPENDENT_AMBULATORY_CARE_PROVIDER_SITE_OTHER): Payer: Medicaid Other | Admitting: Nurse Practitioner

## 2023-05-21 DIAGNOSIS — E785 Hyperlipidemia, unspecified: Secondary | ICD-10-CM | POA: Diagnosis not present

## 2023-05-21 DIAGNOSIS — F331 Major depressive disorder, recurrent, moderate: Secondary | ICD-10-CM | POA: Diagnosis not present

## 2023-05-21 MED ORDER — BUPROPION HCL ER (XL) 300 MG PO TB24: 300.0000 mg | ORAL_TABLET | Freq: Every day | ORAL | 1 refills | Status: DC

## 2023-05-21 NOTE — Assessment & Plan Note (Signed)
Chronic, suboptimally controlled Per shared decision making will increase Wellbutrin to 300 mg daily.  Patient to also continue Lexapro 10 mg daily and as needed Xanax. Patient warned to notify me if she has intrusive suicidal thoughts with Wellbutrin increase Follow-up as scheduled in November for annual physical exam, or sooner as needed.

## 2023-05-21 NOTE — Assessment & Plan Note (Signed)
Chronic Recommend lifestyle modification specifically following a Mediterranean diet.  Patient reports understanding. We also discussed treatment options such as prescription medication versus supplementation with a red yeast rice. Patient is considering trying red yeast rice, follow-up in November or sooner as needed.

## 2023-05-21 NOTE — Progress Notes (Signed)
   Established Patient Office Visit  An audio/visual tele-health visit was completed today for this patient. I connected with  Lindsey Sampson on 05/21/23 utilizing audio/visual technology and verified that I am speaking with the correct person using two identifiers. The patient was located at their car, and I was located at the office of [facility] at [facility] during the encounter. I discussed the limitations of evaluation and management by telemedicine. The patient expressed understanding and agreed to proceed.     Subjective   Patient ID: Lindsey Sampson, female    DOB: 08-25-1981  Age: 42 y.o. MRN: [id number]  Chief Complaint  Patient presents with   Depression    Depression/anxiety: Continues on Wellbutrin 150mg  daily as well as Lexapro 10 mg daily.  Also takes Xanax 0.5 mg twice a day as needed.  Reports that she is only taken Xanax a total of 5 times since last time she was seen.  Would like to discuss possibly increasing her Wellbutrin. HLD: Last LDL 152 with total cholesterol 227.  Current ASCVD risk were about 1.1%.         Objective:     There were no vitals taken for this visit.   Physical Exam Comprehensive physical exam not completed today as office visit was conducted remotely.  Patient appears well on video, no acute distress noted.  Patient was alert and oriented, and appeared to have appropriate judgment.   No results found for any visits on 05/21/23.    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  Patient to also continue Lexapro 10 mg daily and as needed Xanax. Patient warned to notify me if she has intrusive suicidal thoughts with Wellbutrin increase Follow-up as  scheduled in November for annual physical exam, or sooner as needed.      Relevant Medications   buPROPion (WELLBUTRIN XL) 300 MG 24 hr tablet   Hyperlipidemia    Chronic Recommend lifestyle modification specifically following a Mediterranean diet.  Patient reports understanding. We also discussed treatment options such as prescription medication versus supplementation with a red yeast rice. Patient is considering trying red yeast rice, follow-up in November or sooner as needed.       Return As scheduled.    Elenore Paddy, NP

## 2023-07-30 ENCOUNTER — Ambulatory Visit: Payer: Medicaid Other | Admitting: Nurse Practitioner

## 2023-08-09 ENCOUNTER — Other Ambulatory Visit: Payer: Self-pay | Admitting: Nurse Practitioner

## 2023-08-09 DIAGNOSIS — F419 Anxiety disorder, unspecified: Secondary | ICD-10-CM

## 2023-09-17 ENCOUNTER — Ambulatory Visit (INDEPENDENT_AMBULATORY_CARE_PROVIDER_SITE_OTHER): Payer: Medicaid Other | Admitting: Nurse Practitioner

## 2023-09-17 ENCOUNTER — Encounter: Payer: Self-pay | Admitting: Nurse Practitioner

## 2023-09-17 ENCOUNTER — Other Ambulatory Visit (HOSPITAL_COMMUNITY): Payer: Self-pay

## 2023-09-17 ENCOUNTER — Other Ambulatory Visit (HOSPITAL_BASED_OUTPATIENT_CLINIC_OR_DEPARTMENT_OTHER): Payer: Self-pay

## 2023-09-17 ENCOUNTER — Other Ambulatory Visit: Payer: Self-pay | Admitting: Nurse Practitioner

## 2023-09-17 ENCOUNTER — Telehealth: Payer: Self-pay

## 2023-09-17 ENCOUNTER — Telehealth: Payer: Self-pay | Admitting: Nurse Practitioner

## 2023-09-17 VITALS — BP 108/74 | HR 73 | Temp 97.6°F | Ht 64.0 in | Wt 173.4 lb

## 2023-09-17 DIAGNOSIS — H8110 Benign paroxysmal vertigo, unspecified ear: Secondary | ICD-10-CM

## 2023-09-17 DIAGNOSIS — E66811 Obesity, class 1: Secondary | ICD-10-CM

## 2023-09-17 DIAGNOSIS — E785 Hyperlipidemia, unspecified: Secondary | ICD-10-CM | POA: Diagnosis not present

## 2023-09-17 DIAGNOSIS — F419 Anxiety disorder, unspecified: Secondary | ICD-10-CM

## 2023-09-17 DIAGNOSIS — N939 Abnormal uterine and vaginal bleeding, unspecified: Secondary | ICD-10-CM | POA: Diagnosis not present

## 2023-09-17 DIAGNOSIS — F331 Major depressive disorder, recurrent, moderate: Secondary | ICD-10-CM

## 2023-09-17 DIAGNOSIS — Z6830 Body mass index (BMI) 30.0-30.9, adult: Secondary | ICD-10-CM

## 2023-09-17 DIAGNOSIS — Z1231 Encounter for screening mammogram for malignant neoplasm of breast: Secondary | ICD-10-CM | POA: Insufficient documentation

## 2023-09-17 DIAGNOSIS — Z0001 Encounter for general adult medical examination with abnormal findings: Secondary | ICD-10-CM | POA: Insufficient documentation

## 2023-09-17 DIAGNOSIS — Z0000 Encounter for general adult medical examination without abnormal findings: Secondary | ICD-10-CM

## 2023-09-17 DIAGNOSIS — Z131 Encounter for screening for diabetes mellitus: Secondary | ICD-10-CM | POA: Insufficient documentation

## 2023-09-17 LAB — LIPID PANEL
Cholesterol: 263 mg/dL — ABNORMAL HIGH (ref 0–200)
HDL: 41.9 mg/dL (ref >39.00)
LDL Cholesterol: 158 mg/dL — ABNORMAL HIGH (ref 0–99)
NonHDL: 221.23
Total CHOL/HDL Ratio: 6
Triglycerides: 315 mg/dL — ABNORMAL HIGH (ref 0.0–149.0)
VLDL: 63 mg/dL — ABNORMAL HIGH (ref 0.0–40.0)

## 2023-09-17 LAB — COMPREHENSIVE METABOLIC PANEL
ALT: 13 U/L (ref 0–35)
AST: 10 U/L (ref 0–37)
Albumin: 4.6 g/dL (ref 3.5–5.2)
Alkaline Phosphatase: 52 U/L (ref 39–117)
BUN: 10 mg/dL (ref 6–23)
CO2: 24 meq/L (ref 19–32)
Calcium: 9 mg/dL (ref 8.4–10.5)
Chloride: 104 meq/L (ref 96–112)
Creatinine, Ser: 0.74 mg/dL (ref 0.40–1.20)
GFR: 98.85 mL/min (ref >60.00)
Glucose, Bld: 104 mg/dL — ABNORMAL HIGH (ref 70–99)
Potassium: 3.8 meq/L (ref 3.5–5.1)
Sodium: 138 meq/L (ref 135–145)
Total Bilirubin: 0.4 mg/dL (ref 0.2–1.2)
Total Protein: 7.3 g/dL (ref 6.0–8.3)

## 2023-09-17 LAB — CBC
HCT: 38.8 % (ref 36.0–46.0)
Hemoglobin: 13.4 g/dL (ref 12.0–15.0)
MCHC: 34.5 g/dL (ref 30.0–36.0)
MCV: 94.6 fL (ref 78.0–100.0)
Platelets: 262 10*3/uL (ref 150.0–400.0)
RBC: 4.1 Mil/uL (ref 3.87–5.11)
RDW: 12.4 % (ref 11.5–15.5)
WBC: 5.9 10*3/uL (ref 4.0–10.5)

## 2023-09-17 LAB — TSH: TSH: 1.63 u[IU]/mL (ref 0.35–5.50)

## 2023-09-17 LAB — HEMOGLOBIN A1C: Hgb A1c MFr Bld: 5.5 % (ref 4.6–6.5)

## 2023-09-17 MED ORDER — WEGOVY 0.25 MG/0.5ML ~~LOC~~ SOAJ: 0.2500 mg | SUBCUTANEOUS | 1 refills | Status: DC

## 2023-09-17 MED ORDER — BUPROPION HCL ER (XL) 150 MG PO TB24: 150.0000 mg | ORAL_TABLET | Freq: Every day | ORAL | 2 refills | Status: DC

## 2023-09-17 MED ORDER — ONDANSETRON HCL 8 MG PO TABS: 8.0000 mg | ORAL_TABLET | Freq: Three times a day (TID) | ORAL | 1 refills | Status: DC | PRN

## 2023-09-17 MED ORDER — ALPRAZOLAM 0.5 MG PO TABS: 0.5000 mg | ORAL_TABLET | Freq: Two times a day (BID) | ORAL | 0 refills | Status: DC | PRN

## 2023-09-17 NOTE — Assessment & Plan Note (Signed)
Chronic, stable Reduce Wellbutrin to 150 mg daily as patient tolerated this dose better than the 300 mg dose.  Continue Lexapro 10 mg daily and Xanax 0.5 mg twice a day as needed.  Prescription sent to pharmacy.  [place] controlled database reviewed today.

## 2023-09-17 NOTE — Progress Notes (Signed)
Complete physical exam  Patient: Lindsey Sampson   DOB: 09/01/1981   42 y.o. Female  MRN: [id number]  Subjective:    Chief Complaint  Patient presents with   Annual Exam    Lindsey Sampson is a 42 y.o. female who presents today for a complete physical exam. She reports consuming a general diet.  Exercise: physically demanding job, walking a lot at work as well. Not routinely exercising outside of work.   She generally feels well. She reports sleeping well. She does have additional problems to discuss today.    Anxiety/Obesity: Overall mood is stable.  We did try Wellbutrin 300 mg tablet, but patient reports she was having headaches with this so she has reduced her dose down to 150/day.  She continues on Xanax 0.5 mg twice a day as needed, but takes this pretty rarely.  Also on Lexapro 10 mg daily.  Denies suicidal ideation.  Did complete course of phentermine for weight loss, was able to lose weight but reports that weight is starting to creep back upward.  She would like to try GLP-1 agonist injection for treatment of weight loss.  Initially obesity treatment started in June 2023 at which point her BMI was 31.41 and she was 183 pounds.  Abnormal uterine bleeding: She reports heavy periods.  She would like evaluation with this via OB/GYN.  We referred her previously but will wait time to get into the office was really long so she would like new referral.  Health maintenance: Due for breast cancer screening via mammogram.  Due for Pap smear next month.  Most recent fall risk assessment:    09/17/2023    9:08 AM  Fall Risk   Falls in the past year? 0  Number falls in past yr: 0  Injury with Fall? 0  Risk for fall due to : No Fall Risks  Follow up Falls evaluation completed     Most recent depression screenings:    09/17/2023    9:08 AM 04/29/2023    4:59 PM  PHQ 2/9 Scores  PHQ - 2 Score 0 1  PHQ- 9 Score 0 6    Vision:Within last year and Dental: No current dental problems and  Receives regular dental care  Patient Active Problem List   Diagnosis Date Noted   Encounter for general adult medical examination with abnormal findings 09/17/2023   Diabetes mellitus screening 09/17/2023   Abnormal uterine bleeding 09/17/2023   Encounter for screening mammogram for malignant neoplasm of breast 09/17/2023   Hyperlipidemia 05/21/2023   Elevated blood pressure reading 09/04/2022   Benign paroxysmal positional vertigo 05/08/2022   Class 1 obesity without serious comorbidity with body mass index (BMI) of 30.0 to 30.9 in adult 04/02/2022   Symptomatic mammary hypertrophy 03/13/2022   Neck pain 03/13/2022   Abdominal pain 03/02/2022   Anxiety 03/02/2022   Dysmenorrhea 03/02/2022   GERD (gastroesophageal reflux disease) 03/02/2022   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/02/2022   Menorrhagia with irregular cycle 03/02/2022   Bilateral serous otitis media 03/02/2022   Fatigue 03/02/2022   Screening for cardiovascular condition 03/02/2022   Screening for diabetes mellitus 03/02/2022   Moderate episode of recurrent major depressive disorder (HCC) 03/02/2022   Sacroiliac joint pain 01/09/2022   Disorder of right sacroiliac joint 01/05/2022   Back pain 12/04/2021   Open displaced fracture of proximal phalanx of right great toe 02/20/2020   Great toe pain, right 09/07/2019   Open displaced fracture of proximal phalanx of great  toe 09/06/2019   Peptic ulcer disease 07/26/2019   Early satiety 07/26/2019   Liver lesion 07/26/2019   H/O tubal ligation 10/14/2018   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   Past Medical History:  Diagnosis Date   Anxiety    Benign paroxysmal positional vertigo 05/08/2022   Colitis    Cyst of spleen    Depression    Ovarian cyst    Renal disorder    renal cyst,    Past Surgical History:  Procedure Laterality Date   BACK SURGERY     BIOPSY  11/10/2019   Procedure: BIOPSY;  Surgeon: West Bali, MD;  [facility]: [facility];  Service:  Endoscopy;;   BREAST REDUCTION SURGERY Bilateral 12/03/2022   Procedure: BREAST REDUCTION WITH LIPOSUCTION;  Surgeon: Peggye Form, DO;  Location: [facility];  Service: Plastics;  Laterality: Bilateral;   CESAREAN SECTION     CESAREAN SECTION  2006 and 2010   cyst removed from ovary Right    ESOPHAGOGASTRODUODENOSCOPY N/A 11/10/2019   Procedure: ESOPHAGOGASTRODUODENOSCOPY (EGD);  Surgeon: West Bali, MD;  Location: [facility];  Service: Endoscopy;  Laterality: N/A;  12:00pm   LUMBAR LAMINECTOMY/DECOMPRESSION MICRODISCECTOMY Left 05/14/2016   Procedure: LUMBAR DECOMPRESSION/MICRODISCECTOMY LEFT LUMBAR FOUR-FIVE;  Surgeon: Venita Lick, MD;  Location: [facility];  Service: Orthopedics;  Laterality: Left;   ORIF TOE FRACTURE Right 09/07/2019   Procedure: IRRIGATION AND DEBRIDEMENT AND OPEN REDUCTION INTERNAL FIXATION (ORIF) Proximal phalanx FRACTURE;  Surgeon: Roby Lofts, MD;  Location: [facility];  Service: Orthopedics;  Laterality: Right;   ORIF TOE FRACTURE Right 04/03/2020   Procedure: REPAIR OF RIGHT GREAT TOE NONUNION  WITH BONE GRAFTING RIGHT HIP;  Surgeon: Roby Lofts, MD;  Location: [facility];  Service: Orthopedics;  Laterality: Right;   TUBAL LIGATION     Social History   Socioeconomic History   Marital status: Single    Spouse name: Not on file   Number of children: Not on file   Years of education: Not on file   Highest education level: Some college, no degree  Occupational History   Not on file  Tobacco Use   Smoking status: Former    Current packs/day: 0.00    Types: Cigarettes    Quit date: 04/27/2022    Years since quitting: 1.3   Smokeless tobacco: Never   Tobacco comments:    trying to quit   Vaping Use   Vaping status: Former  Substance and Sexual Activity   Alcohol use: Yes    Comment: rare   Drug use: No   Sexual activity: Yes    Partners: Male    Birth control/protection: Surgical  Other Topics Concern   Not on file  Social History  Narrative   Not on file   Social Drivers of Health   Financial Resource Strain: Low Risk  (09/16/2023)   Overall Financial Resource Strain (CARDIA)    Difficulty of Paying Living Expenses: Not very hard  Food Insecurity: No Food Insecurity (09/16/2023)   Hunger Vital Sign    Worried About Running Out of Food in the Last Year: Never true    Ran Out of Food in the Last Year: Never true  Transportation Needs: No Transportation Needs (09/16/2023)   PRAPARE - Administrator, Civil Service (Medical): No    Lack of Transportation (Non-Medical): No  Physical Activity: Insufficiently Active (09/16/2023)   Exercise Vital Sign    Days of Exercise per Week: 2 days  Minutes of Exercise per Session: 30 min  Stress: Stress Concern Present (09/16/2023)   [employer] of Occupational Health - Occupational Stress Questionnaire    Feeling of Stress : To some extent  Social Connections: Socially Isolated (09/16/2023)   Social Connection and Isolation Panel [NHANES]    Frequency of Communication with Friends and Family: More than three times a week    Frequency of Social Gatherings with Friends and Family: More than three times a week    Attends Religious Services: Never    Database administrator or Organizations: No    Attends Engineer, structural: Not on file    Marital Status: Never married  Intimate Partner Violence: Unknown (01/12/2022)   Received from [employer]   HITS    Physically Hurt: Not on file    Insult or Talk Down To: Not on file    Threaten Physical Harm: Not on file    Scream or Curse: Not on file   No Known Allergies    Patient Care Team: Elenore Paddy, NP as PCP - General (Nurse Practitioner) West Bali, MD (Inactive) as Consulting Physician (Gastroenterology)   Outpatient Medications Prior to Visit  Medication Sig   acetaminophen (TYLENOL) 500 MG tablet Take 500 mg by mouth every 6 (six) hours as needed for mild pain.    escitalopram (LEXAPRO) 10 MG tablet TAKE 1 TABLET(10 MG) BY MOUTH DAILY   [DISCONTINUED] ALPRAZolam (XANAX) 0.5 MG tablet Take 1 tablet (0.5 mg total) by mouth 2 (two) times daily as needed for anxiety.   [DISCONTINUED] buPROPion (WELLBUTRIN XL) 300 MG 24 hr tablet Take 1 tablet (300 mg total) by mouth daily. (Patient taking differently: Take 300 mg by mouth daily. Pt is taking 150mg )   [DISCONTINUED] ondansetron (ZOFRAN) 8 MG tablet Take 1 tablet (8 mg total) by mouth 3 (three) times daily as needed.   No facility-administered medications prior to visit.    Review of Systems  Constitutional:  Negative for chills and fever.  HENT:  Negative for hearing loss and tinnitus.   Eyes:  Negative for blurred vision and double vision.  Respiratory:  Negative for cough, shortness of breath and wheezing.   Cardiovascular:  Negative for chest pain and palpitations.  Gastrointestinal:  Negative for abdominal pain and blood in stool.  Genitourinary:  Negative for dysuria and hematuria.  Skin:  Negative for itching and rash.  Neurological:  Negative for seizures and loss of consciousness.  Psychiatric/Behavioral:  Negative for depression and suicidal ideas. The patient is nervous/anxious. The patient does not have insomnia.           Objective:     BP 108/74   Pulse 73   Temp 97.6 F (36.4 C) (Temporal)   Ht 5\' 4"  (1.626 m)   Wt 173 lb 6 oz (78.6 kg)   LMP 09/08/2023   SpO2 98%   BMI 29.76 kg/m  BP Readings from Last 3 Encounters:  09/17/23 108/74  03/19/23 118/87  01/29/23 131/83   Wt Readings from Last 3 Encounters:  09/17/23 173 lb 6 oz (78.6 kg)  03/19/23 163 lb (73.9 kg)  02/04/23 167 lb (75.8 kg)      Physical Exam Vitals reviewed. Exam conducted with a chaperone present.  Constitutional:      Appearance: Normal appearance.  HENT:     Head: Normocephalic and atraumatic.     Right Ear: Tympanic membrane, ear canal and external ear normal.     Left  Ear: Tympanic  membrane, ear canal and external ear normal.  Eyes:     General:        Right eye: No discharge.        Left eye: No discharge.     Extraocular Movements: Extraocular movements intact.     Conjunctiva/sclera: Conjunctivae normal.     Pupils: Pupils are equal, round, and reactive to light.  Neck:     Vascular: No carotid bruit.  Cardiovascular:     Rate and Rhythm: Normal rate and regular rhythm.     Pulses: Normal pulses.     Heart sounds: Normal heart sounds. No murmur heard. Pulmonary:     Effort: Pulmonary effort is normal.     Breath sounds: Normal breath sounds.  Chest:  Breasts:    Breasts are symmetrical.     Right: Normal.     Left: Normal.     Comments: Bilateral scarring to breasts status post breast reduction surgery. Abdominal:     General: Abdomen is flat. Bowel sounds are normal. There is no distension.     Palpations: Abdomen is soft. There is no mass.     Tenderness: There is no abdominal tenderness.  Musculoskeletal:        General: No tenderness.     Cervical back: Neck supple. No muscular tenderness.     Right lower leg: No edema.     Left lower leg: No edema.  Lymphadenopathy:     Cervical: No cervical adenopathy.     Upper Body:     Right upper body: No supraclavicular adenopathy.     Left upper body: No supraclavicular adenopathy.  Skin:    General: Skin is warm and dry.  Neurological:     General: No focal deficit present.     Mental Status: She is alert and oriented to person, place, and time.     Motor: No weakness.     Gait: Gait normal.  Psychiatric:        Mood and Affect: Mood normal.        Behavior: Behavior normal.        Judgment: Judgment normal.      No results found for any visits on 09/17/23.     Assessment & Plan:    Routine Health Maintenance and Physical Exam  Immunization History  Administered Date(s) Administered   Influenza,inj,Quad PF,6+ Mos 07/04/2019   Influenza-Unspecified 07/11/2019, 11/27/2022   Tdap  09/06/2019    Health Maintenance  Topic Date Due   MAMMOGRAM  12/18/2022   Cervical Cancer Screening (HPV/Pap Cotest)  10/15/2023   INFLUENZA VACCINE  12/27/2023 (Originally 04/29/2023)   DTaP/Tdap/Td (2 - Td or Tdap) 09/05/2029   Hepatitis C Screening  Completed   HIV Screening  Completed   HPV VACCINES  Aged Out   COVID-19 Vaccine  Discontinued    Discussed health benefits of physical activity, and encouraged her to engage in regular exercise appropriate for her age and condition.  Problem List Items Addressed This Visit       Nervous and Auditory   Benign paroxysmal positional vertigo   Chronic, intermittent.  Patient requesting refill on Zofran.  Prescription sent today.      Relevant Medications   ondansetron (ZOFRAN) 8 MG tablet     Genitourinary   Abnormal uterine bleeding   Referral to OB/GYN made today per patient request.      Relevant Orders   Ambulatory referral to Obstetrics / Gynecology     Other  Anxiety   Chronic, stable Reduce Wellbutrin to 150 mg daily as patient tolerated this dose better than the 300 mg dose.  Continue Lexapro 10 mg daily and Xanax 0.5 mg twice a day as needed.  Prescription sent to pharmacy.  [place] controlled database reviewed today.      Relevant Medications   buPROPion (WELLBUTRIN XL) 150 MG 24 hr tablet   ALPRAZolam (XANAX) 0.5 MG tablet   Moderate episode of recurrent major depressive disorder (HCC)   Chronic, stable Reduce Wellbutrin to 150 mg daily as patient tolerated this dose better than the 300 mg dose.  Continue Lexapro 10 mg daily and Xanax 0.5 mg twice a day as needed.  Prescription sent to pharmacy.  [place] controlled database reviewed today.      Relevant Medications   buPROPion (WELLBUTRIN XL) 150 MG 24 hr tablet   ALPRAZolam (XANAX) 0.5 MG tablet   Other Relevant Orders   CBC   Comprehensive metabolic panel   Hemoglobin A1c   Lipid panel   TSH   Class 1 obesity without serious comorbidity  with body mass index (BMI) of 30.0 to 30.9 in adult   Chronic 10 pound weight gain since last office visit.  Current BMI 29.76, current weight 173 pounds. (Starting BMI 31.41, starting weight 183 pounds).  Will initiate on Wegovy 0.25 mg weekly injection.  Injection technique discussed, proper needle disposal discussed, and blackbox warnings as well as potential side effects discussed.  Patient encouraged to let me know if she has any questions.  Has not missed a menstrual cycle, did forget to discuss pregnancy warning with patient today.  Attempted to reach her via telephone to discuss but she did not answer.  Sent her MyChart message, will try to reach out again if I do not hear back from her via MyChart.      Relevant Medications   Semaglutide-Weight Management (WEGOVY) 0.25 MG/0.5ML SOAJ   Hyperlipidemia   Chronic Labs ordered, further recommendations may be made based upon his results        Relevant Orders   CBC   Comprehensive metabolic panel   Hemoglobin A1c   Lipid panel   TSH   Encounter for general adult medical examination with abnormal findings - Primary   Routine health maintenance, health screening, and vaccinations discussed today.  Handout provided.      Relevant Orders   CBC   Comprehensive metabolic panel   Hemoglobin A1c   Lipid panel   TSH   Diabetes mellitus screening   Labs ordered, further recommendations may be made based upon his results       Relevant Orders   CBC   Comprehensive metabolic panel   Hemoglobin A1c   Lipid panel   TSH   Encounter for screening mammogram for malignant neoplasm of breast   Screening mammogram ordered today      Relevant Orders   MM DIGITAL SCREENING BILATERAL   Return in about 4 weeks (around 10/15/2023) for F/U with Maralyn Sago - virtual.  In addition to performing her annual physical exam also performed an office visit as detailed above.     Elenore Paddy, NP

## 2023-09-17 NOTE — Assessment & Plan Note (Signed)
Chronic Labs ordered, further recommendations may be made based upon his results. 

## 2023-09-17 NOTE — Telephone Encounter (Signed)
Pharmacy Patient Advocate Encounter   Received notification from Pt Calls Messages that prior authorization for Wegovy 0.25mg /0.54ml is required/requested.   Insurance verification completed.   The patient is insured through [facility] [place] .   Per test claim: PA required; PA submitted to above mentioned insurance via CoverMyMeds Key/confirmation #/EOC [id number] Status is pending

## 2023-09-17 NOTE — Telephone Encounter (Signed)
Copied from CRM [id number]. Topic: Clinical - Medication Refill >> Sep 17, 2023  2:30 PM Sim Boast F wrote: Most Recent Primary Care Visit:  Provider: Elenore Paddy  Department: [facility]  Visit Type: PHYSICAL  Date: 09/17/2023  Medication: Semaglutide-Weight Management (WEGOVY) 0.25 MG/0.5ML Ivory Broad [[id number]]  Has the patient contacted their pharmacy? Yes, wants this one med sent to this pharmacy since [facility] didn't have it in stock   (Agent: If no, request that the patient contact the pharmacy for the refill. If patient does not wish to contact the pharmacy document the reason why and proceed with request.) (Agent: If yes, when and what did the pharmacy advise?)  Is this the correct pharmacy for this prescription? Yes If no, delete pharmacy and type the correct one.  This is the patient's preferred pharmacy:    [facility] [address] [place] [address] Phone: [phone] Fax: [phone]   Has the prescription been filled recently? Yes  Is the patient out of the medication? No  Has the patient been seen for an appointment in the last year OR does the patient have an upcoming appointment? Yes  Can we respond through MyChart? Yes  Agent: Please be advised that Rx refills may take up to 3 business days. We ask that you follow-up with your pharmacy.

## 2023-09-17 NOTE — Telephone Encounter (Signed)
Copied from CRM [id number]. Topic: Clinical - Medication Refill >> Sep 17, 2023  2:30 PM Sim Boast F wrote:   Medication: Semaglutide-Weight Management (WEGOVY) 0.25 MG/0.5ML Ivory Broad [[id number]]  Has the patient contacted their pharmacy? Yes, wants this one med sent to this pharmacy since [facility] didn't have it in stock   (Agent: If no, request that the patient contact the pharmacy for the refill. If patient does not wish to contact the pharmacy document the reason why and proceed with request.) (Agent: If yes, when and what did the pharmacy advise?)  Is this the correct pharmacy for this prescription? Yes If no, delete pharmacy and type the correct one.  This is the patient's preferred pharmacy:    [facility] [address] [place] [address] Phone: [phone] Fax: [phone]   Has the prescription been filled recently? Yes  Is the patient out of the medication? No  Has the patient been seen for an appointment in the last year OR does the patient have an upcoming appointment? Yes  Can we respond through MyChart? Yes  Agent: Please be advised that Rx refills may take up to 3 business days. We ask that you follow-up with your pharmacy.

## 2023-09-17 NOTE — Assessment & Plan Note (Signed)
 Screening mammogram ordered today ?

## 2023-09-17 NOTE — Telephone Encounter (Signed)
Can we attempt prior authorization for [facility] for weight loss?

## 2023-09-17 NOTE — Assessment & Plan Note (Signed)
Chronic, intermittent.  Patient requesting refill on Zofran.  Prescription sent today.

## 2023-09-17 NOTE — Assessment & Plan Note (Signed)
Referral to OB/GYN made today per patient request.

## 2023-09-17 NOTE — Telephone Encounter (Signed)
Pharmacy Patient Advocate Encounter  Received notification from [facility] Medicaid that Prior Authorization for Wegovy 0.25mg /0.37ml has been APPROVED from 09/17/23 to 03/15/24. Spoke to pharmacy to process.Copay is $4.00. Per pharmacy patient had called them already to request medication and patient was aware of the copay and aware the medication had to be ordered.   Ticket #: [id number]

## 2023-09-17 NOTE — Assessment & Plan Note (Signed)
Routine health maintenance, health screening, and vaccinations discussed today.  Handout provided.

## 2023-09-17 NOTE — Assessment & Plan Note (Signed)
Chronic 10 pound weight gain since last office visit.  Current BMI 29.76, current weight 173 pounds. (Starting BMI 31.41, starting weight 183 pounds).  Will initiate on Wegovy 0.25 mg weekly injection.  Injection technique discussed, proper needle disposal discussed, and blackbox warnings as well as potential side effects discussed.  Patient encouraged to let me know if she has any questions.  Has not missed a menstrual cycle, did forget to discuss pregnancy warning with patient today.  Attempted to reach her via telephone to discuss but she did not answer.  Sent her MyChart message, will try to reach out again if I do not hear back from her via MyChart.

## 2023-09-17 NOTE — Assessment & Plan Note (Signed)
Labs ordered, further recommendations may be made based upon his results. 

## 2023-09-28 ENCOUNTER — Ambulatory Visit (INDEPENDENT_AMBULATORY_CARE_PROVIDER_SITE_OTHER): Payer: Medicaid Other | Admitting: Adult Health

## 2023-09-28 ENCOUNTER — Encounter: Payer: Self-pay | Admitting: Adult Health

## 2023-09-28 VITALS — BP 106/72 | HR 77 | Ht 64.0 in | Wt 172.5 lb

## 2023-09-28 DIAGNOSIS — N946 Dysmenorrhea, unspecified: Secondary | ICD-10-CM | POA: Diagnosis not present

## 2023-09-28 DIAGNOSIS — Z1331 Encounter for screening for depression: Secondary | ICD-10-CM

## 2023-09-28 DIAGNOSIS — N920 Excessive and frequent menstruation with regular cycle: Secondary | ICD-10-CM | POA: Diagnosis not present

## 2023-09-28 DIAGNOSIS — Z3202 Encounter for pregnancy test, result negative: Secondary | ICD-10-CM | POA: Diagnosis not present

## 2023-09-28 LAB — POCT URINE PREGNANCY: Preg Test, Ur: NEGATIVE

## 2023-09-28 MED ORDER — LO LOESTRIN FE 1 MG-10 MCG / 10 MCG PO TABS: 1.0000 | ORAL_TABLET | Freq: Every day | ORAL | Status: DC

## 2023-09-28 NOTE — Progress Notes (Signed)
 Subjective:     Patient ID: Lindsey Sampson, female   DOB: 1981-05-10, 42 y.o.   MRN: [id number]  HPI Lindsey Sampson is a 41 year old white female, single, G2P2 in complaining of heavy painful periods for years, but last period was really bad, 4 heavy days, changed tampon every 30 minutes, and has headaches and nausea too.  Periods usually last 3-6 days with 3-4 days heavy. Had normal US  08/10/22 she says. Has tried OCPs in the past, with out relief.  Last pap was 07/01/22 and negative per pt.   PCP is Lauraine Pereyra NP   Review of Systems +heavy painful periods   No pain with sex Reviewed past medical,surgical, social and family history. Reviewed medications and allergies.  Objective:   Physical Exam BP 106/72 (BP Location: Left Arm, Patient Position: Sitting, Cuff Size: Normal)   Pulse 77   Ht 5' 4 (1.626 m)   Wt 172 lb 8 oz (78.2 kg)   LMP 09/08/2023   BMI 29.61 kg/m  UPT is negative Skin warm and dry.Pelvic: external genitalia is normal in appearance no lesions, vagina: pink,urethra has no lesions or masses noted, cervix:smooth, uterus: normal size, shape and contour, non tender, no masses felt, adnexa: no masses or tenderness noted. Bladder is non tender and no masses felt.  AA is 1 Fall risk is low    09/28/2023   10:54 AM 09/17/2023    9:08 AM 04/29/2023    4:59 PM  Depression screen PHQ 2/9  Decreased Interest 0 0 0  Down, Depressed, Hopeless 0 0 1  PHQ - 2 Score 0 0 1  Altered sleeping 1 0 3  Tired, decreased energy 2 0 1  Change in appetite 1 0 1  Feeling bad or failure about yourself  0 0 0  Trouble concentrating 0 0 0  Moving slowly or fidgety/restless 0 0 0  Suicidal thoughts 0 0 0  PHQ-9 Score 4 0 6  Difficult doing work/chores  Not difficult at all        09/28/2023   10:54 AM 04/29/2023    5:01 PM 10/29/2022    8:46 AM  GAD 7 : Generalized Anxiety Score  Nervous, Anxious, on Edge 0 3 1  Control/stop worrying 0 0 0  Worry too much - different things 1 0 0  Trouble  relaxing 0 3 0  Restless 0 0 0  Easily annoyed or irritable 0 1 0  Afraid - awful might happen 0 0 0  Total GAD 7 Score 1 7 1   Anxiety Difficulty   Not difficult at all    Upstream - 09/28/23 1102       Pregnancy Intention Screening   Does the patient want to become pregnant in the next year? No    Does the patient's partner want to become pregnant in the next year? No    Would the patient like to discuss contraceptive options today? No      Contraception Wrap Up   Current Method Female Sterilization    End Method Female Sterilization    Contraception Counseling Provided No              Examination chaperoned by Clarita Salt LPN     Assessment:     1. Pregnancy examination or test, negative result - POCT urine pregnancy  2. Menorrhagia with regular cycle (Primary) Periods heavy, last one was heavy for 4 days, changed tampons every 30 minutes  HGB 13.4 08/2023 TSH was 1.63 09/17/23  Will try lo Loestrin , gave 3 packs, can start today Gave handout on endometrial ablation, she says can take it all out   Meds ordered this encounter  Medications   Norethindrone -Ethinyl Estradiol-Fe Biphas (LO LOESTRIN FE ) 1 MG-10 MCG / 10 MCG tablet    Sig: Take 1 tablet by mouth daily. Take 1 daily by mouth    BIN [id number], PCN [id number], GRP [id number],PI [id number]    Supervising Provider:   JAYNE MINDER H [[id number]]    3. Dysmenorrhea Has painful periods     Plan:    Request copy of US  Follow up in about 10 weeks for ROS

## 2023-10-13 ENCOUNTER — Ambulatory Visit: Payer: Medicaid Other

## 2023-10-15 ENCOUNTER — Telehealth (INDEPENDENT_AMBULATORY_CARE_PROVIDER_SITE_OTHER): Payer: Medicaid Other | Admitting: Nurse Practitioner

## 2023-10-15 VITALS — Ht 64.0 in | Wt 168.0 lb

## 2023-10-15 DIAGNOSIS — F331 Major depressive disorder, recurrent, moderate: Secondary | ICD-10-CM | POA: Diagnosis not present

## 2023-10-15 DIAGNOSIS — Z6830 Body mass index (BMI) 30.0-30.9, adult: Secondary | ICD-10-CM | POA: Diagnosis not present

## 2023-10-15 DIAGNOSIS — E66811 Obesity, class 1: Secondary | ICD-10-CM | POA: Diagnosis not present

## 2023-10-15 MED ORDER — WEGOVY 0.5 MG/0.5ML ~~LOC~~ SOAJ: 0.5000 mg | SUBCUTANEOUS | 0 refills | Status: DC

## 2023-10-15 NOTE — Assessment & Plan Note (Signed)
Current BMI 28.84, current weight 168. Has lost about 8% of weight. Tolerating Wegovy well. Per shared decision making will increase dose to 0.5 mg weekly injection.  Patient to follow-up in 3 months and to reach out to me after she has completed 3 doses of the 0.5 mg injection.  At that point we will consider increasing to 1 mg/week as long as patient continues to tolerate it.

## 2023-10-15 NOTE — Assessment & Plan Note (Signed)
Chronic, stable, controlled Continue Wellbutrin 150 mg daily, Lexapro 10 mg daily, and alprazolam 0.5 mg twice daily as needed for anxiety.

## 2023-10-15 NOTE — Progress Notes (Signed)
   Established Patient Office Visit  An audio/visual tele-health visit was completed today for this patient. I connected with  [name] on 10/15/23 utilizing audio/visual technology and verified that I am speaking with the correct person using two identifiers. The patient was located at their car, and I was located at the office of [facility] at [facility] during the encounter. I discussed the limitations of evaluation and management by telemedicine. The patient expressed understanding and agreed to proceed.     Subjective   Patient ID: Lindsey Sampson, female    DOB: September 02, 1981  Age: 43 y.o. MRN: [id number]  Chief Complaint  Patient presents with   Medical Management of Chronic Issues    4 weeks follow up on the weight loss medication     Obesity: Starting weight 183 pounds, starting BMI 31.41.  Current weight 168 pounds, current BMI 28.84. Has completed 4 doses of Wegovy 0.25 mg weekly injection.  Tolerating well.  Denies abdominal pain, vomiting, or dysphagia. At last office visit Wellbutrin dose was reduced to 150 mg daily and patient continues on Lexapro 10 mg daily with as needed Xanax 0.5 mg twice daily.  She reports today that her mood remained stable.     ROS: see HPI    Objective:     Ht 5\' 4"  (1.626 m)   Wt 168 lb (76.2 kg)   LMP 09/08/2023   BMI 28.84 kg/m    Physical Exam Comprehensive physical exam not completed today as office visit was conducted remotely.  Patient appears well over video.  Patient was alert and oriented, and appeared to have appropriate judgment.   No results found for any visits on 10/15/23.    The 10-year ASCVD risk score (Arnett DK, et al., 2019) is: 1.3%    Assessment & Plan:   Problem List Items Addressed This Visit       Other   Moderate episode of recurrent major depressive disorder (HCC)   Chronic, stable, controlled Continue Wellbutrin 150 mg daily, Lexapro 10 mg daily, and alprazolam 0.5 mg twice daily as  needed for anxiety.      Class 1 obesity without serious comorbidity with body mass index (BMI) of 30.0 to 30.9 in adult - Primary   Current BMI 28.84, current weight 168. Has lost about 8% of weight. Tolerating Wegovy well. Per shared decision making will increase dose to 0.5 mg weekly injection.  Patient to follow-up in 3 months and to reach out to me after she has completed 3 doses of the 0.5 mg injection.  At that point we will consider increasing to 1 mg/week as long as patient continues to tolerate it.      Relevant Medications   Semaglutide-Weight Management (WEGOVY) 0.5 MG/0.5ML SOAJ    Return in about 3 months (around 01/13/2024) for F/U with Meziah Blasingame.    Elenore Paddy, NP

## 2023-10-20 ENCOUNTER — Ambulatory Visit: Payer: Medicaid Other | Admitting: Nurse Practitioner

## 2023-10-20 ENCOUNTER — Ambulatory Visit: Payer: Self-pay | Admitting: Nurse Practitioner

## 2023-10-20 NOTE — Telephone Encounter (Signed)
Chief Complaint: sinus congestion and headache Symptoms: cough, sinus congestion, headache, ear pressure, sore throat Frequency: onset of symptoms Sunday Pertinent Negatives: Patient denies fever, CP, SOB Disposition: [] ED /[] Urgent Care (no appt availability in office) / [x] Appointment(In office/virtual)/ []  [facility]/ [] Home Care/ [] Refused Recommended Disposition /[] [facility]/ []  Follow-up with PCP Additional Notes: Pt reports sinus congestion, ear pressure, headache with 6/10 pain, non-productive cough, runny nose (green discharge), and sore throat beginning Sunday. Pt denies fever, CP, SOB. Pt states OTC decongestants and pain relievers have not been effective. Per protocol, pt scheduled appt today at 1500. Pt agreeable to plan. Pt advised to call back for any worsening symptoms, pt verbalized understanding.    Copied from CRM [id number]. Topic: Clinical - Red Word Triage >> Oct 20, 2023 12:40 PM Melissa C wrote: Red Word that prompted transfer to Nurse Triage: patient's symptoms began Sunday and she feels terrible today. Coughing, sore throat, ear pain, ear pressure, bad headaches, no fever. Patient is wanting to know if she can get something to help with all of these symptoms. Reason for Disposition  Earache  Answer Assessment - Initial Assessment Questions 1. LOCATION: "Where does it hurt?"      Ear pressure, sinus pressure in the face, headache since yesterday 2. ONSET: "When did the sinus pain start?"  (e.g., hours, days)      Sunday night, started with tickle in the throat 3. SEVERITY: "How bad is the pain?"   (Scale 1-10; mild, moderate or severe)   - MILD (1-3): doesn't interfere with normal activities    - MODERATE (4-7): interferes with normal activities (e.g., work or school) or awakens from sleep   - SEVERE (8-10): excruciating pain and patient unable to do any normal activities        6-7/10 4. RECURRENT SYMPTOM: "Have you ever had sinus problems  before?" If Yes, ask: "When was the last time?" and "What happened that time?"      "Just regular sinuses" 5. NASAL CONGESTION: "Is the nose blocked?" If Yes, ask: "Can you open it or must you breathe through your mouth?"     I can breathe out of my nose a little bit 6. NASAL DISCHARGE: "Do you have discharge from your nose?" If so ask, "What color?"     "Just a little bit", yellow/green 7. FEVER: "Do you have a fever?" If Yes, ask: "What is it, how was it measured, and when did it start?"      No 8. OTHER SYMPTOMS: "Do you have any other symptoms?" (e.g., sore throat, cough, earache, difficulty breathing)     Non-productive cough, congestion, ear ache, sore throat, weakness  Protocols used: Sinus Pain or Congestion-A-AH

## 2023-10-21 ENCOUNTER — Other Ambulatory Visit: Payer: Self-pay

## 2023-10-21 ENCOUNTER — Ambulatory Visit
Admission: RE | Admit: 2023-10-21 | Discharge: 2023-10-21 | Disposition: A | Discharge status: Home / Self Care | Payer: Medicaid Other | Source: Ambulatory Visit | Attending: Nurse Practitioner | Admitting: Nurse Practitioner

## 2023-10-21 VITALS — BP 115/78 | HR 78 | Temp 97.4°F

## 2023-10-21 DIAGNOSIS — J069 Acute upper respiratory infection, unspecified: Secondary | ICD-10-CM | POA: Diagnosis not present

## 2023-10-21 LAB — POC COVID19/FLU A&B COMBO
Covid Antigen, POC: NEGATIVE
Influenza A Antigen, POC: NEGATIVE
Influenza B Antigen, POC: NEGATIVE

## 2023-10-21 MED ORDER — IBUPROFEN 800 MG PO TABS
800.0000 mg | ORAL_TABLET | Freq: Once | ORAL | Status: AC
  Administered 2023-10-21: 800 mg via ORAL

## 2023-10-21 MED ORDER — BENZONATATE 100 MG PO CAPS: 100.0000 mg | ORAL_CAPSULE | Freq: Three times a day (TID) | ORAL | 0 refills | Status: DC | PRN

## 2023-10-21 NOTE — Discharge Instructions (Signed)
You have a viral upper respiratory infection.  Symptoms should improve over the next week to 10 days.  If you develop chest pain or shortness of breath, go to the emergency room.  COVID-19 and influenza test is negative.      Some things that can make you feel better are: - Increased rest - Increasing fluid with water/sugar free electrolytes - Acetaminophen and ibuprofen as needed for fever/pain - Salt water gargling, chloraseptic spray and throat lozenges - OTC guaifenesin (Mucinex) 600 mg twice daily for congestion - Saline sinus flushes or a neti pot - Humidifying the air -Tessalon Perles every 8 hours as needed for dry cough

## 2023-10-21 NOTE — ED Triage Notes (Signed)
Pt reports nasal congestion, headache, sore throat, bilateral ear pain, runny nose, generalized weakness, cough since Sunday.

## 2023-10-21 NOTE — ED Provider Notes (Signed)
[facility]    CSN: [id number] Arrival date & time: 10/21/23  1258      History   Chief Complaint Chief Complaint  Patient presents with   Nasal Congestion    Cough, throat hurt a little, ears hurt, headache, burning eyes, a lot of sinus pressure, feel weak - Entered by patient    HPI Lindsey Sampson is a 43 y.o. female.   Patient presents today with 4 to 5-day history of bodyaches, congested nonproductive cough, runny and stuffy nose, eyes burning, sore throat, headache, ear pain, decreased appetite, fatigue, weakness, and lightheadedness.  No chills, shortness of breath or chest pain, abdominal pain, nausea/vomiting, or diarrhea.  No known sick contacts.  Has been taking over-the-counter TheraFlu, oral decongestant, Mucinex, Tylenol, and cold liquids which helps soothe symptoms temporarily.    Past Medical History:  Diagnosis Date   Anxiety    Benign paroxysmal positional vertigo 05/08/2022   Colitis    Cyst of spleen    Depression    Ovarian cyst    Renal disorder    renal cyst,     Patient Active Problem List   Diagnosis Date Noted   Menorrhagia with regular cycle 09/28/2023   Pregnancy examination or test, negative result 09/28/2023   Encounter for general adult medical examination with abnormal findings 09/17/2023   Diabetes mellitus screening 09/17/2023   Abnormal uterine bleeding 09/17/2023   Encounter for screening mammogram for malignant neoplasm of breast 09/17/2023   Hyperlipidemia 05/21/2023   Elevated blood pressure reading 09/04/2022   Benign paroxysmal positional vertigo 05/08/2022   Class 1 obesity without serious comorbidity with body mass index (BMI) of 30.0 to 30.9 in adult 04/02/2022   Symptomatic mammary hypertrophy 03/13/2022   Neck pain 03/13/2022   Abdominal pain 03/02/2022   Anxiety 03/02/2022   Dysmenorrhea 03/02/2022   GERD (gastroesophageal reflux disease) 03/02/2022   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/02/2022    Menorrhagia with irregular cycle 03/02/2022   Bilateral serous otitis media 03/02/2022   Fatigue 03/02/2022   Screening for cardiovascular condition 03/02/2022   Screening for diabetes mellitus 03/02/2022   Moderate episode of recurrent major depressive disorder (HCC) 03/02/2022   Sacroiliac joint pain 01/09/2022   Disorder of right sacroiliac joint 01/05/2022   Back pain 12/04/2021   Open displaced fracture of proximal phalanx of right great toe 02/20/2020   Great toe pain, right 09/07/2019   Open displaced fracture of proximal phalanx of great toe 09/06/2019   Peptic ulcer disease 07/26/2019   Early satiety 07/26/2019   Liver lesion 07/26/2019   H/O tubal ligation 10/14/2018   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    Past Surgical History:  Procedure Laterality Date   BACK SURGERY     BIOPSY  11/10/2019   Procedure: BIOPSY;  Surgeon: West Bali, MD;  Location: [facility];  Service: Endoscopy;;   BREAST REDUCTION SURGERY Bilateral 12/03/2022   Procedure: BREAST REDUCTION WITH LIPOSUCTION;  Surgeon: Peggye Form, DO;  Location: [facility];  Service: Plastics;  Laterality: Bilateral;   CESAREAN SECTION     CESAREAN SECTION  2006 and 2010   cyst removed from ovary Right    ESOPHAGOGASTRODUODENOSCOPY N/A 11/10/2019   Procedure: ESOPHAGOGASTRODUODENOSCOPY (EGD);  Surgeon: West Bali, MD;  Location: [facility];  Service: Endoscopy;  Laterality: N/A;  12:00pm   LUMBAR LAMINECTOMY/DECOMPRESSION MICRODISCECTOMY Left 05/14/2016   Procedure: LUMBAR DECOMPRESSION/MICRODISCECTOMY LEFT LUMBAR FOUR-FIVE;  Surgeon: Venita Lick, MD;  Location: [facility];  Service:  Orthopedics;  Laterality: Left;   ORIF TOE FRACTURE Right 09/07/2019   Procedure: IRRIGATION AND DEBRIDEMENT AND OPEN REDUCTION INTERNAL FIXATION (ORIF) Proximal phalanx FRACTURE;  Surgeon: Roby Lofts, MD;  Location: [facility];  Service: Orthopedics;  Laterality: Right;   ORIF TOE FRACTURE Right 04/03/2020    Procedure: REPAIR OF RIGHT GREAT TOE NONUNION  WITH BONE GRAFTING RIGHT HIP;  Surgeon: Roby Lofts, MD;  Location: [facility];  Service: Orthopedics;  Laterality: Right;   TUBAL LIGATION      OB History     Gravida  2   Para  2   Term      Preterm      AB      Living  0      SAB      IAB      Ectopic      Multiple  0   Live Births  0            Home Medications    Prior to Admission medications   Medication Sig Start Date End Date Taking? Authorizing Provider  benzonatate (TESSALON) 100 MG capsule Take 1 capsule (100 mg total) by mouth 3 (three) times daily as needed for cough. Do not take with alcohol or while driving or operating heavy machinery.  May cause drowsiness. 10/21/23  Yes Valentino Nose, NP  acetaminophen (TYLENOL) 500 MG tablet Take 500 mg by mouth every 6 (six) hours as needed for mild pain.    [provider]  ALPRAZolam Prudy Feeler) 0.5 MG tablet Take 1 tablet (0.5 mg total) by mouth 2 (two) times daily as needed for anxiety. 09/17/23   Elenore Paddy, NP  buPROPion (WELLBUTRIN XL) 150 MG 24 hr tablet Take 1 tablet (150 mg total) by mouth daily. 09/17/23   Elenore Paddy, NP  escitalopram (LEXAPRO) 10 MG tablet TAKE 1 TABLET(10 MG) BY MOUTH DAILY 08/09/23   Elenore Paddy, NP  Norethindrone-Ethinyl Estradiol-Fe Biphas (LO LOESTRIN FE) 1 MG-10 MCG / 10 MCG tablet Take 1 tablet by mouth daily. Take 1 daily by mouth 09/28/23   Cyril Mourning A, NP  ondansetron (ZOFRAN) 8 MG tablet Take 1 tablet (8 mg total) by mouth 3 (three) times daily as needed. 09/17/23   Elenore Paddy, NP  Semaglutide-Weight Management (WEGOVY) 0.5 MG/0.5ML SOAJ Inject 0.5 mg into the skin once a week. 10/15/23   Elenore Paddy, NP  clonazePAM (KLONOPIN) 0.5 MG tablet Take 0.5 mg by mouth 3 (three) times daily as needed. For anxiety/sleep. 03/12/16 03/29/19  [provider]    Family History Family History  Problem Relation Age of Onset   Colon polyps Maternal  Grandmother    Hypertension Father    Prostate cancer Father    High Cholesterol Father    Diabetes Mother    Hypertension Mother    High Cholesterol Mother    Colon cancer Neg Hx     Social History Social History   Tobacco Use   Smoking status: Former    Current packs/day: 0.00    Types: Cigarettes    Quit date: 04/27/2022    Years since quitting: 1.4   Smokeless tobacco: Never   Tobacco comments:    trying to quit   Vaping Use   Vaping status: Former  Substance Use Topics   Alcohol use: Yes    Comment: rare   Drug use: No     Allergies   Patient has no known allergies.  Review of Systems Review of Systems Per HPI  Physical Exam Triage Vital Signs ED Triage Vitals  Encounter Vitals Group     BP 10/21/23 1334 115/78     Systolic BP Percentile --      Diastolic BP Percentile --      Pulse Rate 10/21/23 1334 78     Resp --      Temp 10/21/23 1334 (!) 97.4 F (36.3 C)     Temp Source 10/21/23 1334 Oral     SpO2 10/21/23 1334 97 %     Weight --      Height --      Head Circumference --      Peak Flow --      Pain Score 10/21/23 1332 8     Pain Loc --      Pain Education --      Exclude from Growth Chart --    No data found.  Updated Vital Signs BP 115/78 (BP Location: Right Arm)   Pulse 78   Temp (!) 97.4 F (36.3 C) (Oral)   LMP 10/03/2023 (Approximate)   SpO2 97%   Visual Acuity Right Eye Distance:   Left Eye Distance:   Bilateral Distance:    Right Eye Near:   Left Eye Near:    Bilateral Near:     Physical Exam Vitals and nursing note reviewed.  Constitutional:      General: She is not in acute distress.    Appearance: Normal appearance. She is not ill-appearing or toxic-appearing.  HENT:     Head: Normocephalic and atraumatic.     Right Ear: Ear canal and external ear normal. A middle ear effusion is present.     Left Ear: Ear canal and external ear normal. A middle ear effusion is present.     Nose: Congestion present. No  rhinorrhea.     Mouth/Throat:     Mouth: Mucous membranes are moist.     Pharynx: Oropharynx is clear. No oropharyngeal exudate or posterior oropharyngeal erythema.  Eyes:     General: No scleral icterus.    Extraocular Movements: Extraocular movements intact.  Cardiovascular:     Rate and Rhythm: Normal rate and regular rhythm.  Pulmonary:     Effort: Pulmonary effort is normal. No respiratory distress.     Breath sounds: Normal breath sounds. No wheezing, rhonchi or rales.  Musculoskeletal:     Cervical back: Normal range of motion and neck supple.  Lymphadenopathy:     Cervical: No cervical adenopathy.  Skin:    General: Skin is warm and dry.     Coloration: Skin is not jaundiced or pale.     Findings: No erythema or rash.  Neurological:     Mental Status: She is alert and oriented to person, place, and time.  Psychiatric:        Behavior: Behavior is cooperative.      UC Treatments / Results  Labs (all labs ordered are listed, but only abnormal results are displayed) Labs Reviewed  POC COVID19/FLU A&B COMBO - Normal    EKG   Radiology No results found.  Procedures Procedures (including critical care time)  Medications Ordered in UC Medications  ibuprofen (ADVIL) tablet 800 mg (800 mg Oral Given 10/21/23 1417)    Initial Impression / Assessment and Plan / UC Course  I have reviewed the triage vital signs and the nursing notes.  Pertinent labs & imaging results that were available during my care of the  patient were reviewed by me and considered in my medical decision making (see chart for details).   Patient is well-appearing, normotensive, afebrile, not tachycardic, not tachypneic, oxygenating well on room air.    1. Viral URI with cough Vitals and exam are reassuring today Suspect viral etiology COVID-19 and influenza testing is negative Supportive care discussed with patient Return and ER precautions discussed  The patient was given the opportunity  to ask questions.  All questions answered to their satisfaction.  The patient is in agreement to this plan.    Final Clinical Impressions(s) / UC Diagnoses   Final diagnoses:  Viral URI with cough     Discharge Instructions      You have a viral upper respiratory infection.  Symptoms should improve over the next week to 10 days.  If you develop chest pain or shortness of breath, go to the emergency room.  COVID-19 and influenza test is negative.      Some things that can make you feel better are: - Increased rest - Increasing fluid with water/sugar free electrolytes - Acetaminophen and ibuprofen as needed for fever/pain - Salt water gargling, chloraseptic spray and throat lozenges - OTC guaifenesin (Mucinex) 600 mg twice daily for congestion - Saline sinus flushes or a neti pot - Humidifying the air -Tessalon Perles every 8 hours as needed for dry cough      ED Prescriptions     Medication Sig Dispense Auth. Provider   benzonatate (TESSALON) 100 MG capsule Take 1 capsule (100 mg total) by mouth 3 (three) times daily as needed for cough. Do not take with alcohol or while driving or operating heavy machinery.  May cause drowsiness. 21 capsule Valentino Nose, NP      PDMP not reviewed this encounter.   Valentino Nose, NP 10/21/23 1419

## 2023-10-28 ENCOUNTER — Ambulatory Visit: Payer: Medicaid Other

## 2023-11-15 ENCOUNTER — Ambulatory Visit
Admission: RE | Admit: 2023-11-15 | Discharge: 2023-11-15 | Disposition: A | Discharge status: Home / Self Care | Payer: Medicaid Other | Source: Ambulatory Visit | Attending: Nurse Practitioner | Admitting: Nurse Practitioner

## 2023-11-15 DIAGNOSIS — Z1231 Encounter for screening mammogram for malignant neoplasm of breast: Secondary | ICD-10-CM

## 2023-11-19 ENCOUNTER — Encounter: Payer: Self-pay | Admitting: Nurse Practitioner

## 2023-11-24 ENCOUNTER — Other Ambulatory Visit: Payer: Self-pay | Admitting: Nurse Practitioner

## 2023-11-24 DIAGNOSIS — Z6830 Body mass index (BMI) 30.0-30.9, adult: Secondary | ICD-10-CM

## 2023-11-24 MED ORDER — WEGOVY 1 MG/0.5ML ~~LOC~~ SOAJ: 1.0000 mg | SUBCUTANEOUS | 0 refills | Status: DC

## 2023-12-03 ENCOUNTER — Ambulatory Visit: Payer: Medicaid Other | Admitting: Adult Health

## 2023-12-03 ENCOUNTER — Encounter: Payer: Self-pay | Admitting: Adult Health

## 2023-12-03 VITALS — BP 108/77 | HR 79 | Ht 64.0 in | Wt 165.5 lb

## 2023-12-03 DIAGNOSIS — N920 Excessive and frequent menstruation with regular cycle: Secondary | ICD-10-CM | POA: Diagnosis not present

## 2023-12-03 DIAGNOSIS — N946 Dysmenorrhea, unspecified: Secondary | ICD-10-CM | POA: Diagnosis not present

## 2023-12-03 NOTE — Progress Notes (Signed)
  Subjective:     Patient ID: Lindsey Sampson, female   DOB: 1981-06-15, 43 y.o.   MRN: [id number]  HPI Lindsey Sampson is a 43 year old white female,single, G2P2 back in follow up on taking lo Loestrin for heavy and painful periods and she stopped after 2 months, had on and off spotting, but did have less pain and bleeding not as heavy. We discussed endometrial ablation last visit and she wants to proceed with that.  Last pap 07/01/22 NILM  PCP is Gwenette Greet NP  Review of Systems +heavy painful periods Not currently having sex Reviewed past medical,surgical, social and family history. Reviewed medications and allergies.     Objective:   Physical Exam BP 108/77 (BP Location: Left Arm, Patient Position: Sitting, Cuff Size: Normal)   Pulse 79   Ht 5\' 4"  (1.626 m)   Wt 165 lb 8 oz (75.1 kg)   LMP 11/09/2023 (Approximate)   BMI 28.41 kg/m     Skin warm and dry. Lungs: clear to ausculation bilaterally. Cardiovascular: regular rate and rhythm.   Upstream - 12/03/23 4098       Pregnancy Intention Screening   Does the patient want to become pregnant in the next year? No    Does the patient's partner want to become pregnant in the next year? No    Would the patient like to discuss contraceptive options today? No      Contraception Wrap Up   Current Method Female Sterilization    End Method Female Sterilization    Contraception Counseling Provided No             Assessment:     1. Menorrhagia with regular cycle (Primary) Periods heavy at times, tried lo loestrin and helped a little but had on and off spotting so stopped the pills Wants to proceed with endometrial ablation, has read handout   2. Dysmenorrhea Has pain with periods, was better on lo Loestrin but stopped them due to spotting    Plan:     Return 12/21/23 to see Dr Charlotta Newton to talk about endometrial ablation

## 2023-12-07 ENCOUNTER — Ambulatory Visit: Payer: Medicaid Other | Admitting: Adult Health

## 2023-12-21 ENCOUNTER — Encounter: Payer: Self-pay | Admitting: Obstetrics & Gynecology

## 2023-12-21 ENCOUNTER — Other Ambulatory Visit (HOSPITAL_COMMUNITY)
Admission: RE | Admit: 2023-12-21 | Discharge: 2023-12-21 | Disposition: A | Discharge status: Home / Self Care | Source: Ambulatory Visit | Attending: Obstetrics & Gynecology | Admitting: Obstetrics & Gynecology

## 2023-12-21 ENCOUNTER — Ambulatory Visit: Admitting: Obstetrics & Gynecology

## 2023-12-21 VITALS — BP 123/84 | HR 71 | Ht 64.0 in | Wt 162.2 lb

## 2023-12-21 DIAGNOSIS — Z79899 Other long term (current) drug therapy: Secondary | ICD-10-CM

## 2023-12-21 DIAGNOSIS — N939 Abnormal uterine and vaginal bleeding, unspecified: Secondary | ICD-10-CM

## 2023-12-21 DIAGNOSIS — Z98891 History of uterine scar from previous surgery: Secondary | ICD-10-CM

## 2023-12-21 DIAGNOSIS — N946 Dysmenorrhea, unspecified: Secondary | ICD-10-CM

## 2023-12-21 MED ORDER — NORETHINDRONE ACETATE 5 MG PO TABS
5.0000 mg | ORAL_TABLET | Freq: Every day | ORAL | 3 refills | Status: DC
Start: 2023-12-21 — End: 2024-02-02

## 2023-12-21 NOTE — Progress Notes (Signed)
 GYN VISIT Patient name: Lindsey Sampson MRN [id number]  Date of birth: 1981/02/15 Chief Complaint:   Follow-up (Wants to talk about ablation)  History of Present Illness:   Lindsey Sampson is a 43 y.o. G2P2 female being seen today for the following concerns:  -HMB:  At least 3 yrs and menses continue to get worse.  Typically menses may be every 21-28 days- sometimes 2 in a month.  Menses may last 4 to 6 days.  Starts with a few heavy days changing tampons every hour.  Notes nausea, fatigue and headache.  Some dysmenorrhea.  Tried COC- had about 2 weeks of bleeding, but lighter.  Also tried another pill (cannot remember the name), but no improvement.  Prior C-section x 2 with salpingectomy  Patient seen by physician for women.  07/2022 ultrasound with pictures in media file.  7.6 x 4.3 x 3 cm uterus-58 cc volume.  No abnormalities noted.  Normal ovaries bilaterally  Patient's last menstrual period was 12/15/2023 (approximate).    Review of Systems:   Pertinent items are noted in HPI Denies fever/chills, dizziness, headaches, visual disturbances, fatigue, shortness of breath, chest pain, abdominal pain, vomiting. Pertinent History Reviewed:   Past Surgical History:  Procedure Laterality Date   BACK SURGERY     BIOPSY  11/10/2019   Procedure: BIOPSY;  Surgeon: West Bali, MD;  Location: [facility];  Service: Endoscopy;;   BREAST REDUCTION SURGERY Bilateral 12/03/2022   Procedure: BREAST REDUCTION WITH LIPOSUCTION;  Surgeon: Peggye Form, DO;  Location: [facility];  Service: Plastics;  Laterality: Bilateral;   CESAREAN SECTION     CESAREAN SECTION with tubal ligation  2006 and 2010   cyst removed from ovary Right    ESOPHAGOGASTRODUODENOSCOPY N/A 11/10/2019   Procedure: ESOPHAGOGASTRODUODENOSCOPY (EGD);  Surgeon: West Bali, MD;  Location: [facility];  Service: Endoscopy;  Laterality: N/A;  12:00pm   LUMBAR LAMINECTOMY/DECOMPRESSION  MICRODISCECTOMY Left 05/14/2016   Procedure: LUMBAR DECOMPRESSION/MICRODISCECTOMY LEFT LUMBAR FOUR-FIVE;  Surgeon: Venita Lick, MD;  Location: [facility];  Service: Orthopedics;  Laterality: Left;   ORIF TOE FRACTURE Right 09/07/2019   Procedure: IRRIGATION AND DEBRIDEMENT AND OPEN REDUCTION INTERNAL FIXATION (ORIF) Proximal phalanx FRACTURE;  Surgeon: Roby Lofts, MD;  Location: [facility];  Service: Orthopedics;  Laterality: Right;   ORIF TOE FRACTURE Right 04/03/2020   Procedure: REPAIR OF RIGHT GREAT TOE NONUNION  WITH BONE GRAFTING RIGHT HIP;  Surgeon: Roby Lofts, MD;  Location: [facility];  Service: Orthopedics;  Laterality: Right;   REDUCTION MAMMAPLASTY Bilateral 12/03/2022   TUBAL LIGATION      Past Medical History:  Diagnosis Date   Anxiety    Benign paroxysmal positional vertigo 05/08/2022   Colitis    Cyst of spleen    Depression    Ovarian cyst    Renal disorder    renal cyst,    Reviewed problem list, medications and allergies. Physical Assessment:   Vitals:   12/21/23 0853  BP: 123/84  Pulse: 71  Weight: 162 lb 3.2 oz (73.6 kg)  Height: 5\' 4"  (1.626 m)  Body mass index is 27.84 kg/m.       Physical Examination:   General appearance: alert, well appearing, and in no distress  Psych: mood appropriate, normal affect  Skin: warm & dry   Cardiovascular: normal heart rate noted  Respiratory: normal respiratory effort, no distress  Abdomen: soft, non-tender no rebound or guarding  Pelvic: VULVA: normal appearing vulva with  no masses, tenderness or lesions, VAGINA: normal appearing vagina with normal color and discharge, no lesions, CERVIX: normal appearing cervix without discharge or lesions, UTERUS: uterus is normal size, shape, consistency and nontender, ADNEXA: normal adnexa in size, nontender and no masses.   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  Antibiotic prophylaxis against endocarditis was not indicated.   The patient was placed in the dorsal lithotomy position.  Bimanual exam showed the uterus to be in the neutral position.  A speculum inserted in the vagina, and the cervix prepped with betadine.     A single tooth tenaculum was applied to the anterior lip of the cervix for stabilization.  Os finder was used.  A Pipelle endometrial aspirator was used to sample the endometrium.  Sample was sent for pathologic examination.  Condition: Stable  Complications: None   Assessment & Plan:  1) AUB -Prior ultrasound reviewed no abnormalities noted -reviewed all contraceptive options including pills, patch, ring, Depot or LARCs -risk/benefit and potential side effects of each were reviewed -Also discussed surgical interventions including endometrial ablation, hysterectomy -Initially patient was considering ablation however now she is leaning more towards hysterectomy as she wants to be "done" with having periods -EMB obtained to r/o underlying etiology  -Discussed robotic- assisted laparoscopic hysterectomy and bilateral salpingectomy -Explained that this surgery is performed to remove the uterus through several small incisions in the abdomen- pending anatomy 3-5 ports. I discussed the risks and benefits of the surgery, including, but not limited to risk of bleeding, including the need for blood transfusion, infection, damage to surrounding organs and tissues such as damage to bladder, ureter or bowel that would requiring additional procedures.  Reviewed long term complications such as fistula. pain or dehiscence requiring further surgical intervention.  Discussed possible  need for conversion to an open procedure and potential for other complications that cannot be predicted or prevented including DVT, PE or death. -reviewed same day procedure -typical recovery 6-8wks with several weeks off work and 8wks of pelvic rest -questions/concerns were addressed and pt desires to proceed  -once results of EMB return will call pt to finalize surgical plan **will need to stop Wegovy ~1-2 week prior to surgery  Return for TBD.   Myna Hidalgo, DO Attending Obstetrician & Gynecologist, [facility] for [employer], [facility]

## 2023-12-23 ENCOUNTER — Encounter: Payer: Self-pay | Admitting: Obstetrics & Gynecology

## 2023-12-23 LAB — SURGICAL PATHOLOGY

## 2023-12-28 ENCOUNTER — Other Ambulatory Visit: Payer: Self-pay | Admitting: Nurse Practitioner

## 2023-12-28 ENCOUNTER — Encounter: Payer: Self-pay | Admitting: Nurse Practitioner

## 2023-12-28 DIAGNOSIS — F419 Anxiety disorder, unspecified: Secondary | ICD-10-CM

## 2023-12-29 ENCOUNTER — Other Ambulatory Visit: Payer: Self-pay | Admitting: Nurse Practitioner

## 2023-12-29 DIAGNOSIS — F419 Anxiety disorder, unspecified: Secondary | ICD-10-CM

## 2023-12-29 DIAGNOSIS — E66811 Obesity, class 1: Secondary | ICD-10-CM

## 2023-12-29 MED ORDER — ALPRAZOLAM 0.5 MG PO TABS: 0.5000 mg | ORAL_TABLET | Freq: Two times a day (BID) | ORAL | 0 refills | Status: DC | PRN

## 2023-12-29 MED ORDER — WEGOVY 1.7 MG/0.75ML ~~LOC~~ SOAJ: 1.7000 mg | SUBCUTANEOUS | 1 refills | Status: DC

## 2024-01-13 ENCOUNTER — Ambulatory Visit (INDEPENDENT_AMBULATORY_CARE_PROVIDER_SITE_OTHER): Payer: Medicaid Other | Admitting: Nurse Practitioner

## 2024-01-13 VITALS — BP 106/68 | HR 74 | Temp 98.0°F | Ht 64.0 in | Wt 156.1 lb

## 2024-01-13 DIAGNOSIS — F331 Major depressive disorder, recurrent, moderate: Secondary | ICD-10-CM

## 2024-01-13 DIAGNOSIS — F419 Anxiety disorder, unspecified: Secondary | ICD-10-CM | POA: Diagnosis not present

## 2024-01-13 DIAGNOSIS — Z6830 Body mass index (BMI) 30.0-30.9, adult: Secondary | ICD-10-CM

## 2024-01-13 DIAGNOSIS — E66811 Obesity, class 1: Secondary | ICD-10-CM | POA: Diagnosis not present

## 2024-01-13 DIAGNOSIS — H8110 Benign paroxysmal vertigo, unspecified ear: Secondary | ICD-10-CM

## 2024-01-13 DIAGNOSIS — E785 Hyperlipidemia, unspecified: Secondary | ICD-10-CM

## 2024-01-13 MED ORDER — BUPROPION HCL ER (XL) 150 MG PO TB24: 150.0000 mg | ORAL_TABLET | Freq: Every day | ORAL | 3 refills | Status: DC

## 2024-01-13 MED ORDER — ONDANSETRON HCL 8 MG PO TABS: 8.0000 mg | ORAL_TABLET | Freq: Three times a day (TID) | ORAL | 1 refills | Status: DC | PRN

## 2024-01-13 NOTE — Progress Notes (Signed)
 Established Patient Office Visit  Subjective   Patient ID: Lindsey Sampson, female    DOB: [birth year]  Age: 43 y.o. MRN: [id number]  Chief Complaint  Patient presents with   Obesity   Obesity:  Starting BMI 31.41, starting weight 103 pounds.  Current weight 56 pounds, current BMI 26.80.  Currently on Wegovy 1.7 mg/week and tolerating well.  Does have upcoming hysterectomy planned and was told she needs to discontinue at least 1 week prior to surgery.  She plans on doing this as advised.  Depression/anxiety: Continues on Lexapro 10 mg daily and Wellbutrin 150 mg daily.  Take Xanax 0.5 mg twice daily as needed.  Tolerating all medications well, reports mood is stable.  Nausea: Associated with vertigo, she is requesting refill on her Zofran to have on hand as needed.     ROS: see HPI    Objective:     BP 106/68   Pulse 74   Temp 98 F (36.7 C) (Temporal)   Ht 5\' 4"  (1.626 m)   Wt 156 lb 2 oz (70.8 kg)   LMP 12/15/2023 (Approximate)   SpO2 100%   BMI 26.80 kg/m    Physical Exam Vitals reviewed.  Constitutional:      General: She is not in acute distress.    Appearance: Normal appearance.  HENT:     Head: Normocephalic and atraumatic.  Neck:     Vascular: No carotid bruit.  Cardiovascular:     Rate and Rhythm: Normal rate and regular rhythm.     Pulses: Normal pulses.     Heart sounds: Normal heart sounds.  Pulmonary:     Effort: Pulmonary effort is normal.     Breath sounds: Normal breath sounds.  Skin:    General: Skin is warm and dry.  Neurological:     General: No focal deficit present.     Mental Status: She is alert and oriented to person, place, and time.  Psychiatric:        Mood and Affect: Mood normal.        Behavior: Behavior normal.        Judgment: Judgment normal.      No results found for any visits on 01/13/24.    The 10-year ASCVD risk score (Arnett DK, et al., 2019) is: 1.3%    Assessment & Plan:   Problem List Items Addressed  This Visit       Nervous and Auditory   Benign paroxysmal positional vertigo   Chronic, intermittent Refill of Zofran approved and sent to pharmacy today.      Relevant Medications   ondansetron (ZOFRAN) 8 MG tablet     Other   Anxiety   Chronic, stable Continue Wellbutrin 150 mg daily, Lexapro 10 mg daily, and alprazolam 0.5 mg twice daily as needed.      Relevant Medications   buPROPion (WELLBUTRIN XL) 150 MG 24 hr tablet   Moderate episode of recurrent major depressive disorder (HCC)   Chronic, stable Continue Wellbutrin 150 mg daily, Lexapro 10 mg daily, and alprazolam 0.5 mg twice daily as needed.      Relevant Medications   buPROPion (WELLBUTRIN XL) 150 MG 24 hr tablet   Class 1 obesity without serious comorbidity with body mass index (BMI) of 30.0 to 30.9 in adult - Primary   Chronic Continue Wegovy 1.7 mg/week until she needs to discontinue for upcoming surgery.  Once she is cleared by surgeon to restart we will have to restart Wegovy at  0.25 mg/week x 4 weeks, then titrate up as tolerated back to 1.7 mg/week.      Hyperlipidemia   Chronic Patient is preparing to undergo surgery so will not initiate cholesterol-lowering medication at this time, but we will plan on checking fasting lipid panel at upcoming annual physical exam.  Further recommendations may be made based upon those results.       Return in about 8 months (around 09/13/2024) for F/U with Isa Manuel.    Zorita Hiss, NP

## 2024-01-13 NOTE — Assessment & Plan Note (Signed)
 Chronic, stable Continue Wellbutrin 150 mg daily, Lexapro 10 mg daily, and alprazolam 0.5 mg twice daily as needed.

## 2024-01-13 NOTE — Assessment & Plan Note (Signed)
 Chronic Patient is preparing to undergo surgery so will not initiate cholesterol-lowering medication at this time, but we will plan on checking fasting lipid panel at upcoming annual physical exam.  Further recommendations may be made based upon those results.

## 2024-01-13 NOTE — Assessment & Plan Note (Signed)
 Chronic, intermittent Refill of Zofran approved and sent to pharmacy today.

## 2024-01-13 NOTE — Assessment & Plan Note (Signed)
 Chronic Continue Wegovy 1.7 mg/week until she needs to discontinue for upcoming surgery.  Once she is cleared by surgeon to restart we will have to restart Wegovy at 0.25 mg/week x 4 weeks, then titrate up as tolerated back to 1.7 mg/week.

## 2024-01-18 ENCOUNTER — Encounter: Payer: Self-pay | Admitting: Obstetrics & Gynecology

## 2024-01-27 NOTE — Patient Instructions (Signed)
 Lindsey Sampson  01/27/2024     @PREFPERIOPPHARMACY @   Your procedure is scheduled on Wednesday, 5/7.  Report to [facility] at 0600 A.M.  Call this number if you have problems the morning of surgery:  [phone]  If you experience any cold or flu symptoms such as cough, fever, chills, shortness of breath, etc. between now and your scheduled surgery, please notify us  at the above number.   Remember:  Do not eat or drink after midnight.  You may drink clear liquids until 0330 .  Clear liquids allowed are:                    Water , Juice (No red color; non-citric and without pulp; diabetics please choose diet or no sugar options), Carbonated beverages (diabetics please choose diet or no sugar options), Clear Tea (No creamer, milk, or cream, including half & half and powdered creamer), and Black Coffee Only (No creamer, milk or cream, including half & half and powdered creamer)    Take these medicines the morning of surgery with A SIP OF WATER  tylenol , wellbutrin , lexapro , and xanax  if needed    Do not wear jewelry, make-up or nail polish, including gel polish,  artificial nails, or any other type of covering on natural nails (fingers and  toes).  Do not wear lotions, powders, or perfumes, or deodorant.  Do not shave 48 hours prior to surgery.  Men may shave face and neck.  Do not bring valuables to the hospital.  [facility] is not responsible for any belongings or valuables.  Contacts, dentures or bridgework may not be worn into surgery.  Leave your suitcase in the car.  After surgery it may be brought to your room.  For patients admitted to the hospital, discharge time will be determined by your treatment team.  Patients discharged the day of surgery will not be allowed to drive home.   Name and phone number of your driver:   family Special instructions:  Hold your Wegovy  for 1 week prior to surgery.   Please read over the following fact sheets that you were given. Pain  Booklet, Coughing and Deep Breathing, Surgical Site Infection Prevention, Anesthesia Post-op Instructions, and Care and Recovery After Surgery      General Anesthesia, Adult, Care After The following information offers guidance on how to care for yourself after your procedure. Your health care provider may also give you more specific instructions. If you have problems or questions, contact your health care provider. What can I expect after the procedure? After the procedure, it is common for people to: Have pain or discomfort at the IV site. Have nausea or vomiting. Have a sore throat or hoarseness. Have trouble concentrating. Feel cold or chills. Feel weak, sleepy, or tired (fatigue). Have soreness and body aches. These can affect parts of the body that were not involved in surgery. Follow these instructions at home: For the time period you were told by your health care provider:  Rest. Do not participate in activities where you could fall or become injured. Do not drive or use machinery. Do not drink alcohol. Do not take sleeping pills or medicines that cause drowsiness. Do not make important decisions or sign legal documents. Do not take care of children on your own. General instructions Drink enough fluid to keep your urine pale yellow. If you have sleep apnea, surgery and certain medicines can increase your risk for breathing problems. Follow instructions from your health care  provider about wearing your sleep device: Anytime you are sleeping, including during daytime naps. While taking prescription pain medicines, sleeping medicines, or medicines that make you drowsy. Return to your normal activities as told by your health care provider. Ask your health care provider what activities are safe for you. Take over-the-counter and prescription medicines only as told by your health care provider. Do not use any products that contain nicotine  or tobacco. These products include  cigarettes, chewing tobacco, and vaping devices, such as e-cigarettes. These can delay incision healing after surgery. If you need help quitting, ask your health care provider. Contact a health care provider if: You have nausea or vomiting that does not get better with medicine. You vomit every time you eat or drink. You have pain that does not get better with medicine. You cannot urinate or have bloody urine. You develop a skin rash. You have a fever. Get help right away if: You have trouble breathing. You have chest pain. You vomit blood. These symptoms may be an emergency. Get help right away. Call 911. Do not wait to see if the symptoms will go away. Do not drive yourself to the hospital. Summary After the procedure, it is common to have a sore throat, hoarseness, nausea, vomiting, or to feel weak, sleepy, or fatigue. For the time period you were told by your health care provider, do not drive or use machinery. Get help right away if you have difficulty breathing, have chest pain, or vomit blood. These symptoms may be an emergency. This information is not intended to replace advice given to you by your health care provider. Make sure you discuss any questions you have with your health care provider. Document Revised: 12/12/2021 Document Reviewed: 12/12/2021 Elsevier Patient Education  2024 Elsevier Inc.How to Use Chlorhexidine  at Home in the Shower Chlorhexidine  gluconate (CHG) is a germ-killing (antiseptic) wash that's used to clean the skin. It can get rid of the germs that normally live on the skin and can keep them away for about 24 hours. If you're having surgery, you may be told to shower with CHG at home the night before surgery. This can help lower your risk for infection. To use CHG wash in the shower, follow the steps below. Supplies needed: CHG body wash. Clean washcloth. Clean towel. How to use CHG in the shower Follow these steps unless you're told to use CHG in a  different way: Start the shower. Use your normal soap and shampoo to wash your face and hair. Turn off the shower or move out of the shower stream. Pour CHG onto a clean washcloth. Do not use any type of brush or rough sponge. Start at your neck, washing your body down to your toes. Make sure you: Wash the part of your body where the surgery will be done for at least 1 minute. Do not scrub. Do not use CHG on your head or face unless your health care provider tells you to. If it gets into your ears or eyes, rinse them well with water . Do not wash your genitals with CHG. Wash your back and under your arms. Make sure to wash skin folds. Let the CHG sit on your skin for 1-2 minutes or as long as told. Rinse your entire body in the shower, including all body creases and folds. Turn off the shower. Dry off with a clean towel. Do not put anything on your skin afterward, such as powder, lotion, or perfume. Put on clean clothes or pajamas. If  it's the night before surgery, sleep in clean sheets. General tips Use CHG only as told, and follow the instructions on the label. Use the full amount of CHG as told. This is often one bottle. Do not smoke and stay away from flames after using CHG. Your skin may feel sticky after using CHG. This is normal. The sticky feeling will go away as the CHG dries. Do not use CHG: If you have a chlorhexidine  allergy or have reacted to chlorhexidine  in the past. On open wounds or areas of skin that have broken skin, cuts, or scrapes. On babies younger than 60 months of age. Contact a health care provider if: You have questions about using CHG. Your skin gets irritated or itchy. You have a rash after using CHG. You swallow any CHG. Call your local poison control center [phone] in the [place] Your eyes itch badly, or they become very red or swollen. Your hearing changes. You have trouble seeing. If you can't reach your provider, go to an urgent care or emergency  room. Do not drive yourself. Get help right away if: You have swelling or tingling in your mouth or throat. You make high-pitched whistling sounds when you breathe, most often when you breathe out (wheeze). You have trouble breathing. These symptoms may be an emergency. Call 911 right away. Do not wait to see if the symptoms will go away. Do not drive yourself to the hospital. This information is not intended to replace advice given to you by your health care provider. Make sure you discuss any questions you have with your health care provider. Document Revised: 03/30/2023 Document Reviewed: 03/26/2022 Elsevier Patient Education  2024 Elsevier Inc.Laparoscopically Assisted Vaginal Hysterectomy  A laparoscopic assisted vaginal hysterectomy (LAVH) is a surgery to remove the uterus. The surgery is minimally invasive. This means that very small incisions are made, instead of one large incision. This surgery can be done with or without a robot. During this surgery, your fallopian tubes and ovaries may also be taken out. This surgery may be done if you have: Growths in the uterus, such as fibroids. This is the most common reason. Endometriosis. This is when the lining of the uterus is growing outside of the uterus. Pelvic organ prolapse. This is when the uterus has moved down and bulges into the vagina. Cancer of the uterus or cervix. Abnormal or heavy bleeding during your period. Long-term (chronic) pain in the pelvis. After this surgery, you'll no longer be able to have a baby, and you'll no longer have a period. Tell a health care provider about: Any allergies you have. All medicines you're taking. These include vitamins, herbs, eye drops, creams, and over-the-counter medicines. Any problems you or family members have had with anesthesia. Any bleeding problems you have. Any surgeries you've had. Any medical conditions you have. Whether you're pregnant or may be pregnant. What are the  risks? Your health care provider will talk with you about risks. These may include: Bleeding or blood clots in the legs or lungs. Infection. Damage to nearby structures or organs, including nerve, bladder, or bowel injury. Reaction to the medicines used. Early symptoms of menopause. You may have hot flashes, mood swings, dryness of the vagina, and low estrogen if both ovaries are removed. What happens before the surgery? When to stop eating and drinking Follow instructions from your provider about what you may eat and drink. These may include: 8 hours before your surgery Stop eating most foods. Do not eat meat, fried foods,  or fatty foods. Eat only light foods, such as toast or crackers. All liquids are okay except energy drinks and alcohol. 6 hours before your surgery Stop eating. Drink only clear liquids, such as water , clear fruit juice, black coffee, plain tea, and sports drinks. Do not drink energy drinks or alcohol. 2 hours before your surgery Stop drinking all liquids. You may be allowed to take medicines with small sips of water . If you don't follow your provider's instructions, your surgery may be delayed or canceled. Medicines Ask your provider about: Changing or stopping your regular medicines. These include any diabetes medicines or blood thinners you take. Taking medicines, such as aspirin  and ibuprofen . These medicines can thin your blood. Do not take them unless you are told to. Vitamins, herbs, and supplements. You may be asked to take a medicine to empty your colon (bowel prep). Surgery safety For your safety, your provider may: Remove hair at the surgery site. Ask you to wash with a soap that kills germs. Give you antibiotics. General instructions If you were asked to do a bowel prep before the surgery, do it as told by your provider. Do not use any products that contain nicotine  or tobacco for at least 4 weeks before the surgery. These products include  cigarettes, chewing tobacco, and vaping devices, such as e-cigarettes. If you need help quitting, ask your provider. What happens during the surgery? You will be given: A sedative. This helps you relax. Anesthesia. This keeps you from feeling pain. It will make you fall asleep. Your surgeon will make several small cuts, or incisions, in your belly. Tools for surgery will be inserted into the small incisions. The surgeon will take out your uterus through the vagina. Your small incisions will be closed with stitches, skin glue, or tape strips. This surgery may vary among providers and hospitals. What happens after the surgery? Your blood pressure, heart rate, breathing rate, and blood oxygen level will be monitored until you leave the hospital. You will be given pain medicine as needed. You will have a soft tube, or catheter, in place for a few hours. It will likely be removed the day after surgery. You will wear a pad because you will have bleeding and discharge from the vagina. Where to find more information [employer]st[employer]necologists: acog.org This information is not intended to replace advice given to you by your health care provider. Make sure you discuss any questions you have with your health care provider. Document Revised: 12/25/2022 Document Reviewed: 12/25/2022 Elsevier Patient Education  2024 [employer].

## 2024-01-28 ENCOUNTER — Encounter: Payer: Self-pay | Admitting: Obstetrics & Gynecology

## 2024-01-28 ENCOUNTER — Encounter (HOSPITAL_COMMUNITY)
Admission: RE | Admit: 2024-01-28 | Discharge: 2024-01-28 | Disposition: A | Discharge status: Home / Self Care | Source: Ambulatory Visit | Attending: Obstetrics & Gynecology | Admitting: Obstetrics & Gynecology

## 2024-01-28 ENCOUNTER — Other Ambulatory Visit: Payer: Self-pay

## 2024-01-28 ENCOUNTER — Encounter (HOSPITAL_COMMUNITY): Payer: Self-pay

## 2024-01-28 DIAGNOSIS — Z01812 Encounter for preprocedural laboratory examination: Secondary | ICD-10-CM | POA: Insufficient documentation

## 2024-01-28 DIAGNOSIS — N920 Excessive and frequent menstruation with regular cycle: Secondary | ICD-10-CM | POA: Diagnosis not present

## 2024-01-28 DIAGNOSIS — Z01818 Encounter for other preprocedural examination: Secondary | ICD-10-CM | POA: Diagnosis present

## 2024-01-28 LAB — CBC
HCT: 35 % — ABNORMAL LOW (ref 36.0–46.0)
Hemoglobin: 12.6 g/dL (ref 12.0–15.0)
MCH: 33.3 pg (ref 26.0–34.0)
MCHC: 36 g/dL (ref 30.0–36.0)
MCV: 92.6 fL (ref 80.0–100.0)
Platelets: 274 10*3/uL (ref 150–400)
RBC: 3.78 MIL/uL — ABNORMAL LOW (ref 3.87–5.11)
RDW: 12.3 % (ref 11.5–15.5)
WBC: 5.4 10*3/uL (ref 4.0–10.5)
nRBC: 0 % (ref 0.0–0.2)

## 2024-01-28 LAB — BASIC METABOLIC PANEL WITH GFR
Anion gap: 10 (ref 5–15)
BUN: 11 mg/dL (ref 6–20)
CO2: 17 mmol/L — ABNORMAL LOW (ref 22–32)
Calcium: 9.1 mg/dL (ref 8.9–10.3)
Chloride: 108 mmol/L (ref 98–111)
Creatinine, Ser: 0.73 mg/dL (ref 0.44–1.00)
GFR, Estimated: >60 mL/min (ref >60)
Glucose, Bld: 86 mg/dL (ref 70–99)
Potassium: 3.1 mmol/L — ABNORMAL LOW (ref 3.5–5.1)
Sodium: 135 mmol/L (ref 135–145)

## 2024-01-28 LAB — TYPE AND SCREEN
ABO/RH(D): A POS
Antibody Screen: NEGATIVE

## 2024-01-28 LAB — PREGNANCY, URINE: Preg Test, Ur: NEGATIVE

## 2024-02-01 NOTE — H&P (Signed)
 [facility] Attending History and Physical  Lindsey Sampson is a 43 y.o. G2P2 female presents for scheduled robotic assisted laparoscopic hysterectomy, bilateral salpingectomy, possible cystoscopy due to heavy menstrual bleeding.  In review, this has been an ongoing issue for the past years that continues to get worse.  She notes that menses are every month sometimes q. 21 days.  Menses will last up to 6 days soaking through a tampon every hour.  She has tried conservative management with minimal improvement.  At this time she desires permanent surgical intervention as she has completed childbearing  Denies any abnormal vaginal discharge, fevers, chills, sweats, dysuria, nausea, vomiting, other GI or GU symptoms or other general symptoms.  No acute complaints or concerns since her last visit.  Past Medical History:  Diagnosis Date   Anxiety    Benign paroxysmal positional vertigo 05/08/2022   Colitis    Cyst of spleen    Depression    Ovarian cyst    Renal disorder    renal cyst,    Past Surgical History:  Procedure Laterality Date   BACK SURGERY     BIOPSY  11/10/2019   Procedure: BIOPSY;  Surgeon: Alyce Jubilee, MD;  [facility]: [facility];  Service: Endoscopy;;   BREAST REDUCTION SURGERY Bilateral 12/03/2022   Procedure: BREAST REDUCTION WITH LIPOSUCTION;  Surgeon: Thornell Flirt, DO;  Location:  SURGERY CENTER;  Service: Plastics;  Laterality: Bilateral;   CESAREAN SECTION     CESAREAN SECTION  2006 and 2010   cyst removed from ovary Right    ESOPHAGOGASTRODUODENOSCOPY N/A 11/10/2019   Procedure: ESOPHAGOGASTRODUODENOSCOPY (EGD);  Surgeon: Alyce Jubilee, MD;  [facility]: [facility];  Service: Endoscopy;  Laterality: N/A;  12:00pm   LUMBAR LAMINECTOMY/DECOMPRESSION MICRODISCECTOMY Left 05/14/2016   Procedure: LUMBAR DECOMPRESSION/MICRODISCECTOMY LEFT LUMBAR FOUR-FIVE;  Surgeon: Mort Ards, MD;  Location: [facility];  Service:  Orthopedics;  Laterality: Left;   ORIF TOE FRACTURE Right 09/07/2019   Procedure: IRRIGATION AND DEBRIDEMENT AND OPEN REDUCTION INTERNAL FIXATION (ORIF) Proximal phalanx FRACTURE;  Surgeon: Laneta Pintos, MD;  Location: [facility];  Service: Orthopedics;  Laterality: Right;   ORIF TOE FRACTURE Right 04/03/2020   Procedure: REPAIR OF RIGHT GREAT TOE NONUNION  WITH BONE GRAFTING RIGHT HIP;  Surgeon: Laneta Pintos, MD;  Location: [facility];  Service: Orthopedics;  Laterality: Right;   REDUCTION MAMMAPLASTY Bilateral 12/03/2022   TUBAL LIGATION     OB History  Gravida Para Term Preterm AB Living  2 2    2   SAB IAB Ectopic Multiple Live Births     0 0    # Outcome Date GA Lbr Len/2nd Weight Sex Type Anes PTL Lv  2 Para           1 Para           Patient denies any other pertinent gynecologic issues.  No current facility-administered medications on file prior to encounter.   Current Outpatient Medications on File Prior to Encounter  Medication Sig Dispense Refill   acetaminophen  (TYLENOL ) 500 MG tablet Take 1,000 mg by mouth every 6 (six) hours as needed for mild pain (pain score 1-3).     ALPRAZolam  (XANAX ) 0.5 MG tablet Take 1 tablet (0.5 mg total) by mouth 2 (two) times daily as needed for anxiety. 30 tablet 0   buPROPion  (WELLBUTRIN  XL) 150 MG 24 hr tablet Take 1 tablet (150 mg total) by mouth daily. 90 tablet 3   escitalopram  (  LEXAPRO ) 10 MG tablet TAKE 1 TABLET(10 MG) BY MOUTH DAILY 90 tablet 1   norethindrone  (AYGESTIN ) 5 MG tablet Take 1 tablet (5 mg total) by mouth daily. (Patient not taking: Reported on 01/24/2024) 30 tablet 3   ondansetron  (ZOFRAN ) 8 MG tablet Take 1 tablet (8 mg total) by mouth 3 (three) times daily as needed. 20 tablet 1   Semaglutide -Weight Management (WEGOVY ) 1.7 MG/0.75ML SOAJ Inject 1.7 mg into the skin once a week. 3 mL 1   [DISCONTINUED] clonazePAM  (KLONOPIN ) 0.5 MG tablet Take 0.5 mg by mouth 3 (three) times daily as needed. For anxiety/sleep.  1   No Known  Allergies  Social History:   reports that she quit smoking about 21 months ago. Her smoking use included cigarettes. She has never used smokeless tobacco. She reports current alcohol use. She reports that she does not use drugs. Family History  Problem Relation Age of Onset   Diabetes Mother    Hypertension Mother    High Cholesterol Mother    Hypertension Father    Prostate cancer Father    High Cholesterol Father    Colon polyps Maternal Grandmother    Colon cancer Neg Hx    Breast cancer Neg Hx    BRCA 1/2 Neg Hx     Review of Systems: Pertinent items noted in HPI and remainder of comprehensive ROS otherwise negative.  PHYSICAL EXAM: Blood pressure 101/64, pulse 61, temperature 97.7 F (36.5 C), temperature source Oral, resp. rate 18, SpO2 100%. CONSTITUTIONAL: Well-developed, well-nourished female in no acute distress.  SKIN: Skin is warm and dry. No rash noted. Not diaphoretic. No erythema. No pallor. NEUROLOGIC: Alert and oriented to person, place, and time. Normal reflexes, muscle tone coordination. No cranial nerve deficit noted. PSYCHIATRIC: Normal mood and affect. Normal behavior. Normal judgment and thought content. CARDIOVASCULAR: Normal heart rate noted, regular rhythm RESPIRATORY: Effort and breath sounds normal, no problems with respiration noted ABDOMEN: Soft, nontender, nondistended. PELVIC: deferred MUSCULOSKELETAL: no calf tenderness bilaterally EXT: no edema bilaterally, normal pulses  Labs: Results for orders placed or performed during the hospital encounter of 01/28/24 (from the past 2 weeks)  CBC   Collection Time: 01/28/24  1:06 PM  Result Value Ref Range   WBC 5.4 4.0 - 10.5 K/uL   RBC 3.78 (L) 3.87 - 5.11 MIL/uL   Hemoglobin 12.6 12.0 - 15.0 g/dL   HCT 84.6 (L) 96.2 - 95.2 %   MCV 92.6 80.0 - 100.0 fL   MCH 33.3 26.0 - 34.0 pg   MCHC 36.0 30.0 - 36.0 g/dL   RDW 84.1 32.4 - 40.1 %   Platelets 274 150 - 400 K/uL   nRBC 0.0 0.0 - 0.2 %  Basic  metabolic panel   Collection Time: 01/28/24  1:06 PM  Result Value Ref Range   Sodium 135 135 - 145 mmol/L   Potassium 3.1 (L) 3.5 - 5.1 mmol/L   Chloride 108 98 - 111 mmol/L   CO2 17 (L) 22 - 32 mmol/L   Glucose, Bld 86 70 - 99 mg/dL   BUN 11 6 - 20 mg/dL   Creatinine, Ser 0.27 0.44 - 1.00 mg/dL   Calcium 9.1 8.9 - 25.3 mg/dL   GFR, Estimated >66 >44 mL/min   Anion gap 10 5 - 15  Pregnancy, urine   Collection Time: 01/28/24  1:06 PM  Result Value Ref Range   Preg Test, Ur NEGATIVE NEGATIVE  Type and screen   Collection Time: 01/28/24  1:06 PM  Result Value Ref Range   ABO/RH(D) A POS    Antibody Screen NEG    Sample Expiration 02/11/2024,2359    Extend sample reason      NO TRANSFUSIONS OR PREGNANCY IN THE PAST 3 MONTHS Performed at [facility], [address] [place], [place] [address]     Imaging Studies: 07/2022 ultrasound with pictures in media file.  7.6 x 4.3 x 3 cm uterus-58 cc volume.  No abnormalities noted.  Normal ovaries bilaterally    Assessment: Heavy menstrual bleeding Prior C-section x 2  Plan: Robotic assisted laparoscopic hysterectomy, bilateral salpingectomy, possible cystoscopy -Ancef  2 g IV - Toradol  IV ordered -NPO -LR @ 125cc/hr -SCDs to OR -Risk/benefits and alternatives reviewed with the patient including but not limited to risk of bleeding, infection and injury to surrounding organs requiring further surgical intervention.  Also discussed patient risk of open procedure.  Full consent previously reviewed where other potential complications and risks were reviewed.  Questions and concerns were addressed and pt desires to proceed  Tirth Cothron, DO Attending Obstetrician & Gynecologist, [facility] for [facility], [facility]

## 2024-02-01 NOTE — OR Nursing (Signed)
 Potassium 3.1. Reported to Dr. Margrette Shield

## 2024-02-02 ENCOUNTER — Encounter (HOSPITAL_COMMUNITY)
Admission: RE | Disposition: A | Discharge status: Home / Self Care | Payer: Self-pay | Source: Home / Self Care | Attending: Obstetrics & Gynecology

## 2024-02-02 ENCOUNTER — Encounter (HOSPITAL_COMMUNITY): Payer: Self-pay | Admitting: Obstetrics & Gynecology

## 2024-02-02 ENCOUNTER — Ambulatory Visit (HOSPITAL_COMMUNITY): Payer: Self-pay

## 2024-02-02 ENCOUNTER — Ambulatory Visit (HOSPITAL_COMMUNITY)
Admission: RE | Admit: 2024-02-02 | Discharge: 2024-02-02 | Disposition: A | Discharge status: Home / Self Care | Attending: Obstetrics & Gynecology | Admitting: Obstetrics & Gynecology

## 2024-02-02 ENCOUNTER — Ambulatory Visit (HOSPITAL_BASED_OUTPATIENT_CLINIC_OR_DEPARTMENT_OTHER): Payer: Self-pay

## 2024-02-02 DIAGNOSIS — Z87891 Personal history of nicotine dependence: Secondary | ICD-10-CM | POA: Insufficient documentation

## 2024-02-02 DIAGNOSIS — K219 Gastro-esophageal reflux disease without esophagitis: Secondary | ICD-10-CM | POA: Diagnosis not present

## 2024-02-02 DIAGNOSIS — F32A Depression, unspecified: Secondary | ICD-10-CM | POA: Insufficient documentation

## 2024-02-02 DIAGNOSIS — Z98891 History of uterine scar from previous surgery: Secondary | ICD-10-CM | POA: Insufficient documentation

## 2024-02-02 DIAGNOSIS — N946 Dysmenorrhea, unspecified: Secondary | ICD-10-CM | POA: Diagnosis not present

## 2024-02-02 DIAGNOSIS — Z79899 Other long term (current) drug therapy: Secondary | ICD-10-CM | POA: Diagnosis not present

## 2024-02-02 DIAGNOSIS — K279 Peptic ulcer, site unspecified, unspecified as acute or chronic, without hemorrhage or perforation: Secondary | ICD-10-CM | POA: Insufficient documentation

## 2024-02-02 DIAGNOSIS — N920 Excessive and frequent menstruation with regular cycle: Secondary | ICD-10-CM | POA: Diagnosis not present

## 2024-02-02 DIAGNOSIS — F419 Anxiety disorder, unspecified: Secondary | ICD-10-CM | POA: Diagnosis not present

## 2024-02-02 DIAGNOSIS — Z01818 Encounter for other preprocedural examination: Secondary | ICD-10-CM

## 2024-02-02 DIAGNOSIS — N939 Abnormal uterine and vaginal bleeding, unspecified: Secondary | ICD-10-CM | POA: Diagnosis not present

## 2024-02-02 DIAGNOSIS — I10 Essential (primary) hypertension: Secondary | ICD-10-CM | POA: Diagnosis not present

## 2024-02-02 DIAGNOSIS — Z793 Long term (current) use of hormonal contraceptives: Secondary | ICD-10-CM | POA: Diagnosis not present

## 2024-02-02 HISTORY — PX: ROBOTIC ASSISTED TOTAL HYSTERECTOMY WITH BILATERAL SALPINGO OOPHERECTOMY: SHX6086

## 2024-02-02 SURGERY — HYSTERECTOMY, TOTAL, ROBOT-ASSISTED, LAPAROSCOPIC, WITH BILATERAL SALPINGO-OOPHORECTOMY
Anesthesia: General | Site: Abdomen | Laterality: Bilateral

## 2024-02-02 MED ORDER — SCOPOLAMINE 1 MG/3DAYS TD PT72
1.0000 | MEDICATED_PATCH | TRANSDERMAL | Status: DC
  Administered 2024-02-02: 1.5 mg via TRANSDERMAL

## 2024-02-02 MED ORDER — DEXAMETHASONE SODIUM PHOSPHATE 10 MG/ML IJ SOLN
INTRAMUSCULAR | Status: DC | PRN
  Administered 2024-02-02: 10 mg via INTRAVENOUS

## 2024-02-02 MED ORDER — ONDANSETRON HCL 4 MG/2ML IJ SOLN: 4.0000 mg | Freq: Once | INTRAMUSCULAR | Status: DC | PRN

## 2024-02-02 MED ORDER — PROPOFOL 10 MG/ML IV BOLUS
INTRAVENOUS | Status: AC
  Filled 2024-02-02: qty 20

## 2024-02-02 MED ORDER — LACTATED RINGERS IV SOLN: INTRAVENOUS | Status: DC

## 2024-02-02 MED ORDER — GABAPENTIN 300 MG PO CAPS: 300.0000 mg | ORAL_CAPSULE | Freq: Three times a day (TID) | ORAL | 0 refills | Status: DC

## 2024-02-02 MED ORDER — PROPOFOL 500 MG/50ML IV EMUL
INTRAVENOUS | Status: AC
  Filled 2024-02-02: qty 50

## 2024-02-02 MED ORDER — FENTANYL CITRATE PF 50 MCG/ML IJ SOSY: 25.0000 ug | PREFILLED_SYRINGE | INTRAMUSCULAR | Status: DC | PRN

## 2024-02-02 MED ORDER — ROCURONIUM BROMIDE 10 MG/ML (PF) SYRINGE
PREFILLED_SYRINGE | INTRAVENOUS | Status: DC | PRN
  Administered 2024-02-02: 30 mg via INTRAVENOUS
  Administered 2024-02-02: 70 mg via INTRAVENOUS

## 2024-02-02 MED ORDER — IBUPROFEN 600 MG PO TABS: 600.0000 mg | ORAL_TABLET | Freq: Four times a day (QID) | ORAL | 0 refills | Status: DC | PRN

## 2024-02-02 MED ORDER — PHENYLEPHRINE 80 MCG/ML (10ML) SYRINGE FOR IV PUSH (FOR BLOOD PRESSURE SUPPORT)
PREFILLED_SYRINGE | INTRAVENOUS | Status: DC | PRN
  Administered 2024-02-02: 160 ug via INTRAVENOUS
  Administered 2024-02-02 (×2): 80 ug via INTRAVENOUS

## 2024-02-02 MED ORDER — HYDROMORPHONE HCL 1 MG/ML IJ SOLN
INTRAMUSCULAR | Status: DC | PRN
  Administered 2024-02-02 (×2): .5 mg via INTRAVENOUS

## 2024-02-02 MED ORDER — ORAL CARE MOUTH RINSE: 15.0000 mL | Freq: Once | OROMUCOSAL | Status: AC

## 2024-02-02 MED ORDER — ONDANSETRON HCL 4 MG/2ML IJ SOLN
INTRAMUSCULAR | Status: DC | PRN
  Administered 2024-02-02: 4 mg via INTRAVENOUS

## 2024-02-02 MED ORDER — DOCUSATE SODIUM 100 MG PO CAPS: 100.0000 mg | ORAL_CAPSULE | Freq: Two times a day (BID) | ORAL | 0 refills | Status: DC

## 2024-02-02 MED ORDER — FENTANYL CITRATE (PF) 100 MCG/2ML IJ SOLN
INTRAMUSCULAR | Status: AC
  Filled 2024-02-02: qty 2

## 2024-02-02 MED ORDER — SCOPOLAMINE 1 MG/3DAYS TD PT72
MEDICATED_PATCH | TRANSDERMAL | Status: DC
Start: 2024-02-02 — End: 2024-02-02
  Filled 2024-02-02: qty 1

## 2024-02-02 MED ORDER — CEFAZOLIN SODIUM-DEXTROSE 2-4 GM/100ML-% IV SOLN
2.0000 g | INTRAVENOUS | Status: AC
  Administered 2024-02-02: 2 g via INTRAVENOUS
  Filled 2024-02-02: qty 100

## 2024-02-02 MED ORDER — BUPIVACAINE HCL 0.25 % IJ SOLN
INTRAMUSCULAR | Status: DC | PRN
  Administered 2024-02-02: 40 mL

## 2024-02-02 MED ORDER — EPHEDRINE SULFATE-NACL 50-0.9 MG/10ML-% IV SOSY
PREFILLED_SYRINGE | INTRAVENOUS | Status: DC | PRN
  Administered 2024-02-02: 10 mg via INTRAVENOUS

## 2024-02-02 MED ORDER — PROPOFOL 500 MG/50ML IV EMUL
INTRAVENOUS | Status: AC
  Filled 2024-02-02: qty 100

## 2024-02-02 MED ORDER — DEXAMETHASONE SODIUM PHOSPHATE 10 MG/ML IJ SOLN
INTRAMUSCULAR | Status: AC
  Filled 2024-02-02: qty 1

## 2024-02-02 MED ORDER — MIDAZOLAM HCL 2 MG/2ML IJ SOLN
INTRAMUSCULAR | Status: AC
  Filled 2024-02-02: qty 2

## 2024-02-02 MED ORDER — KETOROLAC TROMETHAMINE 10 MG PO TABS: 10.0000 mg | ORAL_TABLET | Freq: Three times a day (TID) | ORAL | 0 refills | Status: AC

## 2024-02-02 MED ORDER — MIDAZOLAM HCL 2 MG/2ML IJ SOLN
INTRAMUSCULAR | Status: DC | PRN
  Administered 2024-02-02: 2 mg via INTRAVENOUS

## 2024-02-02 MED ORDER — OXYCODONE HCL 5 MG/5ML PO SOLN: 5.0000 mg | Freq: Once | ORAL | Status: DC | PRN

## 2024-02-02 MED ORDER — SODIUM CHLORIDE 0.9 % IR SOLN
Status: DC | PRN
  Administered 2024-02-02: 1000 mL

## 2024-02-02 MED ORDER — OXYCODONE HCL 5 MG PO TABS: 5.0000 mg | ORAL_TABLET | Freq: Four times a day (QID) | ORAL | 0 refills | Status: DC | PRN

## 2024-02-02 MED ORDER — HYDRALAZINE HCL 20 MG/ML IJ SOLN
INTRAMUSCULAR | Status: AC
  Filled 2024-02-02: qty 1

## 2024-02-02 MED ORDER — PROPOFOL 500 MG/50ML IV EMUL
INTRAVENOUS | Status: DC | PRN
Start: 2024-02-02 — End: 2024-02-02
  Administered 2024-02-02: 150 ug/kg/min via INTRAVENOUS
  Administered 2024-02-02: 125 ug/kg/min via INTRAVENOUS

## 2024-02-02 MED ORDER — KETOROLAC TROMETHAMINE 30 MG/ML IJ SOLN
30.0000 mg | INTRAMUSCULAR | Status: AC
  Administered 2024-02-02: 30 mg via INTRAVENOUS
  Filled 2024-02-02: qty 1

## 2024-02-02 MED ORDER — LIDOCAINE 2% (20 MG/ML) 5 ML SYRINGE
INTRAMUSCULAR | Status: DC | PRN
  Administered 2024-02-02: 80 mg via INTRAVENOUS

## 2024-02-02 MED ORDER — BUPIVACAINE HCL (PF) 0.25 % IJ SOLN
INTRAMUSCULAR | Status: AC
  Filled 2024-02-02: qty 60

## 2024-02-02 MED ORDER — HYDROMORPHONE HCL 1 MG/ML IJ SOLN
INTRAMUSCULAR | Status: AC
  Filled 2024-02-02: qty 0.5

## 2024-02-02 MED ORDER — CHLORHEXIDINE GLUCONATE 0.12 % MT SOLN
15.0000 mL | Freq: Once | OROMUCOSAL | Status: AC
  Administered 2024-02-02: 15 mL via OROMUCOSAL

## 2024-02-02 MED ORDER — LIDOCAINE 2% (20 MG/ML) 5 ML SYRINGE
INTRAMUSCULAR | Status: AC
  Filled 2024-02-02: qty 5

## 2024-02-02 MED ORDER — SUGAMMADEX SODIUM 200 MG/2ML IV SOLN
INTRAVENOUS | Status: DC | PRN
  Administered 2024-02-02: 200 mg via INTRAVENOUS

## 2024-02-02 MED ORDER — ACETAMINOPHEN 10 MG/ML IV SOLN
INTRAVENOUS | Status: DC | PRN
  Administered 2024-02-02: 1000 mg via INTRAVENOUS

## 2024-02-02 MED ORDER — PHENYLEPHRINE 80 MCG/ML (10ML) SYRINGE FOR IV PUSH (FOR BLOOD PRESSURE SUPPORT)
PREFILLED_SYRINGE | INTRAVENOUS | Status: AC
  Filled 2024-02-02: qty 10

## 2024-02-02 MED ORDER — POVIDONE-IODINE 10 % EX SWAB: 2.0000 | Freq: Once | CUTANEOUS | Status: DC

## 2024-02-02 MED ORDER — ROCURONIUM BROMIDE 10 MG/ML (PF) SYRINGE
PREFILLED_SYRINGE | INTRAVENOUS | Status: AC
  Filled 2024-02-02: qty 10

## 2024-02-02 MED ORDER — STERILE WATER FOR IRRIGATION IR SOLN
Status: DC | PRN
  Administered 2024-02-02: 500 mL

## 2024-02-02 MED ORDER — ONDANSETRON HCL 4 MG/2ML IJ SOLN
INTRAMUSCULAR | Status: AC
  Filled 2024-02-02: qty 2

## 2024-02-02 MED ORDER — OXYCODONE HCL 5 MG PO TABS: 5.0000 mg | ORAL_TABLET | Freq: Once | ORAL | Status: DC | PRN

## 2024-02-02 MED ORDER — HEMOSTATIC AGENTS (NO CHARGE) OPTIME
TOPICAL | Status: DC | PRN
Start: 2024-02-02 — End: 2024-02-02
  Administered 2024-02-02: 1 via TOPICAL

## 2024-02-02 MED ORDER — FENTANYL CITRATE (PF) 100 MCG/2ML IJ SOLN
INTRAMUSCULAR | Status: AC
Start: 2024-02-02 — End: ?
  Filled 2024-02-02: qty 2

## 2024-02-02 MED ORDER — ACETAMINOPHEN 10 MG/ML IV SOLN
INTRAVENOUS | Status: AC
  Filled 2024-02-02: qty 100

## 2024-02-02 MED ORDER — EPHEDRINE 5 MG/ML INJ
INTRAVENOUS | Status: AC
  Filled 2024-02-02: qty 5

## 2024-02-02 MED ORDER — ARTIFICIAL TEARS OPHTHALMIC OINT
TOPICAL_OINTMENT | OPHTHALMIC | Status: AC
  Filled 2024-02-02: qty 3.5

## 2024-02-02 MED ORDER — PROPOFOL 10 MG/ML IV BOLUS
INTRAVENOUS | Status: DC | PRN
  Administered 2024-02-02: 150 mg via INTRAVENOUS

## 2024-02-02 MED ORDER — FENTANYL CITRATE (PF) 100 MCG/2ML IJ SOLN
INTRAMUSCULAR | Status: DC | PRN
  Administered 2024-02-02: 100 ug via INTRAVENOUS
  Administered 2024-02-02: 50 ug via INTRAVENOUS
  Administered 2024-02-02: 100 ug via INTRAVENOUS
  Administered 2024-02-02: 50 ug via INTRAVENOUS

## 2024-02-02 SURGICAL SUPPLY — 48 items
BAG URINE DRAIN 2000ML AR STRL (UROLOGICAL SUPPLIES) ×2 IMPLANT
BLADE SURG SZ11 CARB STEEL (BLADE) ×2 IMPLANT
CATH FOLEY 3WAY 30CC 16FR (CATHETERS) ×2 IMPLANT
CAUTERY HOOK MNPLR 1.6 DVNC XI (INSTRUMENTS) ×2 IMPLANT
CHLORAPREP W/TINT 26 (MISCELLANEOUS) ×2 IMPLANT
COVER LIGHT HANDLE STERIS (MISCELLANEOUS) ×4 IMPLANT
DERMABOND ADVANCED .7 DNX12 (GAUZE/BANDAGES/DRESSINGS) ×2 IMPLANT
DRAPE ARM DVNC X/XI (DISPOSABLE) ×8 IMPLANT
DRAPE COLUMN DVNC XI (DISPOSABLE) ×1 IMPLANT
DRAPE UTILITY W/TAPE 26X15 (DRAPES) ×4 IMPLANT
DRIVER NDL MEGA 8 DVNC XI (INSTRUMENTS) ×2 IMPLANT
DRIVER NDLE MEGA DVNC XI (INSTRUMENTS) ×4 IMPLANT
ELECTRODE REM PT RTRN 9FT ADLT (ELECTROSURGICAL) ×2 IMPLANT
FORCEPS PROGRASP DVNC XI (FORCEP) ×1 IMPLANT
GAUZE 4X4 16PLY ~~LOC~~+RFID DBL (SPONGE) ×4 IMPLANT
GLOVE BIO SURGEON STRL SZ 6.5 (GLOVE) ×6 IMPLANT
GLOVE BIOGEL PI IND STRL 7.0 (GLOVE) ×12 IMPLANT
GOWN STRL REUS W/ TWL LRG LVL3 (GOWN DISPOSABLE) ×6 IMPLANT
GOWN STRL REUS W/TWL LRG LVL3 (GOWN DISPOSABLE) ×4 IMPLANT
KIT PINK PAD W/HEAD ARE REST (MISCELLANEOUS) ×2 IMPLANT
KIT PINK PAD W/HEAD ARM REST (MISCELLANEOUS) ×2 IMPLANT
KIT TURNOVER CYSTO (KITS) ×2 IMPLANT
MANIFOLD NEPTUNE II (INSTRUMENTS) ×2 IMPLANT
MANIPULATOR VCARE LG CRV RETR (MISCELLANEOUS) ×1 IMPLANT
NDL HYPO 25X1 1.5 SAFETY (NEEDLE) ×1 IMPLANT
NEEDLE HYPO 25X1 1.5 SAFETY (NEEDLE) ×2 IMPLANT
NS IRRIG 500ML POUR BTL (IV SOLUTION) ×2 IMPLANT
OBTURATOR OPTICALSTD 8 DVNC (TROCAR) ×2 IMPLANT
PACK PERI GYN (CUSTOM PROCEDURE TRAY) ×2 IMPLANT
POSITIONER HEAD 8X9X4 ADT (SOFTGOODS) ×2 IMPLANT
POWDER SURGICEL 3.0 GRAM (HEMOSTASIS) ×1 IMPLANT
SEAL UNIV 5-12 XI (MISCELLANEOUS) ×6 IMPLANT
SEALER VESSEL EXT DVNC XI (MISCELLANEOUS) ×2 IMPLANT
SET BASIN LINEN APH (SET/KITS/TRAYS/PACK) ×2 IMPLANT
SET TRI-LUMEN FLTR TB AIRSEAL (TUBING) ×2 IMPLANT
SET TUBE IRRIG SUCTION NO TIP (IRRIGATION / IRRIGATOR) ×1 IMPLANT
SPONGE T-LAP 18X18 ~~LOC~~+RFID (SPONGE) ×1 IMPLANT
SUT MNCRL AB 4-0 PS2 18 (SUTURE) ×3 IMPLANT
SUT STRATAFIX PDS 30 CT-1 (SUTURE) ×1 IMPLANT
SUT VIC AB 2-0 CT2 27 (SUTURE) ×1 IMPLANT
SUTURE DVC VLC 180 0 12IN GS21 (SUTURE) ×2 IMPLANT
SYR 10ML LL (SYRINGE) ×4 IMPLANT
SYR 20ML LL LF (SYRINGE) ×2 IMPLANT
SYR 50ML LL SCALE MARK (SYRINGE) ×2 IMPLANT
SYR CONTROL 10ML LL (SYRINGE) ×2 IMPLANT
TIP ENDOSCOPIC SURGICEL (TIP) ×1 IMPLANT
TROCAR PORT AIRSEAL 8X120 (TROCAR) ×2 IMPLANT
WATER STERILE IRR 500ML POUR (IV SOLUTION) ×2 IMPLANT

## 2024-02-02 NOTE — Discharge Instructions (Addendum)
 Post Operative Pain Med Plan:  >Take gabapentin  300 mg three times per day, as prescribed for 4 days, try to space them evenly  >Take the Toradol  every 8 hours for the first 3 days.  After the toradol  is gone switch to Ibuprofen  600mg  every 6 hours as needed.  DO NOT TAKE the Toradol  and Ibuprofen  together- take one or the other.  >In between the Toradol , take Tylenol  (over the counter) every 6 to 8 hours.  If the Tylenol  does not seem strong enough.  Take the tylenol  along with the oxycodone  every 6 hours If the oxycodone  seems "to strong" then just take Tylenol   >Oxycodone  will cause constipation, please be sure to take a stool softener (Colace) twice daily while taking this pain medication and/or continue this medication until your bowel regimen returns to normal  If possible try to take the Toradol  or Ibuprofen  with food to help avoid upsetting your stomach  >Use a heating pad as well as needed  >I have also sent a prescription for zofran  (ondansetron ) for nausea to take if needed over the first couple of days  >Be gentle with your diet the first few days, liquids and soft non spicy food, fruits are great  >Get up and move, no lifting or straining HOME INSTRUCTIONS  Please note any unusual or excessive bleeding, pain, swelling. Mild dizziness or drowsiness are normal for about 24 hours after surgery.   Shower when comfortable  Restrictions: No driving for 24 hours or while taking pain medications.  Activity:  No heavy lifting (> 10 lbs), nothing in vagina (no tampons, douching, or intercourse) x 8 weeks; no tub baths for 8 weeks Vaginal spotting is expected but if your bleeding is heavy, period like,  please call the office   Incision: the bandaids will fall off when they are ready to; you may clean your incision with mild soap and water  but do not rub or scrub the incision site.  You may experience slight bloody drainage from your incision periodically.  This is normal.  If you  experience a large amount of drainage or the incision opens, please call your physician who will likely direct you to the emergency department.  Diet:  You may return to your regular diet.  Do not eat large meals.  Eat small frequent meals throughout the day.  Continue to drink a good amount of water  at least 6-8 glasses of water  per day, hydration is very important for the healing process.  Pain Management: Follow the instructions as above.    Always take prescription pain medication with food.  Oxycodone  may cause constipation, you may want to take a stool softener while taking this medication.  A prescription of colace has been sent in to take twice daily if needed while taking the oxycodone .  Be sure to drink plenty of fluids and increase your fiber to help with constipation.  Alcohol -- Avoid for 24 hours and while taking pain medications.  Nausea: Take sips of ginger ale or soda  Fever -- Call physician if temperature over 101 degrees  Follow up:  If you do not already have a follow up appointment scheduled, please call the office at [phone].  If you experience fever (a temperature greater than 100.4), pain unrelieved by pain medication, shortness of breath, swelling of a single leg, or any other symptoms which are concerning to you please the office immediately.

## 2024-02-02 NOTE — Transfer of Care (Signed)
 Immediate Anesthesia Transfer of Care Note  Patient: Lindsey Sampson  Procedure(s) Performed: HYSTERECTOMY, TOTAL, ROBOT-ASSISTED, LAPAROSCOPIC, WITH BILATERAL SALPINGECTOMY (Bilateral: Abdomen)  Patient Location: PACU  Anesthesia Type:General  Level of Consciousness: drowsy and patient cooperative  Airway & Oxygen Therapy: Patient Spontanous Breathing and Patient connected to face mask oxygen  Post-op Assessment: Report given to RN and Post -op Vital signs reviewed and stable  Post vital signs: Reviewed and stable  Last Vitals:  Vitals Value Taken Time  BP 120/72 02/02/24 1055  Temp 36.6 C 02/02/24 1055  Pulse 97.8 02/02/24   1055  Resp 10 02/02/24 1055  SpO2 100 % 02/02/24 1055    Last Pain:  Vitals:   02/02/24 0655  TempSrc: Oral  PainSc: 0-No pain      Patients Stated Pain Goal: 5 (02/02/24 0655)  Complications: No notable events documented.

## 2024-02-02 NOTE — Anesthesia Procedure Notes (Addendum)
 Procedure Name: Intubation Date/Time: 02/02/2024 7:43 AM  Performed by: Juluis Ok, CRNAPre-anesthesia Checklist: Patient identified, Emergency Drugs available, Suction available and Patient being monitored Patient Re-evaluated:Patient Re-evaluated prior to induction Oxygen Delivery Method: Circle system utilized Preoxygenation: Pre-oxygenation with 100% oxygen Induction Type: IV induction Ventilation: Mask ventilation without difficulty Laryngoscope Size: Mac and 4 Grade View: Grade III Tube type: Oral Tube size: 7.0 mm Number of attempts: 1 Airway Equipment and Method: Stylet Placement Confirmation: ETT inserted through vocal cords under direct vision, positive ETCO2, CO2 detector and breath sounds checked- equal and bilateral Secured at: 22 cm Tube secured with: Tape Dental Injury: Teeth and Oropharynx as per pre-operative assessment  Comments: Atraumatic intubation. Anterior view. Lips and teeth remain in preoperative condition.

## 2024-02-02 NOTE — Anesthesia Preprocedure Evaluation (Signed)
 Anesthesia Evaluation  Patient identified by MRN, date of birth, ID band Patient awake    Reviewed: Allergy & Precautions, H&P , NPO status , Patient's Chart, lab work & pertinent test results, reviewed documented beta blocker date and time   Airway Mallampati: II  TM Distance: >3 FB Neck ROM: full    Dental no notable dental hx.    Pulmonary neg pulmonary ROS, former smoker   Pulmonary exam normal breath sounds clear to auscultation       Cardiovascular Exercise Tolerance: Good hypertension, negative cardio ROS  Rhythm:regular Rate:Normal + Systolic murmurs    Neuro/Psych  PSYCHIATRIC DISORDERS Anxiety Depression    negative neurological ROS     GI/Hepatic Neg liver ROS, PUD,GERD  ,,  Endo/Other  negative endocrine ROS    Renal/GU Renal disease  negative genitourinary   Musculoskeletal   Abdominal   Peds  Hematology negative hematology ROS (+)   Anesthesia Other Findings   Reproductive/Obstetrics negative OB ROS                             Anesthesia Physical Anesthesia Plan  ASA: 2  Anesthesia Plan: General and General ETT   Post-op Pain Management:    Induction:   PONV Risk Score and Plan: Ondansetron  and Scopolamine patch - Pre-op  Airway Management Planned:   Additional Equipment:   Intra-op Plan:   Post-operative Plan:   Informed Consent: I have reviewed the patients History and Physical, chart, labs and discussed the procedure including the risks, benefits and alternatives for the proposed anesthesia with the patient or authorized representative who has indicated his/her understanding and acceptance.     Dental Advisory Given  Plan Discussed with: CRNA  Anesthesia Plan Comments:        Anesthesia Quick Evaluation

## 2024-02-02 NOTE — Op Note (Signed)
 Pre Op Dx:   Heavy/abnormal uterine bleeding Post Op Dx:   same  Procedure:   Robotic Assisted Total Laparoscopic Hysterectomy and Bilateral Salpingectomy   Surgeon:  Dr. Keene Pastures Anesthesia:  general   EBL:  30cc  IVF:  1000cc UOP:  100cc   Drains:  Foley catheter Specimen removed:  uterus and bilateral fallopian tubes Findings:  8cm anteverted uterus normal tubes and ovaries.  Left Filshie clip noted and removed.  Complications: None  Description of procedure:  After informed consent the patient was taken to the operating room and placed in dorsal supine position where general endotracheal anesthesia was administered and found to be adequate.  She was placed in dorsal lithotomy position with her arms tucked.  She was prepped and draped in the usual sterile fashion.  A timeout was called and the procedure confirmed.  A Vcare uterine manipulator with the cup and a Foley catheter were placed.   An incision was made in the supraumbilical area and the Veress needle was inserted into the abdominal cavity without difficulty. Proper placement was confirmed using the saline drop test and opening pressure was 4 mmHg. A pneumoperitoneum was obtained. The laparoscopic trocar and the laparoscope were placed under direct visualization.  Three additional 7mm ports were placed on either side of the umbilicus and an 8mm port was placed in the left upper quadrant under direct visualization.  The patient was placed in Trendelenburg position and the [employer] robotic device was docked.  Next, attention was turned to the console where the hysterectomy was performed.  The right fallopian tube was divided at the mesosalpinx and the uteroovarian anastamosis was divided and the right round ligament was divided.  This process was repeated on the contralateral side.  The anterior leaflet of the broad ligament was divided to create a bladder flap. A small anterior and posterior colpotomy incision was made to mark  the cup. The uterine artery and vein were skeletonized and desiccated superior to the cup.  This process was repeated on the contralateral side.  The colpotomy was completed along the ridge of the cup and the uterus was passed off the field. The vaginal occluder was placed in the vagina to maintain pneumoperitoneum.  The vaginal cuff was then closed with 0 Stratifix suture in a double layer fashion. Right ureter was easily visualized with peristalsis, the left ureter was seen, but more difficult due to bowel adhesions to the left side wall.  Hemostasis confirmed. The left ovary was noted to be drooping near the cuff and was plicated with a single stitch of 2-0 vicryl.  Surgicel powder was placed on the vaginal cuff and adnexa bilaterally.  The Da Vinci robotic device was undocked.  Under direct visualization TAP block was completed under direct visualization using 10cc of 0.25% marcaine  in each of four locations.  Airseal was deflated and trocars were removed.The skin was closed with 4-0 Vicryl in subcuticular fashion with skin glue placed atop each port site.    The patient was returned to dorsal supine position, awakened and extubated in the OR having appeared to tolerate the procedure well.  All sponge, needle, and instrument counts were correct x 2 at the end of the case.  Pt tolerated procedure well and was taken to recovery in stable condition.   Chistine Dematteo, DO Attending Obstetrician & Gynecologist, [facility] for [employer], [facility]

## 2024-02-03 ENCOUNTER — Encounter (HOSPITAL_COMMUNITY): Payer: Self-pay | Admitting: Obstetrics & Gynecology

## 2024-02-03 ENCOUNTER — Other Ambulatory Visit: Payer: Self-pay | Admitting: Nurse Practitioner

## 2024-02-03 DIAGNOSIS — F419 Anxiety disorder, unspecified: Secondary | ICD-10-CM

## 2024-02-03 LAB — SURGICAL PATHOLOGY

## 2024-02-03 NOTE — Anesthesia Postprocedure Evaluation (Signed)
 Anesthesia Post Note  Patient: Lindsey Sampson  Procedure(s) Performed: HYSTERECTOMY, TOTAL, ROBOT-ASSISTED, LAPAROSCOPIC, WITH BILATERAL SALPINGECTOMY (Bilateral: Abdomen)  Patient location during evaluation: Phase II Anesthesia Type: General Level of consciousness: awake Pain management: pain level controlled Vital Signs Assessment: post-procedure vital signs reviewed and stable Respiratory status: spontaneous breathing and respiratory function stable Cardiovascular status: blood pressure returned to baseline and stable Postop Assessment: no headache and no apparent nausea or vomiting Anesthetic complications: no Comments: Late entry   No notable events documented.   Last Vitals:  Vitals:   02/02/24 1145 02/02/24 1200  BP:    Pulse: (!) 102 85  Resp: (!) 22 18  Temp:  36.4 C  SpO2: 100% 100%    Last Pain:  Vitals:   02/02/24 1200  TempSrc: Oral  PainSc: 8                  Coretha Dew

## 2024-02-06 ENCOUNTER — Other Ambulatory Visit: Payer: Self-pay | Admitting: Nurse Practitioner

## 2024-02-06 DIAGNOSIS — F419 Anxiety disorder, unspecified: Secondary | ICD-10-CM

## 2024-02-07 ENCOUNTER — Other Ambulatory Visit: Payer: Self-pay | Admitting: Obstetrics & Gynecology

## 2024-02-07 MED ORDER — GABAPENTIN 300 MG PO CAPS: 300.0000 mg | ORAL_CAPSULE | Freq: Three times a day (TID) | ORAL | 0 refills | Status: DC

## 2024-02-07 MED ORDER — OXYCODONE HCL 5 MG PO TABS: 5.0000 mg | ORAL_TABLET | Freq: Four times a day (QID) | ORAL | 0 refills | Status: AC | PRN

## 2024-02-08 ENCOUNTER — Other Ambulatory Visit: Payer: Self-pay | Admitting: Nurse Practitioner

## 2024-02-08 DIAGNOSIS — F419 Anxiety disorder, unspecified: Secondary | ICD-10-CM

## 2024-02-10 ENCOUNTER — Ambulatory Visit: Admitting: Obstetrics & Gynecology

## 2024-02-10 ENCOUNTER — Encounter: Payer: Self-pay | Admitting: Obstetrics & Gynecology

## 2024-02-10 ENCOUNTER — Encounter: Payer: Self-pay | Admitting: Nurse Practitioner

## 2024-02-10 VITALS — BP 118/77 | HR 85 | Ht 64.0 in | Wt 161.8 lb

## 2024-02-10 DIAGNOSIS — L309 Dermatitis, unspecified: Secondary | ICD-10-CM

## 2024-02-10 DIAGNOSIS — Z4889 Encounter for other specified surgical aftercare: Secondary | ICD-10-CM

## 2024-02-10 MED ORDER — CLOTRIMAZOLE-BETAMETHASONE 1-0.05 % EX CREA: TOPICAL_CREAM | CUTANEOUS | 0 refills | Status: DC

## 2024-02-10 NOTE — Progress Notes (Signed)
    PostOp Visit Note  Lindsey Sampson is a 43 y.o. G2P2 female who presents for a postoperative visit. She is 1 week postop following a RAH, BS completed on 5/7   Today she notes that she is doing well- may take the oxycodone  only on occasion Denies fever or chills.  Tolerating gen diet.  +Flatus, Regular BMs.  Denies vaginal bleeding, discharge or irritation.  She has noted itching and redness around all of her incision sites.  She is using ice to help.  She otherwise reports no acute complaints or concerns  Review of Systems Pertinent items are noted in HPI.    Objective:  BP 118/77 (BP Location: Right Arm, Patient Position: Sitting, Cuff Size: Normal)   Pulse 85   Ht 5\' 4"  (1.626 m)   Wt 161 lb 12.8 oz (73.4 kg)   BMI 27.77 kg/m    Physical Examination:  GENERAL ASSESSMENT: well developed and well nourished SKIN: warm and dry CHEST: normal air exchange, respiratory effort normal with no retractions HEART: regular rate and rhythm ABDOMEN: soft, non-distended, +BS INCISION: healing appropriately surrounding all incision sites- slightly raised pink patches notes around all incisions EXTREMITY: no edema no calf tenderness bilaterally PSYCH: mood appropriate, normal affect       Assessment:    Dermatitis 1wk postop RAH, BS   Plan:   -Lotrisone sent in to place on incisional areas - Reviewed pain management - Encouraged pelvic rest for an additional 7 weeks as well as avoiding heavy lifting bending and squatting - Questions and concerns were addressed patient was agreeable to above - Follow-up in 7 weeks for final postop visit  Lindsey Quintanilla, DO Attending Obstetrician & Gynecologist, [facility] for [employer], [facility]

## 2024-02-11 ENCOUNTER — Telehealth: Payer: Self-pay

## 2024-02-11 ENCOUNTER — Other Ambulatory Visit (HOSPITAL_COMMUNITY): Payer: Self-pay

## 2024-02-11 ENCOUNTER — Encounter: Payer: Self-pay | Admitting: Obstetrics & Gynecology

## 2024-02-11 ENCOUNTER — Other Ambulatory Visit: Payer: Self-pay | Admitting: Nurse Practitioner

## 2024-02-11 DIAGNOSIS — Z6830 Body mass index (BMI) 30.0-30.9, adult: Secondary | ICD-10-CM

## 2024-02-11 MED ORDER — SEMAGLUTIDE-WEIGHT MANAGEMENT 1 MG/0.5ML ~~LOC~~ SOAJ: 1.0000 mg | SUBCUTANEOUS | 0 refills | Status: AC

## 2024-02-11 MED ORDER — SEMAGLUTIDE-WEIGHT MANAGEMENT 1.7 MG/0.75ML ~~LOC~~ SOAJ: 1.7000 mg | SUBCUTANEOUS | 3 refills | Status: AC

## 2024-02-11 MED ORDER — SEMAGLUTIDE-WEIGHT MANAGEMENT 0.25 MG/0.5ML ~~LOC~~ SOAJ: 0.2500 mg | SUBCUTANEOUS | 0 refills | Status: AC

## 2024-02-11 MED ORDER — SEMAGLUTIDE-WEIGHT MANAGEMENT 0.5 MG/0.5ML ~~LOC~~ SOAJ: 0.5000 mg | SUBCUTANEOUS | 0 refills | Status: AC

## 2024-02-11 NOTE — Telephone Encounter (Signed)
 Pharmacy Patient Advocate Encounter   Received notification from Patient Pharmacy that prior authorization for Wegovy  is required/requested.   Insurance verification completed.   The patient is insured through [employer] .   Per test claim: Refill too soon. Last filled 01/23/24. PA not needed at this time

## 2024-02-14 ENCOUNTER — Encounter: Payer: Self-pay | Admitting: Obstetrics & Gynecology

## 2024-02-15 ENCOUNTER — Other Ambulatory Visit (HOSPITAL_COMMUNITY): Payer: Self-pay

## 2024-02-22 ENCOUNTER — Other Ambulatory Visit (HOSPITAL_COMMUNITY): Payer: Self-pay

## 2024-02-22 NOTE — Telephone Encounter (Signed)
 Pharmacy Patient Advocate Encounter  Received notification from [employer] that Prior Authorization for Wegovy  0.5 has been APPROVED from 02/22/24 to 02/21/25. Ran test claim, Copay is $4.00. This test claim was processed through [facility]- copay amounts may vary at other pharmacies due to pharmacy/plan contracts, or as the patient moves through the different stages of their insurance plan.   PA #/Case ID/Reference #: [id number]

## 2024-02-22 NOTE — Telephone Encounter (Signed)
 Pharmacy Patient Advocate Encounter   Received notification from Pt Calls Messages that prior authorization for Wegovy  0.5 is due for renewal.   Insurance verification completed.   The patient is insured through [facility].  Action: PA required; PA submitted to above mentioned insurance via CoverMyMeds Key/confirmation #/EOC [id number] Status is pending

## 2024-02-24 ENCOUNTER — Encounter: Payer: Self-pay | Admitting: Nurse Practitioner

## 2024-02-28 ENCOUNTER — Encounter: Payer: Self-pay | Admitting: Obstetrics & Gynecology

## 2024-03-07 ENCOUNTER — Other Ambulatory Visit: Payer: Self-pay | Admitting: Nurse Practitioner

## 2024-03-07 DIAGNOSIS — F331 Major depressive disorder, recurrent, moderate: Secondary | ICD-10-CM

## 2024-03-23 ENCOUNTER — Other Ambulatory Visit: Payer: Self-pay | Admitting: Nurse Practitioner

## 2024-03-23 DIAGNOSIS — F419 Anxiety disorder, unspecified: Secondary | ICD-10-CM

## 2024-03-30 ENCOUNTER — Encounter: Payer: Self-pay | Admitting: Obstetrics & Gynecology

## 2024-03-30 ENCOUNTER — Ambulatory Visit: Admitting: Obstetrics & Gynecology

## 2024-03-30 VITALS — BP 108/75 | HR 76 | Ht 64.0 in | Wt 155.6 lb

## 2024-03-30 DIAGNOSIS — Z4889 Encounter for other specified surgical aftercare: Secondary | ICD-10-CM

## 2024-03-30 NOTE — Progress Notes (Signed)
    PostOp Visit Note  Lindsey Sampson is a 43 y.o. G2P2 female who presents for a postoperative visit. She is 8 weeks postop following a RAH, BS completed on 02/02/24   Today she notes that she takes tylenol  every now and then.  Denies fever or chills.  Tolerating gen diet.  +Flatus, Regular BMs.  Denies vaginal bleeding.  Overall doing well and reports no acute complaints   Review of Systems Pertinent items are noted in HPI.    Objective:  BP 108/75 (BP Location: Left Arm, Patient Position: Sitting, Cuff Size: Normal)   Pulse 76   Ht 5' 4 (1.626 m)   Wt 155 lb 9.6 oz (70.6 kg)   LMP 12/15/2023 (Approximate)   BMI 26.71 kg/m    Physical Examination:  GENERAL ASSESSMENT: well developed and well nourished SKIN: normal color, no lesions CHEST: normal air exchange, respiratory effort normal with no retractions HEART: regular rate and rhythm ABDOMEN: soft, non-distended, incisions well healed GU: normal external genitalia, normal vaginal mucosa, vaginal cuff appears well healed- no abnormalities seen.  On bimanual exam well healed- no defects noted.  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

## 2024-05-04 ENCOUNTER — Other Ambulatory Visit: Payer: Self-pay | Admitting: Family

## 2024-05-04 DIAGNOSIS — F419 Anxiety disorder, unspecified: Secondary | ICD-10-CM

## 2024-05-10 ENCOUNTER — Other Ambulatory Visit: Payer: Self-pay | Admitting: Family

## 2024-05-10 ENCOUNTER — Encounter: Payer: Self-pay | Admitting: Nurse Practitioner

## 2024-05-10 DIAGNOSIS — L918 Other hypertrophic disorders of the skin: Secondary | ICD-10-CM

## 2024-05-10 DIAGNOSIS — D229 Melanocytic nevi, unspecified: Secondary | ICD-10-CM

## 2024-05-10 DIAGNOSIS — F419 Anxiety disorder, unspecified: Secondary | ICD-10-CM

## 2024-05-10 MED ORDER — ALPRAZOLAM 0.5 MG PO TABS: 0.5000 mg | ORAL_TABLET | Freq: Two times a day (BID) | ORAL | 0 refills | Status: DC | PRN

## 2024-05-12 ENCOUNTER — Other Ambulatory Visit: Payer: Self-pay | Admitting: Family

## 2024-05-18 NOTE — Addendum Note (Signed)
 Addended by: LEAR, Xochitl Egle P on: 05/18/2024 04:03 PM   Modules accepted: Orders

## 2024-06-15 ENCOUNTER — Other Ambulatory Visit: Payer: Self-pay | Admitting: Family

## 2024-06-15 DIAGNOSIS — F419 Anxiety disorder, unspecified: Secondary | ICD-10-CM

## 2024-07-06 ENCOUNTER — Ambulatory Visit
Admission: RE | Admit: 2024-07-06 | Discharge: 2024-07-06 | Disposition: A | Discharge status: Home / Self Care | Attending: Emergency Medicine | Admitting: Emergency Medicine

## 2024-07-06 ENCOUNTER — Other Ambulatory Visit: Payer: Self-pay

## 2024-07-06 VITALS — BP 109/74 | HR 76 | Temp 98.8°F | Resp 20

## 2024-07-06 DIAGNOSIS — R1084 Generalized abdominal pain: Secondary | ICD-10-CM | POA: Diagnosis not present

## 2024-07-06 DIAGNOSIS — R197 Diarrhea, unspecified: Secondary | ICD-10-CM

## 2024-07-06 DIAGNOSIS — R110 Nausea: Secondary | ICD-10-CM

## 2024-07-06 MED ORDER — ONDANSETRON 4 MG PO TBDP
4.0000 mg | ORAL_TABLET | Freq: Three times a day (TID) | ORAL | 0 refills | Status: DC | PRN
Start: 2024-07-06 — End: 2024-07-20

## 2024-07-06 MED ORDER — LIDOCAINE VISCOUS HCL 2 % MT SOLN
15.0000 mL | Freq: Once | OROMUCOSAL | Status: AC
  Administered 2024-07-06: 15 mL via OROMUCOSAL

## 2024-07-06 MED ORDER — KETOROLAC TROMETHAMINE 30 MG/ML IJ SOLN
30.0000 mg | Freq: Once | INTRAMUSCULAR | Status: AC
  Administered 2024-07-06: 30 mg via INTRAMUSCULAR

## 2024-07-06 MED ORDER — ONDANSETRON 4 MG PO TBDP
4.0000 mg | ORAL_TABLET | Freq: Once | ORAL | Status: AC
  Administered 2024-07-06: 4 mg via ORAL

## 2024-07-06 MED ORDER — ALUM & MAG HYDROXIDE-SIMETH 200-200-20 MG/5ML PO SUSP
30.0000 mL | Freq: Once | ORAL | Status: AC
  Administered 2024-07-06: 30 mL via ORAL

## 2024-07-06 NOTE — ED Provider Notes (Signed)
 [facility]    CSN: [id number] Arrival date & time: 07/06/24  1427      History   Chief Complaint Chief Complaint  Patient presents with   Abdominal Pain    Upset stomach and bad stomach pain for two days now - Entered by patient    HPI Lindsey Sampson is a 43 y.o. female.   Patient presents to clinic over concern of nausea, generalized abdominal pain and diarrhea for the past 2 days.  Did notice that prior to onset of symptoms she gave herself another Wegovy  injection, had a similar reaction a month ago when she increased her dose to her current dosage.  She also had some milk prior to the onset.  No recent sick contacts.  Has had nausea but no vomiting.   Did try over-the-counter nausea medicine from [employer] without much improvement.  Also tried Imodium and Gas-X.  Has not had fevers.  Reports severe generalized abdominal pain.  Is concerned it may be her gallbladder.  Has not had recent alcohol or fatty food intake.  Did not have much to eat yesterday, few crackers.  Denies dysuria, urgency or frequency.  Does not have fevers.  Hysterectomy earlier this year.   The history is provided by the patient and medical records.  Abdominal Pain   Past Medical History:  Diagnosis Date   Anxiety    Benign paroxysmal positional vertigo 05/08/2022   Colitis    Cyst of spleen    Depression    Ovarian cyst    Renal disorder    renal cyst,     Patient Active Problem List   Diagnosis Date Noted   Menorrhagia with regular cycle 09/28/2023   Pregnancy examination or test, negative result 09/28/2023   Encounter for general adult medical examination with abnormal findings 09/17/2023   Diabetes mellitus screening 09/17/2023   Abnormal uterine bleeding 09/17/2023   Encounter for screening mammogram for malignant neoplasm of breast 09/17/2023   Hyperlipidemia 05/21/2023   Elevated blood pressure reading 09/04/2022   Benign paroxysmal positional vertigo 05/08/2022   Class  1 obesity without serious comorbidity with body mass index (BMI) of 30.0 to 30.9 in adult 04/02/2022   Symptomatic mammary hypertrophy 03/13/2022   Neck pain 03/13/2022   Abdominal pain 03/02/2022   Anxiety 03/02/2022   Dysmenorrhea 03/02/2022   GERD (gastroesophageal reflux disease) 03/02/2022   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/02/2022   Menorrhagia with irregular cycle 03/02/2022   Bilateral serous otitis media 03/02/2022   Fatigue 03/02/2022   Screening for cardiovascular condition 03/02/2022   Screening for diabetes mellitus 03/02/2022   Moderate episode of recurrent major depressive disorder (HCC) 03/02/2022   Sacroiliac joint pain 01/09/2022   Disorder of right sacroiliac joint 01/05/2022   Back pain 12/04/2021   Open displaced fracture of proximal phalanx of right great toe 02/20/2020   Great toe pain, right 09/07/2019   Open displaced fracture of proximal phalanx of great toe 09/06/2019   Peptic ulcer disease 07/26/2019   Early satiety 07/26/2019   Liver lesion 07/26/2019   H/O tubal ligation 10/14/2018   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    Past Surgical History:  Procedure Laterality Date   ABDOMINAL HYSTERECTOMY     BACK SURGERY     BIOPSY  11/10/2019   Procedure: BIOPSY;  Surgeon: Harvey Margo LITTIE, MD;  Location: [facility];  Service: Endoscopy;;   BREAST REDUCTION SURGERY Bilateral 12/03/2022   Procedure: BREAST REDUCTION WITH LIPOSUCTION;  Surgeon: Lowery Estefana RAMAN,  DO;  Location: [facility];  Service: Plastics;  Laterality: Bilateral;   CESAREAN SECTION     CESAREAN SECTION  2006 and 2010   cyst removed from ovary Right    ESOPHAGOGASTRODUODENOSCOPY N/A 11/10/2019   Procedure: ESOPHAGOGASTRODUODENOSCOPY (EGD);  Surgeon: Harvey Margo CROME, MD;  Location: [facility];  Service: Endoscopy;  Laterality: N/A;  12:00pm   LUMBAR LAMINECTOMY/DECOMPRESSION MICRODISCECTOMY Left 05/14/2016   Procedure: LUMBAR DECOMPRESSION/MICRODISCECTOMY  LEFT LUMBAR FOUR-FIVE;  Surgeon: Donaciano Sprang, MD;  Location: [facility];  Service: Orthopedics;  Laterality: Left;   ORIF TOE FRACTURE Right 09/07/2019   Procedure: IRRIGATION AND DEBRIDEMENT AND OPEN REDUCTION INTERNAL FIXATION (ORIF) Proximal phalanx FRACTURE;  Surgeon: Kendal Franky SQUIBB, MD;  Location: [facility];  Service: Orthopedics;  Laterality: Right;   ORIF TOE FRACTURE Right 04/03/2020   Procedure: REPAIR OF RIGHT GREAT TOE NONUNION  WITH BONE GRAFTING RIGHT HIP;  Surgeon: Kendal Franky SQUIBB, MD;  Location: [facility];  Service: Orthopedics;  Laterality: Right;   REDUCTION MAMMAPLASTY Bilateral 12/03/2022   ROBOTIC ASSISTED TOTAL HYSTERECTOMY WITH BILATERAL SALPINGO OOPHERECTOMY Bilateral 02/02/2024   Procedure: HYSTERECTOMY, TOTAL, ROBOT-ASSISTED, LAPAROSCOPIC, WITH BILATERAL SALPINGECTOMY;  Surgeon: Marilynn Nest, DO;  Location: [facility];  Service: Gynecology;  Laterality: Bilateral;   TUBAL LIGATION      OB History     Gravida  2   Para  2   Term      Preterm      AB      Living  2      SAB      IAB      Ectopic      Multiple  0   Live Births  0            Home Medications    Prior to Admission medications   Medication Sig Start Date End Date Taking? Authorizing Provider  ondansetron  (ZOFRAN -ODT) 4 MG disintegrating tablet Take 1 tablet (4 mg total) by mouth every 8 (eight) hours as needed for nausea or vomiting. 07/06/24  Yes Thi Klich  N, FNP  Semaglutide -Weight Management 1.7 MG/0.75ML SOAJ Inject 1.7 mg into the skin once a week. 05/08/24 08/28/24 Yes Elnor Lauraine BRAVO, NP  acetaminophen  (TYLENOL ) 500 MG tablet Take 1,000 mg by mouth every 6 (six) hours as needed for mild pain (pain score 1-3).    [provider]  ALPRAZolam  (XANAX ) 0.5 MG tablet Take 1 tablet (0.5 mg total) by mouth 2 (two) times daily as needed. for anxiety 05/10/24   Webb, Padonda B, FNP  buPROPion  (WELLBUTRIN  XL) 150 MG 24 hr tablet Take 1 tablet (150 mg total) by mouth daily. 01/13/24    Elnor Lauraine BRAVO, NP  escitalopram  (LEXAPRO ) 10 MG tablet Take 1 tablet (10 mg total) by mouth daily. 02/03/24   Elnor Lauraine BRAVO, NP  clonazePAM  (KLONOPIN ) 0.5 MG tablet Take 0.5 mg by mouth 3 (three) times daily as needed. For anxiety/sleep. 03/12/16 03/29/19  [provider]    Family History Family History  Problem Relation Age of Onset   Diabetes Mother    Hypertension Mother    High Cholesterol Mother    Hypertension Father    Prostate cancer Father    High Cholesterol Father    Colon polyps Maternal Grandmother    Colon cancer Neg Hx    Breast cancer Neg Hx    BRCA 1/2 Neg Hx     Social History Social History   Tobacco Use   Smoking status: Former  Current packs/day: 0.00    Types: Cigarettes    Quit date: 04/27/2022    Years since quitting: 2.1   Smokeless tobacco: Never   Tobacco comments:    trying to quit   Vaping Use   Vaping status: Former  Substance Use Topics   Alcohol use: Yes    Comment: rare   Drug use: No     Allergies   Patient has no known allergies.   Review of Systems Review of Systems  Per HPI  Physical Exam Triage Vital Signs ED Triage Vitals  Encounter Vitals Group     BP 07/06/24 1433 109/74     Girls Systolic BP Percentile --      Girls Diastolic BP Percentile --      Boys Systolic BP Percentile --      Boys Diastolic BP Percentile --      Pulse Rate 07/06/24 1433 76     Resp 07/06/24 1433 20     Temp 07/06/24 1433 98.8 F (37.1 C)     Temp Source 07/06/24 1433 Oral     SpO2 07/06/24 1433 98 %     Weight --      Height --      Head Circumference --      Peak Flow --      Pain Score 07/06/24 1436 10     Pain Loc --      Pain Education --      Exclude from Growth Chart --    No data found.  Updated Vital Signs BP 109/74 (BP Location: Right Arm)   Pulse 76   Temp 98.8 F (37.1 C) (Oral)   Resp 20   LMP 12/15/2023 (Approximate)   SpO2 98%   Visual Acuity Right Eye Distance:   Left Eye Distance:   Bilateral  Distance:    Right Eye Near:   Left Eye Near:    Bilateral Near:     Physical Exam Vitals and nursing note reviewed.  Constitutional:      Appearance: She is well-developed.  HENT:     Head: Normocephalic and atraumatic.     Mouth/Throat:     Mouth: Mucous membranes are moist.  Eyes:     General: No scleral icterus. Cardiovascular:     Rate and Rhythm: Normal rate.  Pulmonary:     Effort: Pulmonary effort is normal. No respiratory distress.  Abdominal:     General: Abdomen is flat. Bowel sounds are normal.     Palpations: Abdomen is soft.     Tenderness: There is generalized abdominal tenderness. There is no guarding or rebound.  Skin:    General: Skin is warm and dry.  Neurological:     General: No focal deficit present.     Mental Status: She is alert and oriented to person, place, and time.  Psychiatric:        Mood and Affect: Mood normal.        Behavior: Behavior normal.      UC Treatments / Results  Labs (all labs ordered are listed, but only abnormal results are displayed) Labs Reviewed  COMPREHENSIVE METABOLIC PANEL WITH GFR  CBC WITH DIFFERENTIAL/PLATELET  LIPASE    EKG   Radiology No results found.  Procedures Procedures (including critical care time)  Medications Ordered in UC Medications  ketorolac  (TORADOL ) 30 MG/ML injection 30 mg (has no administration in time range)  ondansetron  (ZOFRAN -ODT) disintegrating tablet 4 mg (4 mg Oral Given 07/06/24 1454)  alum &  mag hydroxide-simeth (MAALOX/MYLANTA) 200-200-20 MG/5ML suspension 30 mL (30 mLs Oral Given 07/06/24 1454)  lidocaine  (XYLOCAINE ) 2 % viscous mouth solution 15 mL (15 mLs Mouth/Throat Given 07/06/24 1454)    Initial Impression / Assessment and Plan / UC Course  I have reviewed the triage vital signs and the nursing notes.  Pertinent labs & imaging results that were available during my care of the patient were reviewed by me and considered in my medical decision making (see chart for  details).  Vitals and triage reviewed, patient is hemodynamically stable.   Abdomen is soft with generalized tenderness, without rebound or guarding, does not appear to be suffering from acute abdomen at this time.  Zofran  given in clinic as well as GI cocktail.  Patient reports nausea has worsened, willing to try ice chips.  Discussed limitations of advanced imaging and advanced evaluation in urgent care.  Offered patient to head to emergency department due to 10 out of 10 nature and severe pain, patient would like to withhold from transport to ED at this time.  Will trial IM Toradol  for pain and check basic labs.  Strict emergency precautions given if symptoms continue or evolve.  Could be related to gallbladder with Wegovy , could be viral gastroenteritis, or multiple other causes.  Plan of care, follow-up care return precautions given, no questions at this time.    Final Clinical Impressions(s) / UC Diagnoses   Final diagnoses:  Generalized abdominal pain     Discharge Instructions      Use the nausea medicine every 8 hours as needed.  Try to chew on some ice chips or take sips of water .  If this goes well you can progress to clear liquid diet such as Jell-O or broth.  Starting tomorrow you can progress to a bland diet such as bananas, rice, toast and applesauce.  We are checking some labs and we will contact you if urgent follow-up is needed.  Seek immediate care at the nearest emergency department if your pain persists, worsens, or changes in any way.     ED Prescriptions     Medication Sig Dispense Auth. Provider   ondansetron  (ZOFRAN -ODT) 4 MG disintegrating tablet Take 1 tablet (4 mg total) by mouth every 8 (eight) hours as needed for nausea or vomiting. 20 tablet Dreama, Treon Kehl  N, FNP      PDMP not reviewed this encounter.   Dreama, Treyton Slimp  N, FNP 07/06/24 1524

## 2024-07-06 NOTE — ED Triage Notes (Signed)
 Pt reports nausea,diarrhea,abd pain x2 days. Denies any known fevers. Pt reports has taken immodium, gas-x, otc nausea medication with minimal change in symptoms.

## 2024-07-06 NOTE — Discharge Instructions (Signed)
 Use the nausea medicine every 8 hours as needed.  Try to chew on some ice chips or take sips of water .  If this goes well you can progress to clear liquid diet such as Jell-O or broth.  Starting tomorrow you can progress to a bland diet such as bananas, rice, toast and applesauce.  We are checking some labs and we will contact you if urgent follow-up is needed.  Seek immediate care at the nearest emergency department if your pain persists, worsens, or changes in any way.

## 2024-07-07 ENCOUNTER — Other Ambulatory Visit: Payer: Self-pay | Admitting: Nurse Practitioner

## 2024-07-07 ENCOUNTER — Ambulatory Visit (HOSPITAL_COMMUNITY): Payer: Self-pay

## 2024-07-07 DIAGNOSIS — H8110 Benign paroxysmal vertigo, unspecified ear: Secondary | ICD-10-CM

## 2024-07-07 LAB — COMPREHENSIVE METABOLIC PANEL WITH GFR
ALT: 11 IU/L (ref 0–32)
AST: 9 IU/L (ref 0–40)
Albumin: 5 g/dL — ABNORMAL HIGH (ref 3.9–4.9)
Alkaline Phosphatase: 57 IU/L (ref 41–116)
BUN/Creatinine Ratio: 16 (ref 9–23)
BUN: 13 mg/dL (ref 6–24)
Bilirubin Total: 0.4 mg/dL (ref 0.0–1.2)
CO2: 17 mmol/L — ABNORMAL LOW (ref 20–29)
Calcium: 9.7 mg/dL (ref 8.7–10.2)
Chloride: 103 mmol/L (ref 96–106)
Creatinine, Ser: 0.83 mg/dL (ref 0.57–1.00)
Globulin, Total: 2.5 g/dL (ref 1.5–4.5)
Glucose: 90 mg/dL (ref 70–99)
Potassium: 4.6 mmol/L (ref 3.5–5.2)
Sodium: 136 mmol/L (ref 134–144)
Total Protein: 7.5 g/dL (ref 6.0–8.5)
eGFR: 89 mL/min/1.73 (ref >59)

## 2024-07-07 LAB — CBC WITH DIFFERENTIAL/PLATELET
Basophils Absolute: 0 x10E3/uL (ref 0.0–0.2)
Basos: 0 %
EOS (ABSOLUTE): 0.2 x10E3/uL (ref 0.0–0.4)
Eos: 2 %
Hematocrit: 40.9 % (ref 34.0–46.6)
Hemoglobin: 13.9 g/dL (ref 11.1–15.9)
Immature Grans (Abs): 0 x10E3/uL (ref 0.0–0.1)
Immature Granulocytes: 0 %
Lymphocytes Absolute: 2.1 x10E3/uL (ref 0.7–3.1)
Lymphs: 29 %
MCH: 33 pg (ref 26.6–33.0)
MCHC: 34 g/dL (ref 31.5–35.7)
MCV: 97 fL (ref 79–97)
Monocytes Absolute: 0.5 x10E3/uL (ref 0.1–0.9)
Monocytes: 6 %
Neutrophils Absolute: 4.5 x10E3/uL (ref 1.4–7.0)
Neutrophils: 63 %
Platelets: 288 x10E3/uL (ref 150–450)
RBC: 4.21 x10E6/uL (ref 3.77–5.28)
RDW: 12.8 % (ref 11.7–15.4)
WBC: 7.3 x10E3/uL (ref 3.4–10.8)

## 2024-07-07 LAB — LIPASE: Lipase: 30 U/L (ref 14–72)

## 2024-07-16 ENCOUNTER — Encounter: Payer: Self-pay | Admitting: Nurse Practitioner

## 2024-07-18 ENCOUNTER — Telehealth: Payer: Self-pay

## 2024-07-18 ENCOUNTER — Other Ambulatory Visit (HOSPITAL_COMMUNITY): Payer: Self-pay

## 2024-07-18 NOTE — Telephone Encounter (Signed)
 Pharmacy Patient Advocate Encounter   Received notification from Onbase that prior authorization for Wegovy  1.7 mg/0.75 ml auto injectors is required/requested.   Insurance verification completed.   The patient is insured through [employer].   Per test claim: Effective October 1st, Medicaid will discontinue coverage of GLP1 medications for weight loss (such as Wegovy  and Zepbound ), unless the patient has a documented history of a heart attack or stroke. Zepbound  will continue to be covered only for patients with moderate to severe sleep apnea (AHI 15-30) and a BMI greater than 40. Because of this change, the prior authorization team will not be submitting new PA requests for GLP1 medications prescribed for weight loss, as patients will be unable to continue therapy under Medicaid coverage.

## 2024-07-19 ENCOUNTER — Other Ambulatory Visit: Payer: Self-pay | Admitting: Nurse Practitioner

## 2024-07-19 DIAGNOSIS — F419 Anxiety disorder, unspecified: Secondary | ICD-10-CM

## 2024-07-19 MED ORDER — ALPRAZOLAM 0.5 MG PO TABS: 0.5000 mg | ORAL_TABLET | Freq: Two times a day (BID) | ORAL | 0 refills | Status: DC | PRN

## 2024-07-20 ENCOUNTER — Other Ambulatory Visit: Payer: Self-pay

## 2024-07-20 MED ORDER — ONDANSETRON 4 MG PO TBDP: 4.0000 mg | ORAL_TABLET | Freq: Three times a day (TID) | ORAL | 0 refills | Status: DC | PRN

## 2024-07-25 ENCOUNTER — Other Ambulatory Visit (HOSPITAL_COMMUNITY): Payer: Self-pay

## 2024-07-28 ENCOUNTER — Other Ambulatory Visit: Payer: Self-pay | Admitting: Nurse Practitioner

## 2024-07-28 DIAGNOSIS — F419 Anxiety disorder, unspecified: Secondary | ICD-10-CM

## 2024-09-14 ENCOUNTER — Ambulatory Visit: Admitting: Nurse Practitioner

## 2024-09-14 VITALS — BP 102/64 | HR 68 | Temp 97.8°F | Ht 64.0 in | Wt 150.0 lb

## 2024-09-14 DIAGNOSIS — F419 Anxiety disorder, unspecified: Secondary | ICD-10-CM | POA: Diagnosis not present

## 2024-09-14 DIAGNOSIS — R61 Generalized hyperhidrosis: Secondary | ICD-10-CM | POA: Diagnosis not present

## 2024-09-14 DIAGNOSIS — Z780 Asymptomatic menopausal state: Secondary | ICD-10-CM | POA: Diagnosis not present

## 2024-09-14 DIAGNOSIS — Z6830 Body mass index (BMI) 30.0-30.9, adult: Secondary | ICD-10-CM | POA: Diagnosis not present

## 2024-09-14 DIAGNOSIS — F331 Major depressive disorder, recurrent, moderate: Secondary | ICD-10-CM | POA: Diagnosis not present

## 2024-09-14 DIAGNOSIS — R110 Nausea: Secondary | ICD-10-CM | POA: Diagnosis not present

## 2024-09-14 DIAGNOSIS — E66811 Obesity, class 1: Secondary | ICD-10-CM

## 2024-09-14 MED ORDER — BUPROPION HCL ER (XL) 150 MG PO TB24: 150.0000 mg | ORAL_TABLET | Freq: Every day | ORAL | 3 refills | Status: AC

## 2024-09-14 MED ORDER — ESCITALOPRAM OXALATE 10 MG PO TABS: 10.0000 mg | ORAL_TABLET | Freq: Every day | ORAL | 2 refills | Status: AC

## 2024-09-14 MED ORDER — GABAPENTIN 100 MG PO CAPS: 100.0000 mg | ORAL_CAPSULE | Freq: Every day | ORAL | 3 refills | Status: AC

## 2024-09-14 MED ORDER — WEGOVY 0.25 MG/0.5ML ~~LOC~~ SOAJ: 0.2500 mg | SUBCUTANEOUS | 0 refills | Status: DC

## 2024-09-14 MED ORDER — WEGOVY 0.5 MG/0.5ML ~~LOC~~ SOAJ: 0.5000 mg | SUBCUTANEOUS | 3 refills | Status: DC

## 2024-09-14 MED ORDER — ONDANSETRON 4 MG PO TBDP: 4.0000 mg | ORAL_TABLET | Freq: Three times a day (TID) | ORAL | 0 refills | Status: AC | PRN

## 2024-09-14 MED ORDER — ALPRAZOLAM 0.5 MG PO TABS: 0.5000 mg | ORAL_TABLET | Freq: Two times a day (BID) | ORAL | 0 refills | Status: AC | PRN

## 2024-09-14 NOTE — Progress Notes (Signed)
 Established Patient Office Visit  Subjective   Patient ID: ARDYN FORGE, female    DOB: 03-29-1981  Age: 43 y.o. MRN: [id number]  Chief Complaint  Patient presents with   Medical Management of Chronic Issues    Having heat sweat some nights     Discussed the use of AI scribe software for clinical note transcription with the patient, who gave verbal consent to proceed.  History of Present Illness NELSIE DOMINO is a 43 year old female who presents with night sweats and weight management concerns.  Night sweats - Night sweats occur almost every night, worsened since hysterectomy. - Wakes up drenched in sweat but able to return to sleep after cooling down. - No increased daytime fatigue, easy bruising, or enlarged lymph nodes. No coughing, wheezing.   Weight fluctuation and appetite concerns - Class 1 obesity treated with wegovy . Starting BMI 31.41 and starting weight 183lbs. Currently BMI is 25.75 with weight 150lbs. Has noticed some weight regain since having to stop Wegovy  due to insurance no longer covering medication for weight loss.   Anxiety - Currently taking Wellbutrin  and Lexapro , which are effective. Requesting refills today. - Uses alprazolam  as needed.      Review of Systems  Constitutional:  Positive for diaphoresis. Negative for malaise/fatigue and weight loss.  Respiratory:  Negative for cough and shortness of breath.   Psychiatric/Behavioral:  The patient does not have insomnia.       Objective:     BP 102/64   Pulse 68   Temp 97.8 F (36.6 C) (Temporal)   Ht 5' 4 (1.626 m)   Wt 150 lb (68 kg)   LMP 12/15/2023   SpO2 98%   BMI 25.75 kg/m  BP Readings from Last 3 Encounters:  09/14/24 102/64  07/06/24 109/74  03/30/24 108/75   Wt Readings from Last 3 Encounters:  09/14/24 150 lb (68 kg)  03/30/24 155 lb 9.6 oz (70.6 kg)  02/10/24 161 lb 12.8 oz (73.4 kg)      Physical Exam Vitals reviewed.  Constitutional:      General: She is not in  acute distress.    Appearance: Normal appearance.  HENT:     Head: Normocephalic and atraumatic.  Cardiovascular:     Rate and Rhythm: Normal rate and regular rhythm.     Pulses: Normal pulses.     Heart sounds: Normal heart sounds.  Pulmonary:     Effort: Pulmonary effort is normal.     Breath sounds: Normal breath sounds.  Skin:    General: Skin is warm and dry.  Neurological:     General: No focal deficit present.     Mental Status: She is alert and oriented to person, place, and time.  Psychiatric:        Mood and Affect: Mood normal.        Behavior: Behavior normal.        Judgment: Judgment normal.      No results found for any visits on 09/14/24.    The 10-year ASCVD risk score (Arnett DK, et al., 2019) is: 1.2%    Assessment & Plan:   Assessment and Plan Assessment & Plan Menopausal symptoms with night sweats Night sweats likely due to perimenopause. Treatment options discussed including HRT, gabapentin , and veozah. Patient would like to trial gabapentin . If no improvement may consider going back to gynecologist to discuss HRT vs Veozah - Prescribed gabapentin  100 mg at bedtime - Discussed gabapentin  side effects: sedation, weight  gain. - Advised to monitor symptoms and adjust dosage.  Class 1 obesity BMI 25.75, Class 1 obesity. Previously on Wegovy , discontinued due to lack of insurance coverage. Discussed weight management options. She would like to restart wegovy  and use coupon to pay cash price.  - Prescribed Wegovy  0.25 mg weekly, increase to 0.5 mg. - Advised use of GoodRx or manufacturer coupons. - Discussed Wegovy  side effects and benefits.  Major depressive disorder, recurrent, moderate On Lexapro  and Wellbutrin .  - Refilled Lexapro  and Wellbutrin . Anxiety Managed with Xanax  as needed. - Refilled Xanax  prescription.     Return in about 3 months (around 12/13/2024) for CPE with Terra Aveni.    Lauraine FORBES Pereyra, NP

## 2024-09-14 NOTE — Assessment & Plan Note (Signed)
 Menopausal symptoms with night sweats Night sweats likely due to perimenopause. Treatment options discussed including HRT, gabapentin , and veozah. Patient would like to trial gabapentin . If no improvement may consider going back to gynecologist to discuss HRT vs Veozah - Prescribed gabapentin  100 mg at bedtime - Discussed gabapentin  side effects: sedation, weight gain. - Advised to monitor symptoms and adjust dosage.

## 2024-09-14 NOTE — Assessment & Plan Note (Signed)
 Major depressive disorder, recurrent, moderate On Lexapro  and Wellbutrin .  - Refilled Lexapro  and Wellbutrin . Anxiety Managed with Xanax  as needed. - Refilled Xanax  prescription.

## 2024-09-14 NOTE — Assessment & Plan Note (Signed)
 Class 1 obesity BMI 25.75, Class 1 obesity. Previously on Wegovy , discontinued due to lack of insurance coverage. Discussed weight management options. She would like to restart wegovy  and use coupon to pay cash price.  - Prescribed Wegovy  0.25 mg weekly, increase to 0.5 mg. - Advised use of GoodRx or manufacturer coupons. - Discussed Wegovy  side effects and benefits.

## 2024-09-18 ENCOUNTER — Other Ambulatory Visit (HOSPITAL_COMMUNITY): Payer: Self-pay

## 2024-09-18 ENCOUNTER — Telehealth: Payer: Self-pay

## 2024-09-18 NOTE — Telephone Encounter (Signed)
 Pharmacy Patient Advocate Encounter   Received notification from CoverMyMeds that prior authorization for Wegovy  0.25mg /0.76ml is required/requested.   Insurance verification completed.   The patient is insured through [facility] MEDICAID.   Per test claim: PA required; PA submitted to above mentioned insurance via Latent Key/confirmation #/EOC [facility] Status is pending

## 2024-09-19 NOTE — Telephone Encounter (Signed)
 Pharmacy Patient Advocate Encounter  Received notification from [facility] MEDICAID that Prior Authorization for Wegovy  0.25mg /0.48ml has been APPROVED from 09/18/24 to 09/18/25   PA #/Case ID/Reference #: [id number]

## 2024-09-26 ENCOUNTER — Encounter: Payer: Self-pay | Admitting: Physician Assistant

## 2024-09-26 ENCOUNTER — Ambulatory Visit: Admitting: Physician Assistant

## 2024-09-26 VITALS — BP 119/76 | HR 74

## 2024-09-26 DIAGNOSIS — D2339 Other benign neoplasm of skin of other parts of face: Secondary | ICD-10-CM | POA: Diagnosis not present

## 2024-09-26 DIAGNOSIS — D2272 Melanocytic nevi of left lower limb, including hip: Secondary | ICD-10-CM | POA: Diagnosis not present

## 2024-09-26 DIAGNOSIS — D489 Neoplasm of uncertain behavior, unspecified: Secondary | ICD-10-CM

## 2024-09-26 DIAGNOSIS — D239 Other benign neoplasm of skin, unspecified: Secondary | ICD-10-CM

## 2024-09-26 NOTE — Progress Notes (Signed)
" ° °  New Patient Visit   Subjective  Lindsey Sampson is a 43 y.o. female NEW PATIENT who presents for the following: Spot check   Patient states she has spot check located at the face and left upper thigh that she would like to have examined. Patient reports the areas have been there for 6 years for the place on her thigh and the place on her face was taken off about 15 years ago. Lesion on left thigh itches most of the time. This lesion has grown back and seems to be increasing in size.    The following portions of the chart were reviewed this encounter and updated as appropriate: medications, allergies, medical history  Review of Systems:  No other skin or systemic complaints except as noted in HPI or Assessment and Plan.  Objective  Well appearing patient in no apparent distress; mood and affect are within normal limits.  A focused examination was performed of the following areas: upper legs and face  Relevant exam findings are noted in the Assessment and Plan.  Left Thigh - Anterior 0.6 irregular brown mole     intradermal nevi- Face   Assessment & Plan   INTRADERMAL NEVUS - LEFT CHEEK  - discussed that this lesion is benign  - discussed that if we shave off it would likely come back. Recommend excision when and if she desires (Dr. Corey).   NEOPLASM OF UNCERTAIN BEHAVIOR Left Thigh - Anterior - Epidermal / dermal shaving  Lesion diameter (cm):  0.6 Informed consent: discussed and consent obtained   Timeout: patient name, date of birth, surgical site, and procedure verified   Procedure prep:  Patient was prepped and draped in usual sterile fashion Prep type:  Isopropyl alcohol Anesthesia: the lesion was anesthetized in a standard fashion   Anesthetic:  1% lidocaine  w/ epinephrine  1-100,000 buffered w/ 8.4% NaHCO3 Instrument used: flexible razor blade   Hemostasis achieved with: pressure, aluminum chloride and electrodesiccation   Outcome: patient tolerated procedure well    Post-procedure details: sterile dressing applied and wound care instructions given   Dressing type: bandage and petrolatum     Specimen 1 - Surgical pathology Differential Diagnosis: DN vs MM vs SK  Check Margins: No INTRADERMAL NEVUS    Return for waiting for  pathology .  I, Doyce Pan, CMA, am acting as scribe for Kataleah Bejar K, PA-C.   Documentation: I have reviewed the above documentation for accuracy and completeness, and I agree with the above.  Ivis Nicolson K, PA-C     "

## 2024-09-26 NOTE — Patient Instructions (Addendum)

## 2024-09-27 LAB — SURGICAL PATHOLOGY

## 2024-09-29 ENCOUNTER — Other Ambulatory Visit (HOSPITAL_COMMUNITY): Payer: Self-pay

## 2024-10-01 ENCOUNTER — Ambulatory Visit: Payer: Self-pay | Admitting: Physician Assistant

## 2024-10-08 ENCOUNTER — Other Ambulatory Visit: Payer: Self-pay | Admitting: Nurse Practitioner

## 2024-10-08 DIAGNOSIS — Z6830 Body mass index (BMI) 30.0-30.9, adult: Secondary | ICD-10-CM

## 2024-10-09 ENCOUNTER — Encounter: Payer: Self-pay | Admitting: Nurse Practitioner

## 2024-10-09 ENCOUNTER — Other Ambulatory Visit: Payer: Self-pay | Admitting: Nurse Practitioner

## 2024-10-09 DIAGNOSIS — Z6830 Body mass index (BMI) 30.0-30.9, adult: Secondary | ICD-10-CM

## 2024-10-09 MED ORDER — WEGOVY 0.5 MG/0.5ML ~~LOC~~ SOAJ: 0.5000 mg | SUBCUTANEOUS | 0 refills | Status: AC

## 2024-10-17 ENCOUNTER — Other Ambulatory Visit (HOSPITAL_COMMUNITY): Payer: Self-pay

## 2024-10-17 ENCOUNTER — Telehealth: Payer: Self-pay

## 2024-10-17 NOTE — Telephone Encounter (Signed)
 Pharmacy Patient Advocate Encounter   Received notification from CoverMyMeds that prior authorization for Wegovy  0.5mg /0.40ml is required/requested.   Insurance verification completed.   The patient is insured through [facility] MEDICAID.   Per test claim: Refill too soon. PA is not needed at this time. Medication was filled 10/14/24. Next eligible fill date is 11/03/24.   Previous prior authorization approval good until 09/18/25.   PA not submitted at this time. Key [id number] has been archived

## 2024-12-14 ENCOUNTER — Encounter: Admitting: Nurse Practitioner
